# Patient Record
Sex: Male | Born: 1935 | Race: White | Hispanic: No | Marital: Married | Smoking: Former smoker
Health system: Southern US, Community
[De-identification: ages and names within clinical notes are randomized; demographics above are authoritative.]

## PROBLEM LIST (undated history)

## (undated) DIAGNOSIS — Z96619 Presence of unspecified artificial shoulder joint: Secondary | ICD-10-CM

## (undated) DIAGNOSIS — E785 Hyperlipidemia, unspecified: Secondary | ICD-10-CM

## (undated) DIAGNOSIS — N400 Enlarged prostate without lower urinary tract symptoms: Secondary | ICD-10-CM

## (undated) DIAGNOSIS — E039 Hypothyroidism, unspecified: Secondary | ICD-10-CM

## (undated) DIAGNOSIS — I219 Acute myocardial infarction, unspecified: Secondary | ICD-10-CM

## (undated) DIAGNOSIS — M109 Gout, unspecified: Secondary | ICD-10-CM

## (undated) DIAGNOSIS — I350 Nonrheumatic aortic (valve) stenosis: Secondary | ICD-10-CM

## (undated) DIAGNOSIS — I2510 Atherosclerotic heart disease of native coronary artery without angina pectoris: Secondary | ICD-10-CM

## (undated) DIAGNOSIS — R079 Chest pain, unspecified: Secondary | ICD-10-CM

## (undated) DIAGNOSIS — I10 Essential (primary) hypertension: Secondary | ICD-10-CM

## (undated) HISTORY — DX: Gout, unspecified: M10.9

## (undated) HISTORY — DX: Acute myocardial infarction, unspecified: I21.9

## (undated) HISTORY — DX: Hyperlipidemia, unspecified: E78.5

## (undated) HISTORY — PX: CORONARY ARTERY BYPASS GRAFT: SHX141

## (undated) HISTORY — DX: Presence of unspecified artificial shoulder joint: Z96.619

## (undated) HISTORY — DX: Benign prostatic hyperplasia without lower urinary tract symptoms: N40.0

## (undated) HISTORY — DX: Nonrheumatic aortic (valve) stenosis: I35.0

## (undated) HISTORY — PX: PROSTATE ABLATION: SHX6042

## (undated) HISTORY — DX: Chest pain, unspecified: R07.9

## (undated) HISTORY — DX: Atherosclerotic heart disease of native coronary artery without angina pectoris: I25.10

## (undated) HISTORY — DX: Hypothyroidism, unspecified: E03.9

## (undated) HISTORY — PX: TONSILLECTOMY: SUR1361

## (undated) HISTORY — DX: Essential (primary) hypertension: I10

## (undated) HISTORY — PX: AORTIC VALVE REPLACEMENT: SHX41

---

## 2007-03-08 ENCOUNTER — Ambulatory Visit: Payer: Self-pay | Admitting: Cardiothoracic Surgery

## 2007-04-19 ENCOUNTER — Ambulatory Visit: Payer: Self-pay | Admitting: Thoracic Surgery (Cardiothoracic Vascular Surgery)

## 2008-06-29 ENCOUNTER — Ambulatory Visit: Payer: Self-pay | Admitting: Internal Medicine

## 2008-06-29 ENCOUNTER — Inpatient Hospital Stay (HOSPITAL_COMMUNITY): Admission: EM | Admit: 2008-06-29 | Discharge: 2008-07-01 | Payer: Self-pay | Admitting: Emergency Medicine

## 2008-07-01 ENCOUNTER — Ambulatory Visit: Payer: Self-pay | Admitting: Vascular Surgery

## 2008-07-01 ENCOUNTER — Encounter (INDEPENDENT_AMBULATORY_CARE_PROVIDER_SITE_OTHER): Payer: Self-pay | Admitting: Internal Medicine

## 2011-03-30 NOTE — H&P (Signed)
NAME:  Peter Beltran, Peter Beltran NO.:  [id number]   MEDICAL RECORD NO.:  [id number]          PATIENT TYPE:  INP   LOCATION:  2010                         FACILITY:  [facility]   PHYSICIAN:  Joylene John, MD       DATE OF BIRTH:  23-Jan-1925   DATE OF ADMISSION:  06/29/2008  DATE OF DISCHARGE:                              HISTORY & PHYSICAL   The patient belongs to [facility].   CHIEF COMPLAINT:  Diplopia for last 2-3 days, more pronounced since  noontime today.   HISTORY OF PRESENT ILLNESS:  This is an 77 year old white male with a  history of hypertension, hyperlipidemia, CABG, and TIA, coming in with  complaints of diplopia which started about 2-3 days ago.  According to  the patient, at that time his lens of his glasses had been broken, so he  attributed diplopia to that.  However, the glasses were fixed today and  the diplopia still persisted.  The patient denies any similar symptoms  in the past.  The patient denies any nausea, vomiting, shortness of  breath, or chest pain.  He did feel a little dizzy earlier before he  came to the ER today, however, that has since resolved.  The patient  denied any focal neurological weakness of any of his extremities.  No  speech problems or swallowing problems.   PAST MEDICAL HISTORY:  1. Hypertension.  2. Hyperlipidemia.  3. CABG x3 per the patient.  4. Aortic valve replacement, last year.  5. History of TIA in the past.   SOCIAL HISTORY:  The patient used to smoke in the past, now has quit for  several years.  Social drinker.  No IV or recreational drugs.   SURGICAL HISTORY:  CABG and aortic valve replacement.   ALLERGIES:  DOXYCYCLINE, gets a rash.   MEDICATIONS:  The patient cannot recall the complete med list today,  however, he recalls some of the medications, but not sure about the  doses.  The medications include the following:  1. Plavix 75 mg once daily.  2. Lotensin 40 mg x2 tablets once a  day.  3. Lipitor 40 mg daily.  4. Thyroxine 1 tablet daily, not sure about the dose.  5. Requip 1 tablet daily, not sure about the dose.  6. Singulair 1 tablet daily, not sure of the dose.  7. Spironolactone 1 tablet daily, not sure of the dose.  8. Aspirin 81 mg 1 tablet daily.   REVIEW OF SYSTEMS:  Please refer to the HPI for review of system.  All  other 14-point review of system was negative.   PHYSICAL EXAMINATION:  VITAL SIGNS:  Temperature was 97.3, pulse was 65,  respirations 18, blood pressure was 157/79, and O2 sat was 99% on room  air.  GENERAL:  The patient is in no acute distress.  He is alert and oriented  x3.  There is no slurring of speech.  LUNGS:  Clear to auscultation.  CARDIOVASCULAR:  Regular rate and rhythm.  There is a 2/6 murmur in left  sternal border.  No radiation.  EXTREMITIES:  Lower  extremities, no edema appreciated.  NEUROLOGIC:  The cranial nerves are grossly intact.  There is no  nystagmus noted of the eyes when tested.  Upper and lower extremity  bilateral strength 5/5.  Also, the bilateral IOL reflex was intact upon  eye exam.  No focal neurological deficit found on exam.  Romberg sign  was negative.   </PERTINENT LABS and imaging>  White count of 5.5, hemoglobin 12.1, hematocrit 35.6, and platelets 191.  Sodium 135, potassium 4.9, chloride 105, bicarb 26, BUN 33, creatinine  1.2, glucose 95, and INR 1.  CT head showed chronic small vessel  disease.  No acute pathology appreciated.   ASSESSMENT AND PLAN:  This is an 77 year old white male with multiple  risk factors coming in with diplopia and a possible stroke with a  negative head imaging.  Plan is to admit the patient, put him on a tele  bed, and do serial cardiac enzymes to make sure this is not a cardiac  event.  We will continue the aspirin and Plavix.  We will start him on a  low-dose beta-blocker 25 q.12 h. with hold parameters.  We will also  order echo and Dopplers in the morning to  see if there is a source for  the stroke in the heart or the carotids.  The patient has been asked to  bring all his medications in the morning, so that doses can be confirmed  until then we will hold the Lotensin, thyroxine, Requip, Singulair, and  spironolactone.      Joylene John, MD  Electronically Signed     RP/MEDQ  D:  06/29/2008  T:  06/30/2008  Job:  [id number]

## 2011-04-02 NOTE — Discharge Summary (Signed)
NAME:  Peter Beltran, Peter Beltran NO.:  [id number]   MEDICAL RECORD NO.:  [id number]          PATIENT TYPE:  INP   LOCATION:  2010                         FACILITY:  [facility]   PHYSICIAN:  Deirdre Peer. Polite, M.D. DATE OF BIRTH:  11-13-1925   DATE OF ADMISSION:  06/29/2008  DATE OF DISCHARGE:  07/01/2008                               DISCHARGE SUMMARY   DISCHARGE DIAGNOSES:  1. Transient ischemic attack characterized diplopia.  2. Coronary artery disease status post coronary artery bypass graft in      2008 status post pulmonary vascular resistance with a pulmonary      problem.  3. Asthma.  4. Hypertension.  5. History of transient ischemic attack 2 years ago, followed by      Neurology at [facility].  6. High cholesterol.  7. Glaucoma.  8. Gout.  9. History of pelvic fracture in 2006.  10.History of testosterone deficiency.  11.Hearing loss.  12.Gastroesophageal reflux disease.  13.Hypothyroidism.  14.Moderate-to-severe aortic valve stenosis, mean transaortic valve      gradient 34, estimated aortic valve area was 0.98.   DISCHARGE MEDICATIONS:  1. Metoprolol 25 mg daily.  2. Lipitor 40 mg daily.  3. Xalatan solution.  4. Benazepril 40 mg daily.  5. Singulair 10 mg daily.  6. Proventil HFA p.r.n.  7. Spironolactone 25 mg daily.  8. Plavix 75 mg daily.  9. Allopurinol 20 mg daily.  10.Ropinirole 2-3 times a day.  11.AndroGel daily.  12.Synthroid 75 mcg.  13.Aspirin daily.   DISPOSITION:  The patient discharged to home in stable condition.  The  patient asked to follow up with his established cardiologist in [facility], and his established neurologist in [place].  The patient is  asked to follow up with his primary MD as well.   STUDIES:  2-D echo:  Overall LV systolic function was normal.  Findings  were consistent with moderate-to-severe aortic valve stenosis.  Main  transaortic valve gradient was 34 mmHg.  Estimated aortic valve area was  0.98.  MRI of  brain:  No acute intracranial abnormality, moderate  nonspecific cerebral white matter disease most commonly related to small  vessel ischemia.  MRA:  Moderate-to-severe stenosis of the right  posterior cerebral artery, incidental fetal origin with preserved distal  flow, otherwise unremarkable.  Posterior circulation, mild stenosis of  the right MCA origin, otherwise unremarkable.  CT of the head:  No acute  intracranial abnormality, evidence of advanced chronic small-vessel  ischemic disease.  Carotid ultrasound:  Bilateral moderate plaque, no  ICA stenosis.  Total cholesterol 173, HDL 27, CK normal, and troponin I  normal.   HISTORY OF PRESENT ILLNESS:  Elderly male presented to the ED with  complaint of diplopia times 2-3 days, his story seems to fluctuate  during the admission initially because his glasses would broke, however,  after getting his glasses fixed he still had blurred vision.  Because of  history of TIA, the patient presented to the ED for evaluation.  He  denied any chest pain or shortness of breath.  Please see H&P for  further details.   PAST  MEDICAL HISTORY:  As stated above including hypertension,  hyperlipidemia, CABG, aortic valve replacement, and history of TIA.   ADMISSION MEDICATIONS:  As stated above.   SOCIAL HISTORY:  Per admission H&P.   PAST SURGICAL HISTORY:  As stated in H&P.   HOSPITAL COURSE:  The patient was admitted to a telemetry floor bed for  evaluation and treatment for possible TIA.  The patient was without any  neuro deficits when presented to the ED or during the hospital.  His  neuro exam was within normal limits.  The patient has several studies as  stated above, CAT scan of the head, MRI, MRA, carotid Doppler, 2-D echo,  there was no arrhythmia noted on the monitor.  The patient's old records  were reviewed in the office.  There was not much change in this prior  carotid study.  Left carotid showed bifurcation and mild irregular   heterogeneously calcified plaque and his internal carotid artery has  mild irregular heterogeneously calcified plaque.  The right carotid  showed normal velocities, irregular heterogeneously calcified plaque.  The patient's previous echo also was reviewed which showed preserved EF.  At the time of the patient's physical in June 2009, he was asked to  follow up with his cardiologist and his neurologist in [facility], which  he states he has been doing.  As stated, the patient was admitted for  TIA MRI without any acute stroke.  The patient will continue current  meds.  He will continue with followup by his established neurologist and  cardiologist in [facility].      Deirdre Peer. Polite, M.D.  Electronically Signed     RDP/MEDQ  D:  07/02/2008  T:  07/03/2008  Job:  [id number]

## 2011-05-17 ENCOUNTER — Encounter: Payer: Self-pay | Admitting: Cardiology

## 2011-06-14 ENCOUNTER — Encounter (INDEPENDENT_AMBULATORY_CARE_PROVIDER_SITE_OTHER): Payer: Medicare Other

## 2011-06-14 DIAGNOSIS — I359 Nonrheumatic aortic valve disorder, unspecified: Secondary | ICD-10-CM

## 2011-06-15 NOTE — Consult Note (Signed)
[facility] NEW PATIENT CONSULTATION  Peter Beltran, Peter Beltran DOB:  Oct 15, 1925                                        June 14, 2011 CHART #:  [id number]  REQUESTING PHYSICIAN:  Montez Morita MD, [facility].  PRIMARY CARE PHYSICIAN:  Dr. Beckey Downing.  REASON FOR CONSULTATION:  Bioprosthetic aortic stenosis status port AVR in 2008.  CHIEF COMPLAINT:  Shortness of breath and chest pain with exertion.  HISTORY OF PRESENT ILLNESS:  I was asked to evaluate this 77 year old Caucasian male, nonsmoker for possible redo aortic valve replacement after being diagnosed with prosthetic aortic valve stenosis by transthoracic 2-D echo as well as transesophageal 2-D echo.  The patient presented with unstable angina in early 2008 and cardiac cath demonstrated severe three-vessel coronary artery disease with moderate aortic stenosis with aortic valve area of 0.9 and a gradient of 40 mmHg. The patient underwent aortic valve replacement with a 21-mm of Mitroflow bioprosthetic valve, which functioned normally on the intraoperative echo without aortic insufficiency and minimal transvalvular gradient. The LV outflow tract measures approximately 19 mm.  The patient also underwent simultaneous left IMA graft to the LAD and vein grafts to the circumflex marginal and posterior descending.  The patient did well for several years.  Approximately 2-3 months ago, he developed exertional symptoms of shortness breath and chest pain.  He currently uses a scooter to get around.  A 2-D echo performed in 2011 showed a mean gradient of 35 mmHg with aortic valve area of 1.6.  The most recent transthoracic echo now shows a mean gradient of 65 with an aortic valve area of 0.7.  Peak RV systolic pressure is currently 45 mmHg.  The patient denies any orthopnea or resting symptoms.  He denies syncope or palpitation.  He has had some dizziness.  If he watches his activity, he is asymptomatic.  He  can walk up a flight of stairs, but has some difficulty.  The patient underwent evaluation of this symptom complex and a stress test showed no evidence of ischemia using the Lexiscan nuclear medicine myocardial perfusion scan.  He presents now for evaluation of the situation.  Since the surgery, the patient has had no evidence of endocarditis or other significant medical events or admissions.  His LVEF by echo is on normal and there is no other significant valvular abnormalities, no evidence of mitral regurgitation, tricuspid insufficiency, or pericardial effusion.  CURRENT MEDICATIONS: 1. Allopurinol 300 mg a day. 2. Aspirin 81 mg a day. 3. Aleve 200 mg a day. 4. Clonazepam 1 mg nightly. 5. Neurontin 300 mg nightly. 6. Levothyroxine 75 mcg daily. 7. Metoprolol 50 mg daily. 8. Plavix 75 mg a day. 9. Spirolactone 25 mg a day. 10.Xalatan eye drops. 11.Omeprazole 20 mg a day.  ALLERGIES:  He develops a red rash from VANCOMYCIN and has myopathy with LIPITOR.  PAST MEDICAL HISTORY: 1. AVR/CABG in 2008. 2. Right shoulder replacement. 3. Appendectomy. 4. Hypertension. 5. Hypothyroidism. 6. Restless legs syndrome. 7. Hyperlipidemia.  SOCIAL HISTORY:  The patient is retired and lives with his wife.  He is a nonsmoker and does not drink alcohol.  He is sedentary due to his exertional-related symptoms, but uses a scooter.  REVIEW OF SYSTEMS:  Negative for fever or weight loss.  He is edentulous with total upper and lower dental plates.  He denies difficulty swallowing.  He denies any falls or thoracic trauma.  He denies GI bleeding.  He does have some BPH with mild symptoms.  PHYSICAL EXAMINATION:  VITAL SIGNS:  The patient is 5 feet and 6 inches and weighs 222 pounds.  Blood pressure 140/70.  Pulse 60 and regular. GENERAL APPEARANCE:  That of an elderly Caucasian male, appears somewhat younger than his stated age of 40 years. HEENT:  Normocephalic.  Pupils are equal.   Dentition is with plates. NECK:  Without JVD.  Carotid pulses are somewhat diminished. LYMPHATICS:  No palpable adenopathy. CHEST:  Thorax is without deformity with well-healed sternal incision. Breath sounds are clear and equally.  He has a soft systolic flow murmur through the aortic valve.  He is in sinus rhythm. ABDOMEN:  Soft and nontender. EXTREMITIES:  No clubbing, tenderness, or edema.  Peripheral pulses are intact. NEUROLOGIC:  Nonfocal.  LABORATORY DATA:  I reviewed his several echoes which have shown gradual increase in transvalvular aortic gradient and possible prosthetic valve deterioration.  He has mild aortic insufficiency.  PLAN:  I would advise that the patient undergo repeat right and left heart cath with a pullback pressure gradient across the aortic valve. The patient may need aortic valve replacement either with conventional repeat sternotomy or perhaps he would be a candidate for a transarterial aortic valve replacement (TAVR).  I will discuss the situation with Cardiology, Dr. Harriette Bouillon and arrange for his catheterization as the initial step.  Kerin Perna, M.D. Electronically Signed  PV/MEDQ  D:  06/14/2011  T:  06/15/2011  Job:  [id number]

## 2011-07-06 ENCOUNTER — Encounter (INDEPENDENT_AMBULATORY_CARE_PROVIDER_SITE_OTHER): Payer: Medicare Other

## 2011-07-06 DIAGNOSIS — I359 Nonrheumatic aortic valve disorder, unspecified: Secondary | ICD-10-CM

## 2011-07-27 DIAGNOSIS — I359 Nonrheumatic aortic valve disorder, unspecified: Secondary | ICD-10-CM

## 2011-08-24 ENCOUNTER — Encounter: Payer: Self-pay | Admitting: Surgery

## 2011-08-25 ENCOUNTER — Ambulatory Visit (INDEPENDENT_AMBULATORY_CARE_PROVIDER_SITE_OTHER): Payer: Medicare Other | Admitting: Thoracic Surgery (Cardiothoracic Vascular Surgery)

## 2011-08-25 DIAGNOSIS — I359 Nonrheumatic aortic valve disorder, unspecified: Secondary | ICD-10-CM

## 2011-08-25 DIAGNOSIS — Z952 Presence of prosthetic heart valve: Secondary | ICD-10-CM

## 2011-08-25 NOTE — Progress Notes (Signed)
He is doing quite well since discharge. His sternum is stable, wounds are well healed. His shortness of breath is quite improved. He has seen his cardiologist. He is walking for exercise and will begin heartstrides in a couple of weeks. Sternal precautions were reviewed. RTC PRN.

## 2012-08-15 DIAGNOSIS — R7881 Bacteremia: Secondary | ICD-10-CM

## 2012-08-15 HISTORY — DX: Bacteremia: R78.81

## 2013-04-21 DIAGNOSIS — R7301 Impaired fasting glucose: Secondary | ICD-10-CM | POA: Insufficient documentation

## 2013-04-21 DIAGNOSIS — J449 Chronic obstructive pulmonary disease, unspecified: Secondary | ICD-10-CM

## 2013-04-21 DIAGNOSIS — R1310 Dysphagia, unspecified: Secondary | ICD-10-CM

## 2013-04-21 DIAGNOSIS — M1990 Unspecified osteoarthritis, unspecified site: Secondary | ICD-10-CM | POA: Insufficient documentation

## 2013-04-21 DIAGNOSIS — K117 Disturbances of salivary secretion: Secondary | ICD-10-CM | POA: Insufficient documentation

## 2013-04-21 HISTORY — DX: Disturbances of salivary secretion: K11.7

## 2013-04-21 HISTORY — DX: Dysphagia, unspecified: R13.10

## 2013-04-21 HISTORY — DX: Chronic obstructive pulmonary disease, unspecified: J44.9

## 2013-04-21 HISTORY — DX: Impaired fasting glucose: R73.01

## 2013-04-21 HISTORY — DX: Unspecified osteoarthritis, unspecified site: M19.90

## 2013-05-12 ENCOUNTER — Emergency Department (HOSPITAL_BASED_OUTPATIENT_CLINIC_OR_DEPARTMENT_OTHER)
Admission: EM | Admit: 2013-05-12 | Discharge: 2013-05-12 | Disposition: A | Discharge status: Home / Self Care | Payer: Medicare Other | Attending: Emergency Medicine | Admitting: Emergency Medicine

## 2013-05-12 ENCOUNTER — Encounter (HOSPITAL_BASED_OUTPATIENT_CLINIC_OR_DEPARTMENT_OTHER): Payer: Self-pay | Admitting: *Deleted

## 2013-05-12 DIAGNOSIS — E039 Hypothyroidism, unspecified: Secondary | ICD-10-CM | POA: Insufficient documentation

## 2013-05-12 DIAGNOSIS — Z954 Presence of other heart-valve replacement: Secondary | ICD-10-CM | POA: Insufficient documentation

## 2013-05-12 DIAGNOSIS — Z7982 Long term (current) use of aspirin: Secondary | ICD-10-CM | POA: Insufficient documentation

## 2013-05-12 DIAGNOSIS — Z87448 Personal history of other diseases of urinary system: Secondary | ICD-10-CM | POA: Insufficient documentation

## 2013-05-12 DIAGNOSIS — Z96619 Presence of unspecified artificial shoulder joint: Secondary | ICD-10-CM | POA: Insufficient documentation

## 2013-05-12 DIAGNOSIS — G249 Dystonia, unspecified: Secondary | ICD-10-CM

## 2013-05-12 DIAGNOSIS — Z7901 Long term (current) use of anticoagulants: Secondary | ICD-10-CM | POA: Insufficient documentation

## 2013-05-12 DIAGNOSIS — M109 Gout, unspecified: Secondary | ICD-10-CM | POA: Insufficient documentation

## 2013-05-12 DIAGNOSIS — Z79899 Other long term (current) drug therapy: Secondary | ICD-10-CM | POA: Insufficient documentation

## 2013-05-12 DIAGNOSIS — Z8679 Personal history of other diseases of the circulatory system: Secondary | ICD-10-CM | POA: Insufficient documentation

## 2013-05-12 DIAGNOSIS — R259 Unspecified abnormal involuntary movements: Secondary | ICD-10-CM | POA: Insufficient documentation

## 2013-05-12 DIAGNOSIS — Z862 Personal history of diseases of the blood and blood-forming organs and certain disorders involving the immune mechanism: Secondary | ICD-10-CM | POA: Insufficient documentation

## 2013-05-12 DIAGNOSIS — Z8639 Personal history of other endocrine, nutritional and metabolic disease: Secondary | ICD-10-CM | POA: Insufficient documentation

## 2013-05-12 DIAGNOSIS — G2581 Restless legs syndrome: Secondary | ICD-10-CM | POA: Insufficient documentation

## 2013-05-12 DIAGNOSIS — IMO0002 Reserved for concepts with insufficient information to code with codable children: Secondary | ICD-10-CM | POA: Insufficient documentation

## 2013-05-12 DIAGNOSIS — I2510 Atherosclerotic heart disease of native coronary artery without angina pectoris: Secondary | ICD-10-CM | POA: Insufficient documentation

## 2013-05-12 DIAGNOSIS — I10 Essential (primary) hypertension: Secondary | ICD-10-CM | POA: Insufficient documentation

## 2013-05-12 MED ORDER — ZOLPIDEM TARTRATE 5 MG PO TABS: 5.0000 mg | ORAL_TABLET | Freq: Every evening | ORAL | Status: DC | PRN

## 2013-05-12 NOTE — ED Provider Notes (Signed)
History     79 year old male with restlessness. His sensation that he just cannot sit still. He feels like he is to be moving at all times. Symptoms are worse at night. Onto the symphysis about 2 days ago. We'll her symptoms is trying mouth. No recent medication changes. Patient did try stopping all his medications from his Coumadin thinking that his symptoms may be related. His son is in the past day. No appreciable change. No fevers or chills. No headaches. No acute numbness, tingling or loss of strength. No confusion per wife. Patient has a history restless leg syndrome, but states that current symptoms are much more severe and involve more than just legs.   CSN: [id number] Arrival date & time 05/12/13  1113  None    Chief Complaint  Patient presents with  . Spasms   (Consider location/radiation/quality/duration/timing/severity/associated sxs/prior Treatment) HPI Past Medical History  Diagnosis Date  . Aortic stenosis     post aortic valve replacement with St.Jude's valve by Dr.VanTrigt on 07-27-11  . Coronary artery disease     status post CABG x3 in 2008  . Hypertension   . Hypothyroidism   . Dyslipidemia   . Hyperplasia of prostate     benign  . Status post shoulder joint replacement     right shoulder  . Gout    Past Surgical History  Procedure Laterality Date  . Prostate ablation     No family history on file. History  Substance Use Topics  . Smoking status: Not on file  . Smokeless tobacco: Not on file  . Alcohol Use: Not on file    Review of Systems  All systems reviewed and negative, other than as noted in HPI.   Allergies  Lipitor and Vancomycin  Home Medications   Current Outpatient Rx  Name  Route  Sig  Dispense  Refill  . fluticasone-salmeterol (ADVAIR HFA) 115-21 MCG/ACT inhaler   Inhalation   Inhale 2 puffs into the lungs 2 (two) times daily.         Marland Kitchen testosterone (ANDROGEL) 50 MG/5GM GEL   Transdermal   Place 5 g onto the skin  daily.         Marland Kitchen allopurinol (ZYLOPRIM) 300 MG tablet   Oral   Take 300 mg by mouth daily.           Marland Kitchen aspirin 81 MG tablet   Oral   Take 81 mg by mouth daily.           . clonazePAM (KLONOPIN) 1 MG tablet   Oral   Take 1 mg by mouth at bedtime.           . Gabapentin (NEURONTIN PO)   Oral   Take 300 mg by mouth as needed.           Marland Kitchen levothyroxine (SYNTHROID) 75 MCG tablet   Oral   Take 75 mcg by mouth daily.           . metoprolol (LOPRESSOR) 50 MG tablet   Oral   Take 50 mg by mouth 2 (two) times daily.           Marland Kitchen omeprazole (PRILOSEC) 20 MG capsule   Oral   Take 20 mg by mouth daily.           Marland Kitchen rOPINIRole (REQUIP) 2 MG tablet      2 mg p.o. At dinner time and 4 mg p.o. At bedtime          .  spironolactone (ALDACTONE) 25 MG tablet   Oral   Take 25 mg by mouth daily.           . traMADol (ULTRAM) 50 MG tablet      1 or 2 every 4 hours as needed for pain          . warfarin (COUMADIN) 2.5 MG tablet      2 tablets p.o. daily or as directed           BP 171/87  Pulse 73  Temp(Src) 97.6 F (36.4 C) (Oral)  Resp 18  SpO2 98% Physical Exam  Nursing note and vitals reviewed. Constitutional: He is oriented to person, place, and time. He appears well-developed and well-nourished. No distress.  Standing beside bed pacing.   HENT:  Head: Normocephalic and atraumatic.  Eyes: Conjunctivae are normal. Right eye exhibits no discharge. Left eye exhibits no discharge.  Neck: Neck supple.  Cardiovascular: Normal rate, regular rhythm and normal heart sounds.  Exam reveals no gallop and no friction rub.   No murmur heard. Pulmonary/Chest: Effort normal and breath sounds normal. No respiratory distress.  Abdominal: Soft. He exhibits no distension. There is no tenderness.  Musculoskeletal: He exhibits no edema and no tenderness.  Neurological: He is alert and oriented to person, place, and time. No cranial nerve deficit. He exhibits normal  muscle tone. Coordination normal.  Mild resting tremor. No increased muscle tone. No cog wheeling. Biceps and patellar reflexes normal.   Skin: Skin is warm and dry. He is not diaphoretic.  Psychiatric: He has a normal mood and affect. His behavior is normal. Thought content normal.    ED Course  Procedures (including critical care time) Labs Reviewed - No data to display No results found. 1. Dystonic movements     MDM  79 year old male with dystonic muscle movements. Suspect medication related. Patient is taking her oral for restless leg syndrome. This can actually cause dystonic movements though as well as dry mouth. Recommended stopping for a few days. Double evening klonopin dose temporarily as well. Pt cautioned about sedating effects. Outpt FU.   Raeford Razor, MD 05/15/13 [id number]

## 2013-05-12 NOTE — ED Notes (Addendum)
Patient states that he cannot sit still. He cant sleep and he feels like he must be moving at all times. Takes medications to help him sleep but nothing is working. States that his mouth is drier than usual and that has more gas than usual.

## 2013-05-29 DIAGNOSIS — M109 Gout, unspecified: Secondary | ICD-10-CM | POA: Insufficient documentation

## 2013-05-29 DIAGNOSIS — I359 Nonrheumatic aortic valve disorder, unspecified: Secondary | ICD-10-CM | POA: Insufficient documentation

## 2013-05-29 DIAGNOSIS — N401 Enlarged prostate with lower urinary tract symptoms: Secondary | ICD-10-CM | POA: Insufficient documentation

## 2013-05-29 DIAGNOSIS — Z7901 Long term (current) use of anticoagulants: Secondary | ICD-10-CM | POA: Insufficient documentation

## 2013-05-29 HISTORY — DX: Long term (current) use of anticoagulants: Z79.01

## 2013-05-29 HISTORY — DX: Nonrheumatic aortic valve disorder, unspecified: I35.9

## 2013-05-29 HISTORY — DX: Benign prostatic hyperplasia with lower urinary tract symptoms: N40.1

## 2013-05-29 HISTORY — DX: Gout, unspecified: M10.9

## 2013-06-12 ENCOUNTER — Emergency Department (HOSPITAL_COMMUNITY)
Admission: EM | Admit: 2013-06-12 | Discharge: 2013-06-12 | Disposition: A | Discharge status: Home / Self Care | Payer: Medicare Other | Attending: Emergency Medicine | Admitting: Emergency Medicine

## 2013-06-12 ENCOUNTER — Encounter (HOSPITAL_COMMUNITY): Payer: Self-pay | Admitting: Emergency Medicine

## 2013-06-12 DIAGNOSIS — Z862 Personal history of diseases of the blood and blood-forming organs and certain disorders involving the immune mechanism: Secondary | ICD-10-CM | POA: Insufficient documentation

## 2013-06-12 DIAGNOSIS — Z8679 Personal history of other diseases of the circulatory system: Secondary | ICD-10-CM | POA: Insufficient documentation

## 2013-06-12 DIAGNOSIS — Z7982 Long term (current) use of aspirin: Secondary | ICD-10-CM | POA: Insufficient documentation

## 2013-06-12 DIAGNOSIS — Z951 Presence of aortocoronary bypass graft: Secondary | ICD-10-CM | POA: Insufficient documentation

## 2013-06-12 DIAGNOSIS — Z79899 Other long term (current) drug therapy: Secondary | ICD-10-CM | POA: Insufficient documentation

## 2013-06-12 DIAGNOSIS — G249 Dystonia, unspecified: Secondary | ICD-10-CM

## 2013-06-12 DIAGNOSIS — I10 Essential (primary) hypertension: Secondary | ICD-10-CM | POA: Insufficient documentation

## 2013-06-12 DIAGNOSIS — R259 Unspecified abnormal involuntary movements: Secondary | ICD-10-CM | POA: Insufficient documentation

## 2013-06-12 DIAGNOSIS — Z87448 Personal history of other diseases of urinary system: Secondary | ICD-10-CM | POA: Insufficient documentation

## 2013-06-12 DIAGNOSIS — I2510 Atherosclerotic heart disease of native coronary artery without angina pectoris: Secondary | ICD-10-CM | POA: Insufficient documentation

## 2013-06-12 DIAGNOSIS — E039 Hypothyroidism, unspecified: Secondary | ICD-10-CM | POA: Insufficient documentation

## 2013-06-12 DIAGNOSIS — Z7901 Long term (current) use of anticoagulants: Secondary | ICD-10-CM | POA: Insufficient documentation

## 2013-06-12 DIAGNOSIS — Z96619 Presence of unspecified artificial shoulder joint: Secondary | ICD-10-CM | POA: Insufficient documentation

## 2013-06-12 DIAGNOSIS — Z8639 Personal history of other endocrine, nutritional and metabolic disease: Secondary | ICD-10-CM | POA: Insufficient documentation

## 2013-06-12 DIAGNOSIS — M109 Gout, unspecified: Secondary | ICD-10-CM | POA: Insufficient documentation

## 2013-06-12 LAB — CBC WITH DIFFERENTIAL/PLATELET
Eosinophils Absolute: 0.5 10*3/uL (ref 0.0–0.7)
Eosinophils Relative: 7 % — ABNORMAL HIGH (ref 0–5)
HCT: 39 % (ref 39.0–52.0)
Lymphs Abs: 2 10*3/uL (ref 0.7–4.0)
MCH: 28.1 pg (ref 26.0–34.0)
MCV: 87.1 fL (ref 78.0–100.0)
Monocytes Absolute: 0.7 10*3/uL (ref 0.1–1.0)
Monocytes Relative: 10 % (ref 3–12)
Platelets: 154 10*3/uL (ref 150–400)
RBC: 4.48 MIL/uL (ref 4.22–5.81)

## 2013-06-12 LAB — COMPREHENSIVE METABOLIC PANEL
BUN: 19 mg/dL (ref 6–23)
CO2: 24 mEq/L (ref 19–32)
Calcium: 9.2 mg/dL (ref 8.4–10.5)
Creatinine, Ser: 0.89 mg/dL (ref 0.50–1.35)
GFR calc Af Amer: 87 mL/min — ABNORMAL LOW (ref >90)
GFR calc non Af Amer: 75 mL/min — ABNORMAL LOW (ref >90)
Glucose, Bld: 103 mg/dL — ABNORMAL HIGH (ref 70–99)
Sodium: 139 mEq/L (ref 135–145)
Total Protein: 6.7 g/dL (ref 6.0–8.3)

## 2013-06-12 NOTE — ED Provider Notes (Signed)
CSN: [id number]     Arrival date & time 06/12/13  0428 History     First MD Initiated Contact with Patient 06/12/13 [phone]     Chief Complaint  Patient presents with  . Spasms   (Consider location/radiation/quality/duration/timing/severity/associated sxs/prior Treatment) HPI Comments: Peter Beltran is a 79 yo male with a PMH of HTN, CAD, RLS/Periodic Limb Movement Disorder who presents for a 5 hour history of increased restlessness/limb movement.  His wife is present for the entire H&P and provided some of the history.  He was recently seen at [facility] for the same complaint.  He otherwise has no complaints and denies headache, fever, CP/SOB or dysuria.     Past Medical History  Diagnosis Date  . Aortic stenosis     post aortic valve replacement with St.Jude's valve by Dr.VanTrigt on 07-27-11  . Coronary artery disease     status post CABG x3 in 2008  . Hypertension   . Hypothyroidism   . Dyslipidemia   . Hyperplasia of prostate     benign  . Status post shoulder joint replacement     right shoulder  . Gout    Past Surgical History  Procedure Laterality Date  . Prostate ablation     History reviewed. No pertinent family history. History  Substance Use Topics  . Smoking status: Not on file  . Smokeless tobacco: Not on file  . Alcohol Use: Not on file    Review of Systems  Constitutional: Negative for fever and appetite change.  Respiratory: Negative for shortness of breath.   Cardiovascular: Negative for chest pain.  Gastrointestinal: Negative for diarrhea, constipation and blood in stool.  Genitourinary: Negative for dysuria and hematuria.  Psychiatric/Behavioral: Negative for agitation. The patient is hyperactive. The patient is not nervous/anxious.     Allergies  Lipitor and Vancomycin  Home Medications   Current Outpatient Rx  Name  Route  Sig  Dispense  Refill  . allopurinol (ZYLOPRIM) 300 MG tablet   Oral   Take 300 mg by mouth daily.           Marland Kitchen aspirin 81 MG tablet   Oral   Take 81 mg by mouth daily.           . clonazePAM (KLONOPIN) 1 MG tablet   Oral   Take 1 mg by mouth at bedtime.           . fluticasone-salmeterol (ADVAIR HFA) 115-21 MCG/ACT inhaler   Inhalation   Inhale 2 puffs into the lungs 2 (two) times daily.         Marland Kitchen levothyroxine (SYNTHROID) 75 MCG tablet   Oral   Take 75 mcg by mouth daily.           . Melatonin 10 MG CAPS   Oral   Take 10 mg by mouth at bedtime.         . metoprolol (LOPRESSOR) 50 MG tablet   Oral   Take 50 mg by mouth daily.          Marland Kitchen omeprazole (PRILOSEC) 20 MG capsule   Oral   Take 20 mg by mouth daily.           Marland Kitchen rOPINIRole (REQUIP) 2 MG tablet      2 mg p.o. At dinner time and 4 mg p.o. At bedtime          . spironolactone (ALDACTONE) 25 MG tablet   Oral   Take 25 mg by mouth  daily.           . testosterone (ANDROGEL) 50 MG/5GM GEL   Transdermal   Place 5 g onto the skin daily.         . traMADol (ULTRAM) 50 MG tablet      1 or 2 every 4 hours as needed for pain          . warfarin (COUMADIN) 5 MG tablet   Oral   Take 5 mg by mouth daily.          BP 159/97  Pulse 69  Temp(Src) 97.8 F (36.6 C) (Oral)  Resp 20  SpO2 97% Physical Exam  Constitutional: He is oriented to person, place, and time. He appears well-developed and well-nourished. No distress.  HENT:  Mouth/Throat: Oropharynx is clear and moist.  Eyes: Pupils are equal, round, and reactive to light.  Pinpoint pupils  Neck: Neck supple.  Cardiovascular: Normal rate, regular rhythm and normal heart sounds.   Pulmonary/Chest: Effort normal and breath sounds normal.  Abdominal: Soft. Bowel sounds are normal. He exhibits no distension. There is no tenderness. There is no guarding.  Musculoskeletal: He exhibits no edema and no tenderness.  Restless activity. Moving extremities around in the bed.  Neurological: He is alert and oriented to person, place, and time. No  cranial nerve deficit.  Normal gait  Psychiatric: He has a normal mood and affect. His behavior is normal.    ED Course   Procedures (including critical care time)  Labs Reviewed - No data to display No results found. No diagnosis found.  MDM  79 yo with RLS/periodic limb movement here with increasing upper and lower extremity movement since early this AM.    Will obtain CBC, CMP and consult neurology.   Pt symptoms have resolved in the ED.  Dr. Roseanne Reno (neurology) suggests increasing Klonopin dose for 1mg  to 1.5mg .  Awaiting labs and likely discharge if normal.  Pt says he usually only take 0.5mg  Klonopin so will increase to 1mg .   He was instructed to follow up with Dr. Alton Revere (his neurologist) at his scheduled appointment next week (Aug 5).  He was instructed to return to the ED with increased symptoms.  Evelena Peat, DO 06/12/13 1107

## 2013-06-12 NOTE — ED Provider Notes (Signed)
I saw and evaluated the patient, reviewed the resident's note and I agree with the findings and plan.   .Face to face Exam:  General:  Awake HEENT:  Atraumatic Resp:  Normal effort Abd:  Nondistended Neuro:No focal weakness  Nelia Shi, MD 06/12/13 1112

## 2013-06-12 NOTE — ED Notes (Signed)
Pt resting quietly at the time. Vital signs stable. He is alert and answering questions appropriately at the time. Pt updated on plan of care. Wife at bedside.

## 2013-06-12 NOTE — ED Notes (Signed)
Pt requesting some food. Breakfast ordered.

## 2013-06-12 NOTE — ED Notes (Signed)
Pt resting quietly at the time. Wife states recent leg spasms and difficulty sleeping at night. No other complaints at the time. Pt is alert, denies pain at the time.

## 2013-06-12 NOTE — ED Notes (Signed)
Brought in by EMS from an independent condo living facility with "jerking" he was seen at [facility] last night and was upset because he said they didn't do anything for him

## 2013-06-14 DIAGNOSIS — R55 Syncope and collapse: Secondary | ICD-10-CM | POA: Insufficient documentation

## 2013-06-14 DIAGNOSIS — G4710 Hypersomnia, unspecified: Secondary | ICD-10-CM | POA: Insufficient documentation

## 2013-06-14 HISTORY — DX: Hypersomnia, unspecified: G47.10

## 2013-06-14 HISTORY — DX: Syncope and collapse: R55

## 2013-08-26 DIAGNOSIS — G2581 Restless legs syndrome: Secondary | ICD-10-CM

## 2013-08-26 DIAGNOSIS — G4761 Periodic limb movement disorder: Secondary | ICD-10-CM | POA: Insufficient documentation

## 2013-08-26 DIAGNOSIS — IMO0001 Reserved for inherently not codable concepts without codable children: Secondary | ICD-10-CM

## 2013-08-26 HISTORY — DX: Periodic limb movement disorder: G47.61

## 2013-08-26 HISTORY — DX: Restless legs syndrome: G25.81

## 2013-08-26 HISTORY — DX: Reserved for inherently not codable concepts without codable children: IMO0001

## 2013-09-17 DIAGNOSIS — F419 Anxiety disorder, unspecified: Secondary | ICD-10-CM | POA: Insufficient documentation

## 2013-09-17 DIAGNOSIS — F411 Generalized anxiety disorder: Secondary | ICD-10-CM | POA: Insufficient documentation

## 2013-09-17 HISTORY — DX: Generalized anxiety disorder: F41.1

## 2013-09-17 HISTORY — DX: Anxiety disorder, unspecified: F41.9

## 2013-11-28 DIAGNOSIS — G4752 REM sleep behavior disorder: Secondary | ICD-10-CM | POA: Insufficient documentation

## 2013-11-28 HISTORY — DX: REM sleep behavior disorder: G47.52

## 2013-12-17 DIAGNOSIS — G4733 Obstructive sleep apnea (adult) (pediatric): Secondary | ICD-10-CM | POA: Insufficient documentation

## 2013-12-17 HISTORY — DX: Obstructive sleep apnea (adult) (pediatric): G47.33

## 2014-01-28 DIAGNOSIS — L409 Psoriasis, unspecified: Secondary | ICD-10-CM | POA: Insufficient documentation

## 2014-01-28 HISTORY — DX: Psoriasis, unspecified: L40.9

## 2014-02-26 DIAGNOSIS — K219 Gastro-esophageal reflux disease without esophagitis: Secondary | ICD-10-CM

## 2014-02-26 HISTORY — DX: Gastro-esophageal reflux disease without esophagitis: K21.9

## 2014-04-04 DIAGNOSIS — G629 Polyneuropathy, unspecified: Secondary | ICD-10-CM

## 2014-04-04 HISTORY — DX: Polyneuropathy, unspecified: G62.9

## 2014-12-10 DIAGNOSIS — D2310 Other benign neoplasm of skin of unspecified eyelid, including canthus: Secondary | ICD-10-CM | POA: Insufficient documentation

## 2014-12-10 DIAGNOSIS — Z961 Presence of intraocular lens: Secondary | ICD-10-CM | POA: Insufficient documentation

## 2014-12-10 HISTORY — DX: Presence of intraocular lens: Z96.1

## 2014-12-10 HISTORY — DX: Other benign neoplasm of skin of unspecified eyelid, including canthus: D23.10

## 2015-04-30 DIAGNOSIS — M48061 Spinal stenosis, lumbar region without neurogenic claudication: Secondary | ICD-10-CM | POA: Insufficient documentation

## 2015-04-30 HISTORY — DX: Spinal stenosis, lumbar region without neurogenic claudication: M48.061

## 2015-05-27 ENCOUNTER — Emergency Department (HOSPITAL_BASED_OUTPATIENT_CLINIC_OR_DEPARTMENT_OTHER)
Admission: EM | Admit: 2015-05-27 | Discharge: 2015-05-27 | Disposition: A | Discharge status: Home / Self Care | Payer: Medicare Other | Attending: Emergency Medicine | Admitting: Emergency Medicine

## 2015-05-27 ENCOUNTER — Other Ambulatory Visit: Payer: Self-pay

## 2015-05-27 ENCOUNTER — Emergency Department (HOSPITAL_BASED_OUTPATIENT_CLINIC_OR_DEPARTMENT_OTHER): Payer: Medicare Other

## 2015-05-27 ENCOUNTER — Encounter (HOSPITAL_BASED_OUTPATIENT_CLINIC_OR_DEPARTMENT_OTHER): Payer: Self-pay

## 2015-05-27 DIAGNOSIS — R55 Syncope and collapse: Secondary | ICD-10-CM | POA: Insufficient documentation

## 2015-05-27 DIAGNOSIS — Z8639 Personal history of other endocrine, nutritional and metabolic disease: Secondary | ICD-10-CM | POA: Insufficient documentation

## 2015-05-27 DIAGNOSIS — Z87891 Personal history of nicotine dependence: Secondary | ICD-10-CM | POA: Insufficient documentation

## 2015-05-27 DIAGNOSIS — I10 Essential (primary) hypertension: Secondary | ICD-10-CM | POA: Diagnosis not present

## 2015-05-27 DIAGNOSIS — Z7901 Long term (current) use of anticoagulants: Secondary | ICD-10-CM | POA: Insufficient documentation

## 2015-05-27 DIAGNOSIS — Z954 Presence of other heart-valve replacement: Secondary | ICD-10-CM | POA: Insufficient documentation

## 2015-05-27 DIAGNOSIS — Z96611 Presence of right artificial shoulder joint: Secondary | ICD-10-CM | POA: Insufficient documentation

## 2015-05-27 DIAGNOSIS — Z951 Presence of aortocoronary bypass graft: Secondary | ICD-10-CM | POA: Diagnosis not present

## 2015-05-27 DIAGNOSIS — M109 Gout, unspecified: Secondary | ICD-10-CM | POA: Diagnosis not present

## 2015-05-27 DIAGNOSIS — Z87438 Personal history of other diseases of male genital organs: Secondary | ICD-10-CM | POA: Insufficient documentation

## 2015-05-27 DIAGNOSIS — I2510 Atherosclerotic heart disease of native coronary artery without angina pectoris: Secondary | ICD-10-CM | POA: Insufficient documentation

## 2015-05-27 DIAGNOSIS — Z7982 Long term (current) use of aspirin: Secondary | ICD-10-CM | POA: Insufficient documentation

## 2015-05-27 DIAGNOSIS — Z79899 Other long term (current) drug therapy: Secondary | ICD-10-CM | POA: Diagnosis not present

## 2015-05-27 DIAGNOSIS — R42 Dizziness and giddiness: Secondary | ICD-10-CM | POA: Diagnosis not present

## 2015-05-27 LAB — PROTIME-INR
INR: 2.44 — AB (ref 0.00–1.49)
PROTHROMBIN TIME: 26.2 s — AB (ref 11.6–15.2)

## 2015-05-27 LAB — URINALYSIS, ROUTINE W REFLEX MICROSCOPIC
Bilirubin Urine: NEGATIVE
Glucose, UA: NEGATIVE mg/dL
Hgb urine dipstick: NEGATIVE
KETONES UR: NEGATIVE mg/dL
Leukocytes, UA: NEGATIVE
NITRITE: NEGATIVE
PROTEIN: NEGATIVE mg/dL
Specific Gravity, Urine: 1.018 (ref 1.005–1.030)
Urobilinogen, UA: 1 mg/dL (ref 0.0–1.0)
pH: 6 (ref 5.0–8.0)

## 2015-05-27 LAB — BASIC METABOLIC PANEL
Anion gap: 7 (ref 5–15)
BUN: 23 mg/dL — ABNORMAL HIGH (ref 6–20)
CALCIUM: 8.9 mg/dL (ref 8.9–10.3)
CO2: 23 mmol/L (ref 22–32)
Chloride: 104 mmol/L (ref 101–111)
Creatinine, Ser: 0.92 mg/dL (ref 0.61–1.24)
GFR calc Af Amer: >60 mL/min (ref >60)
GFR calc non Af Amer: >60 mL/min (ref >60)
GLUCOSE: 110 mg/dL — AB (ref 65–99)
POTASSIUM: 4.4 mmol/L (ref 3.5–5.1)
SODIUM: 134 mmol/L — AB (ref 135–145)

## 2015-05-27 LAB — TROPONIN I: Troponin I: <0.03 ng/mL (ref <0.031)

## 2015-05-27 LAB — CBC
HEMATOCRIT: 38.1 % — AB (ref 39.0–52.0)
HEMOGLOBIN: 13 g/dL (ref 13.0–17.0)
MCH: 30.9 pg (ref 26.0–34.0)
MCHC: 34.1 g/dL (ref 30.0–36.0)
MCV: 90.5 fL (ref 78.0–100.0)
PLATELETS: 197 10*3/uL (ref 150–400)
RBC: 4.21 MIL/uL — AB (ref 4.22–5.81)
RDW: 15.2 % (ref 11.5–15.5)
WBC: 6.2 10*3/uL (ref 4.0–10.5)

## 2015-05-27 NOTE — ED Notes (Signed)
Urinal provided.

## 2015-05-27 NOTE — Discharge Instructions (Signed)
Follow-up with your doctor as scheduled in 2 days to discuss further testing.   Dizziness Dizziness is a common problem. It is a feeling of unsteadiness or light-headedness. You may feel like you are about to faint. Dizziness can lead to injury if you stumble or fall. A person of any age group can suffer from dizziness, but dizziness is more common in older adults. CAUSES  Dizziness can be caused by many different things, including:  Middle ear problems.  Standing for too long.  Infections.  An allergic reaction.  Aging.  An emotional response to something, such as the sight of blood.  Side effects of medicines.  Tiredness.  Problems with circulation or blood pressure.  Excessive use of alcohol or medicines, or illegal drug use.  Breathing too fast (hyperventilation).  An irregular heart rhythm (arrhythmia).  A low red blood cell count (anemia).  Pregnancy.  Vomiting, diarrhea, fever, or other illnesses that cause body fluid loss (dehydration).  Diseases or conditions such as Parkinson's disease, high blood pressure (hypertension), diabetes, and thyroid problems.  Exposure to extreme heat. DIAGNOSIS  Your health care provider will ask about your symptoms, perform a physical exam, and perform an electrocardiogram (ECG) to record the electrical activity of your heart. Your health care provider may also perform other heart or blood tests to determine the cause of your dizziness. These may include:  Transthoracic echocardiogram (TTE). During echocardiography, sound waves are used to evaluate how blood flows through your heart.  Transesophageal echocardiogram (TEE).  Cardiac monitoring. This allows your health care provider to monitor your heart rate and rhythm in real time.  Holter monitor. This is a portable device that records your heartbeat and can help diagnose heart arrhythmias. It allows your health care provider to track your heart activity for several days if  needed.  Stress tests by exercise or by giving medicine that makes the heart beat faster. TREATMENT  Treatment of dizziness depends on the cause of your symptoms and can vary greatly. HOME CARE INSTRUCTIONS   Drink enough fluids to keep your urine clear or pale yellow. This is especially important in very hot weather. In older adults, it is also important in cold weather.  Take your medicine exactly as directed if your dizziness is caused by medicines. When taking blood pressure medicines, it is especially important to get up slowly.  Rise slowly from chairs and steady yourself until you feel okay.  In the morning, first sit up on the side of the bed. When you feel okay, stand slowly while holding onto something until you know your balance is fine.  Move your legs often if you need to stand in one place for a long time. Tighten and relax your muscles in your legs while standing.  Have someone stay with you for 1-2 days if dizziness continues to be a problem. Do this until you feel you are well enough to stay alone. Have the person call your health care provider if he or she notices changes in you that are concerning.  Do not drive or use heavy machinery if you feel dizzy.  Do not drink alcohol. SEEK IMMEDIATE MEDICAL CARE IF:   Your dizziness or light-headedness gets worse.  You feel nauseous or vomit.  You have problems talking, walking, or using your arms, hands, or legs.  You feel weak.  You are not thinking clearly or you have trouble forming sentences. It may take a friend or family member to notice this.  You have chest  pain, abdominal pain, shortness of breath, or sweating.  Your vision changes.  You notice any bleeding.  You have side effects from medicine that seems to be getting worse rather than better. MAKE SURE YOU:   Understand these instructions.  Will watch your condition.  Will get help right away if you are not doing well or get worse. Document  Released: 04/27/2001 Document Revised: 11/06/2013 Document Reviewed: 05/21/2011 [facility] Patient Information 2015 [place], [place]. This information is not intended to replace advice given to you by your health care provider. Make sure you discuss any questions you have with your health care provider.

## 2015-05-27 NOTE — ED Notes (Signed)
C/o dizziness x 6-8 months-pt states he was having a "walking breathing test" today-increase in dizziness after walking today-has also been seen by cards for c/o

## 2015-05-27 NOTE — ED Provider Notes (Signed)
CSN: [id number]     Arrival date & time 05/27/15  1430 History   First MD Initiated Contact with Patient 05/27/15 1523     Chief Complaint  Patient presents with  . Dizziness     (Consider location/radiation/quality/duration/timing/severity/associated sxs/prior Treatment) HPI Comments: Patient is an 81 year old male with history of CABG, aortic valve replacement on Coumadin. He presents for evaluation of worsening dizziness over the past 6 months. He states this occurs with exertion. Whenever he stands to walk he develops lightheadedness, "tunnel vision", and feels as if he is going to pass out. This is become more frequent and occurs with lesser exertion. Today he had what was described as a walking test by his pulmonologist. After walking for 6 minutes, he became lightheaded and again felt as though he was going to pass out. He denies chest pain. He denies headache.  Patient is a 81 y.o. male presenting with dizziness. The history is provided by the patient.  Dizziness Quality:  Lightheadedness Severity:  Moderate Onset quality:  Sudden Timing:  Intermittent Chronicity:  New Context comment:  Exertion Relieved by: Rest. Exacerbated by: Exertion. Ineffective treatments:  None tried   Past Medical History  Diagnosis Date  . Aortic stenosis     post aortic valve replacement with St.Jude's valve by Dr.VanTrigt on 07-27-11  . Coronary artery disease     status post CABG x3 in 2008  . Hypertension   . Hypothyroidism   . Dyslipidemia   . Hyperplasia of prostate     benign  . Status post shoulder joint replacement     right shoulder  . Gout    Past Surgical History  Procedure Laterality Date  . Prostate ablation    . Coronary artery bypass graft    . Aortic valve replacement    . Tonsillectomy     No family history on file. History  Substance Use Topics  . Smoking status: Former Research scientist (life sciences)  . Smokeless tobacco: Not on file  . Alcohol Use: No    Review of Systems   Neurological: Positive for dizziness.  All other systems reviewed and are negative.     Allergies  Lipitor and Vancomycin  Home Medications   Prior to Admission medications   Medication Sig Start Date End Date Taking? Authorizing Provider  IRON PO Take by mouth.   Yes Historical Provider, MD  allopurinol (ZYLOPRIM) 300 MG tablet Take 300 mg by mouth daily.      Historical Provider, MD  aspirin 81 MG tablet Take 81 mg by mouth daily.      Historical Provider, MD  clonazePAM (KLONOPIN) 1 MG tablet Take 1 mg by mouth at bedtime.      Historical Provider, MD  fluticasone-salmeterol (ADVAIR HFA) 115-21 MCG/ACT inhaler Inhale 2 puffs into the lungs 2 (two) times daily.    Historical Provider, MD  Melatonin 10 MG CAPS Take 10 mg by mouth at bedtime.    Historical Provider, MD  metoprolol (LOPRESSOR) 50 MG tablet Take 50 mg by mouth daily.     Historical Provider, MD  omeprazole (PRILOSEC) 20 MG capsule Take 20 mg by mouth daily.      Historical Provider, MD  rOPINIRole (REQUIP) 2 MG tablet 2 mg p.o. At dinner time and 4 mg p.o. At bedtime     Historical Provider, MD  spironolactone (ALDACTONE) 25 MG tablet Take 25 mg by mouth daily.      Historical Provider, MD  traMADol (ULTRAM) 50 MG tablet 1 or 2 every  4 hours as needed for pain     Historical Provider, MD  warfarin (COUMADIN) 5 MG tablet Take 5 mg by mouth daily.    Historical Provider, MD   BP 178/72 mmHg  Pulse 70  Temp(Src) 97.6 F (36.4 C) (Oral)  Resp 14  Ht 5' 7.75" (1.721 m)  Wt 220 lb 12.8 oz (100.154 kg)  BMI 33.81 kg/m2  SpO2 98% Physical Exam  Constitutional: He is oriented to person, place, and time. He appears well-developed and well-nourished. No distress.  HENT:  Head: Normocephalic and atraumatic.  Mouth/Throat: Oropharynx is clear and moist.  Eyes: EOM are normal. Pupils are equal, round, and reactive to light.  Neck: Normal range of motion. Neck supple.  Cardiovascular: Normal rate, regular rhythm and normal  heart sounds.   No murmur heard. Pulmonary/Chest: Effort normal and breath sounds normal. No respiratory distress. He has no wheezes.  Abdominal: Soft. Bowel sounds are normal. He exhibits no distension. There is no tenderness.  Musculoskeletal: Normal range of motion. He exhibits no edema.  Lymphadenopathy:    He has no cervical adenopathy.  Neurological: He is alert and oriented to person, place, and time. No cranial nerve deficit. He exhibits normal muscle tone. Coordination normal.  Skin: Skin is warm and dry. He is not diaphoretic.  Nursing note and vitals reviewed.   ED Course  Procedures (including critical care time) Labs Review Labs Reviewed  BASIC METABOLIC PANEL - Abnormal; Notable for the following:    Sodium 134 (*)    Glucose, Bld 110 (*)    BUN 23 (*)    All other components within normal limits  CBC - Abnormal; Notable for the following:    RBC 4.21 (*)    HCT 38.1 (*)    All other components within normal limits  URINALYSIS, ROUTINE W REFLEX MICROSCOPIC (NOT AT [facility])  TROPONIN I  PROTIME-INR  CBG MONITORING, ED    Imaging Review No results found.   EKG Interpretation   Date/Time:  Tuesday May 27 2015 14:49:50 EDT Ventricular Rate:  70 PR Interval:  168 QRS Duration: 148 QT Interval:  440 QTC Calculation: 475 R Axis:   -60 Text Interpretation:  Normal sinus rhythm Left axis deviation Right bundle  branch block Abnormal ECG Confirmed by Ryin Schillo  MD, [place] ([id number]) on  05/27/2015 3:40:08 PM      MDM   Final diagnoses:  None    Workup shows no significant abnormality in the EKG, laboratory studies, troponin, or CT of the head. Patient appears comfortable and is neurologically intact. He is having an accelerating pattern of dizziness that occurs with exertion, the etiology of which I am uncertain. The patient is adamant that this has something to do with blood flow to his brain. I reviewed the carotid studies he had done last week which revealed  stenoses at the bifurcation bilaterally of 50% with no significant blockage of blood flow. He also has patent vertebral arteries. I see nothing at this point that requires admission. My advice to the patient will be to follow-up in 2 days as scheduled with his primary Dr. to discuss further studies.    Veryl Speak, MD 05/27/15 [phone]

## 2015-05-27 NOTE — ED Notes (Signed)
Report received from [place], [place] care assumed

## 2015-06-27 ENCOUNTER — Encounter: Payer: Self-pay | Admitting: Cardiology

## 2015-10-11 ENCOUNTER — Emergency Department (HOSPITAL_BASED_OUTPATIENT_CLINIC_OR_DEPARTMENT_OTHER): Payer: Medicare Other

## 2015-10-11 ENCOUNTER — Encounter (HOSPITAL_BASED_OUTPATIENT_CLINIC_OR_DEPARTMENT_OTHER): Payer: Self-pay | Admitting: Emergency Medicine

## 2015-10-11 ENCOUNTER — Emergency Department (HOSPITAL_BASED_OUTPATIENT_CLINIC_OR_DEPARTMENT_OTHER)
Admission: EM | Admit: 2015-10-11 | Discharge: 2015-10-11 | Disposition: A | Discharge status: Home / Self Care | Payer: Medicare Other | Attending: Emergency Medicine | Admitting: Emergency Medicine

## 2015-10-11 DIAGNOSIS — J690 Pneumonitis due to inhalation of food and vomit: Secondary | ICD-10-CM | POA: Diagnosis not present

## 2015-10-11 DIAGNOSIS — D72829 Elevated white blood cell count, unspecified: Secondary | ICD-10-CM | POA: Insufficient documentation

## 2015-10-11 DIAGNOSIS — Z79899 Other long term (current) drug therapy: Secondary | ICD-10-CM | POA: Insufficient documentation

## 2015-10-11 DIAGNOSIS — Z7901 Long term (current) use of anticoagulants: Secondary | ICD-10-CM | POA: Diagnosis not present

## 2015-10-11 DIAGNOSIS — Z7951 Long term (current) use of inhaled steroids: Secondary | ICD-10-CM | POA: Insufficient documentation

## 2015-10-11 DIAGNOSIS — Z87891 Personal history of nicotine dependence: Secondary | ICD-10-CM | POA: Diagnosis not present

## 2015-10-11 DIAGNOSIS — Z96619 Presence of unspecified artificial shoulder joint: Secondary | ICD-10-CM | POA: Insufficient documentation

## 2015-10-11 DIAGNOSIS — Z8639 Personal history of other endocrine, nutritional and metabolic disease: Secondary | ICD-10-CM | POA: Insufficient documentation

## 2015-10-11 DIAGNOSIS — R140 Abdominal distension (gaseous): Secondary | ICD-10-CM | POA: Insufficient documentation

## 2015-10-11 DIAGNOSIS — M109 Gout, unspecified: Secondary | ICD-10-CM | POA: Diagnosis not present

## 2015-10-11 DIAGNOSIS — I10 Essential (primary) hypertension: Secondary | ICD-10-CM | POA: Insufficient documentation

## 2015-10-11 DIAGNOSIS — I2510 Atherosclerotic heart disease of native coronary artery without angina pectoris: Secondary | ICD-10-CM | POA: Diagnosis not present

## 2015-10-11 DIAGNOSIS — Z87438 Personal history of other diseases of male genital organs: Secondary | ICD-10-CM | POA: Insufficient documentation

## 2015-10-11 DIAGNOSIS — Z7982 Long term (current) use of aspirin: Secondary | ICD-10-CM | POA: Insufficient documentation

## 2015-10-11 DIAGNOSIS — R0602 Shortness of breath: Secondary | ICD-10-CM | POA: Diagnosis present

## 2015-10-11 LAB — COMPREHENSIVE METABOLIC PANEL
ALT: 46 U/L (ref 17–63)
AST: 43 U/L — ABNORMAL HIGH (ref 15–41)
Albumin: 4 g/dL (ref 3.5–5.0)
Alkaline Phosphatase: 58 U/L (ref 38–126)
Anion gap: 7 (ref 5–15)
BUN: 20 mg/dL (ref 6–20)
CHLORIDE: 105 mmol/L (ref 101–111)
CO2: 23 mmol/L (ref 22–32)
CREATININE: 0.9 mg/dL (ref 0.61–1.24)
Calcium: 9.2 mg/dL (ref 8.9–10.3)
Glucose, Bld: 141 mg/dL — ABNORMAL HIGH (ref 65–99)
Potassium: 4.6 mmol/L (ref 3.5–5.1)
Sodium: 135 mmol/L (ref 135–145)
Total Bilirubin: 1.2 mg/dL (ref 0.3–1.2)
Total Protein: 7.3 g/dL (ref 6.5–8.1)

## 2015-10-11 LAB — CBC WITH DIFFERENTIAL/PLATELET
Basophils Absolute: 0 10*3/uL (ref 0.0–0.1)
Basophils Relative: 0 %
EOS PCT: 1 %
Eosinophils Absolute: 0.1 10*3/uL (ref 0.0–0.7)
HCT: 38.1 % — ABNORMAL LOW (ref 39.0–52.0)
Hemoglobin: 12.8 g/dL — ABNORMAL LOW (ref 13.0–17.0)
LYMPHS ABS: 1.1 10*3/uL (ref 0.7–4.0)
Lymphocytes Relative: 8 %
MCH: 30.2 pg (ref 26.0–34.0)
MCHC: 33.6 g/dL (ref 30.0–36.0)
MCV: 89.9 fL (ref 78.0–100.0)
MONO ABS: 1.6 10*3/uL — AB (ref 0.1–1.0)
Monocytes Relative: 10 %
Neutro Abs: 12.3 10*3/uL — ABNORMAL HIGH (ref 1.7–7.7)
Neutrophils Relative %: 81 %
PLATELETS: 189 10*3/uL (ref 150–400)
RBC: 4.24 MIL/uL (ref 4.22–5.81)
RDW: 14.3 % (ref 11.5–15.5)
WBC: 15.1 10*3/uL — ABNORMAL HIGH (ref 4.0–10.5)

## 2015-10-11 LAB — URINALYSIS, ROUTINE W REFLEX MICROSCOPIC
BILIRUBIN URINE: NEGATIVE
Glucose, UA: NEGATIVE mg/dL
HGB URINE DIPSTICK: NEGATIVE
Ketones, ur: NEGATIVE mg/dL
Leukocytes, UA: NEGATIVE
Nitrite: NEGATIVE
PH: 7.5 (ref 5.0–8.0)
Protein, ur: NEGATIVE mg/dL
SPECIFIC GRAVITY, URINE: 1.02 (ref 1.005–1.030)

## 2015-10-11 LAB — PROTIME-INR
INR: 2.17 — ABNORMAL HIGH (ref 0.00–1.49)
PROTHROMBIN TIME: 24 s — AB (ref 11.6–15.2)

## 2015-10-11 LAB — I-STAT CG4 LACTIC ACID, ED: Lactic Acid, Venous: 1.85 mmol/L (ref 0.5–2.0)

## 2015-10-11 MED ORDER — MOXIFLOXACIN HCL 400 MG PO TABS: 400.0000 mg | ORAL_TABLET | Freq: Every day | ORAL | Status: AC

## 2015-10-11 NOTE — ED Notes (Signed)
Pt states he aspirated gastric contents caused by his reflux x 2, once last night and once the day before. States he is feeling SOB and coughing frequently since then. NAD at this time. Ambulatory to triage with steady gait, breathing easy and unlabored. Cough appreciated.

## 2015-10-11 NOTE — ED Provider Notes (Signed)
CSN: [id number]     Arrival date & time 10/11/15  1408 History   First MD Initiated Contact with Patient 10/11/15 1426     Chief Complaint  Patient presents with  . Aspiration     (Consider location/radiation/quality/duration/timing/severity/associated sxs/prior Treatment) HPI Comments: Reflux, hx of but not as severe 2 days ago had episode when it came out nose, last night to throat Aspirated both times, and coughed it out of lungs Short of breath Feel like coming down with something Coughing thick mucus, dark, brown in color, was more severe this AM Sore throat No CP Runny nose, nasal congestion chronic      Past Medical History  Diagnosis Date  . Aortic stenosis     post aortic valve replacement with St.Jude's valve by Dr.VanTrigt on 07-27-11  . Coronary artery disease     status post CABG x3 in 2008  . Hypertension   . Hypothyroidism   . Dyslipidemia   . Hyperplasia of prostate     benign  . Status post shoulder joint replacement     right shoulder  . Gout    Past Surgical History  Procedure Laterality Date  . Prostate ablation    . Coronary artery bypass graft    . Aortic valve replacement    . Tonsillectomy     History reviewed. No pertinent family history. Social History  Substance Use Topics  . Smoking status: Former Research scientist (life sciences)  . Smokeless tobacco: None  . Alcohol Use: No    Review of Systems  Constitutional: Positive for fever (101, and 100.8), chills (maybe) and fatigue.  HENT: Negative for sore throat.   Eyes: Negative for visual disturbance.  Respiratory: Positive for cough and shortness of breath.   Cardiovascular: Negative for chest pain.  Gastrointestinal: Positive for abdominal distention. Negative for nausea, vomiting, abdominal pain, diarrhea and constipation.  Genitourinary: Negative for dysuria and difficulty urinating.  Musculoskeletal: Negative for back pain and neck stiffness.  Skin: Negative for rash.  Neurological: Negative for  syncope and headaches.      Allergies  Lipitor and Vancomycin  Home Medications   Prior to Admission medications   Medication Sig Start Date End Date Taking? Authorizing Provider  allopurinol (ZYLOPRIM) 300 MG tablet Take 300 mg by mouth daily.      Historical Provider, MD  aspirin 81 MG tablet Take 81 mg by mouth daily.      Historical Provider, MD  clonazePAM (KLONOPIN) 1 MG tablet Take 1 mg by mouth at bedtime.      Historical Provider, MD  fluticasone-salmeterol (ADVAIR HFA) 115-21 MCG/ACT inhaler Inhale 2 puffs into the lungs 2 (two) times daily.    Historical Provider, MD  IRON PO Take by mouth.    Historical Provider, MD  Melatonin 10 MG CAPS Take 10 mg by mouth at bedtime.    Historical Provider, MD  metoprolol (LOPRESSOR) 50 MG tablet Take 50 mg by mouth daily.     Historical Provider, MD  moxifloxacin (AVELOX) 400 MG tablet Take 1 tablet (400 mg total) by mouth daily at 8 pm. 10/11/15 10/18/15  Gareth Morgan, MD  omeprazole (PRILOSEC) 20 MG capsule Take 20 mg by mouth daily.      Historical Provider, MD  rOPINIRole (REQUIP) 2 MG tablet 2 mg p.o. At dinner time and 4 mg p.o. At bedtime     Historical Provider, MD  spironolactone (ALDACTONE) 25 MG tablet Take 25 mg by mouth daily.      Historical Provider, MD  traMADol (ULTRAM) 50 MG tablet 1 or 2 every 4 hours as needed for pain     Historical Provider, MD  warfarin (COUMADIN) 5 MG tablet Take 5 mg by mouth daily.    Historical Provider, MD   BP 116/59 mmHg  Pulse 85  Temp(Src) 98.8 F (37.1 C) (Oral)  Resp 24  Ht 5\' 7"  (1.702 m)  Wt 213 lb (96.616 kg)  BMI 33.35 kg/m2  SpO2 93% Physical Exam  Constitutional: He is oriented to person, place, and time. He appears well-developed and well-nourished. No distress.  HENT:  Head: Normocephalic and atraumatic.  Eyes: Conjunctivae and EOM are normal.  Neck: Normal range of motion.  Cardiovascular: Normal rate, regular rhythm, normal heart sounds and intact distal pulses.   Exam reveals no gallop and no friction rub.   No murmur heard. Pulmonary/Chest: Effort normal and breath sounds normal. No respiratory distress. He has no wheezes. He has no rales.  Abdominal: Soft. He exhibits no distension. There is no tenderness. There is no guarding.  Musculoskeletal: He exhibits no edema.  Neurological: He is alert and oriented to person, place, and time.  Skin: Skin is warm and dry. He is not diaphoretic.  Nursing note and vitals reviewed.   ED Course  Procedures (including critical care time) Labs Review Labs Reviewed  CBC WITH DIFFERENTIAL/PLATELET - Abnormal; Notable for the following:    WBC 15.1 (*)    Hemoglobin 12.8 (*)    HCT 38.1 (*)    Neutro Abs 12.3 (*)    Monocytes Absolute 1.6 (*)    All other components within normal limits  COMPREHENSIVE METABOLIC PANEL - Abnormal; Notable for the following:    Glucose, Bld 141 (*)    AST 43 (*)    All other components within normal limits  PROTIME-INR - Abnormal; Notable for the following:    Prothrombin Time 24.0 (*)    INR 2.17 (*)    All other components within normal limits  URINE CULTURE  CULTURE, BLOOD (ROUTINE X 2)  CULTURE, BLOOD (ROUTINE X 2)  URINALYSIS, ROUTINE W REFLEX MICROSCOPIC (NOT AT [facility])  I-STAT CG4 LACTIC ACID, ED    Imaging Review Dg Chest 2 View  10/11/2015  CLINICAL DATA:  Patient aspirated gastric contents due to reflux twice in the past 2 days, short of breath since then EXAM: CHEST  2 VIEW COMPARISON:  05/23/2015 FINDINGS: Stable mild cardiac enlargement. Patient is status post prior CABG. Vascular pattern normal. No consolidation or effusion. Mild left upper lobe scarring stable. Mild blunting left costophrenic angle suggesting chronic pleural thickening as it is stable from the prior examination as well. Replacement cardiac valve stable. IMPRESSION: No acute abnormality.  Stable mild left costophrenic angle blunting. Electronically Signed   By: Skipper Cliche M.D.   On:  10/11/2015 14:56   I have personally reviewed and evaluated these images and lab results as part of my medical decision-making.   EKG Interpretation   Date/Time:  Saturday October 11 2015 14:49:02 EST Ventricular Rate:  94 PR Interval:  120 QRS Duration: 166 QT Interval:  390 QTC Calculation: 487 R Axis:   -67 Text Interpretation:  Normal sinus rhythm Left axis deviation Right bundle  branch block Abnormal ECG No significant change since last tracing  Confirmed by [place] ([id number]) on 10/11/2015 4:49:23 PM      MDM   Final diagnoses:  Aspiration pneumonia due to gastric secretions, unspecified laterality, unspecified part of lung [facility])   81yo male  with history of CABG, AVR on coumadin, htn, hlpd, hypothyroidisim who presents with concern for 2 episodes of aspiration from reflux, cough, fatigue and fever to 101 at home. Pt with leukocytosis to 15.1. CMP overall WNL, lactic acid WNL  Urinalysis without signs of UTI.  CXR does not show signs of pneumonia, however given leukocytosis, fever at home, productive cough with recent hx of aspiration, will treat for possible early aspiration pneumonia not yet present on CXR with moxifloxacin.   Discussed need for close follow up of INR while on abx and need to return to ED if symptoms worsen. Patient discharged in stable condition with understanding of reasons to return.    Gareth Morgan, MD 10/11/15 [phone]

## 2015-10-11 NOTE — Discharge Instructions (Signed)
Aspiration Pneumonia  Aspiration pneumonia is an infection in your lungs. It occurs when food, liquid, or stomach contents (vomit) are inhaled (aspirated) into your lungs. When these things get into your lungs, swelling (inflammation) and infection can occur. This can make it difficult for you to breathe. Aspiration pneumonia is a serious condition and can be life threatening. RISK FACTORS Aspiration pneumonia is more likely to occur when a person's cough (gag) reflex or ability to swallow has been decreased. Some things that can do this include:   Having a brain injury or disease, such as stroke, seizures, Parkinson's disease, dementia, or amyotrophic lateral sclerosis (ALS).   Being given general anesthetic for procedures.   Being in a coma (unconscious).   Having a narrowing of the tube that carries food to the stomach (esophagus).   Drinking too much alcohol. If a person passes out and vomits, vomit can be swallowed into the lungs.   Taking certain medicines, such as tranquilizers or sedatives.  SIGNS AND SYMPTOMS   Coughing after swallowing food or liquids.   Breathing problems, such as wheezing or shortness of breath.   Bluish skin. This can be caused by lack of oxygen.   Coughing up food or mucus. The mucus might contain blood, greenish material, or yellowish-white fluid (pus).   Fever.   Chest pain.   Being more tired than usual (fatigue).   Sweating more than usual.   Bad breath.  DIAGNOSIS  A physical exam will be done. During the exam, the health care provider will listen to your lungs with a stethoscope to check for:   Crackling sounds in the lungs.  Decreased breath sounds.  A rapid heartbeat. Various tests may be ordered. These may include:   Chest X-ray.   CT scan.   Swallowing study. This test looks at how food is swallowed and whether it goes into your breathing tube (trachea) or food pipe (esophagus).   Sputum culture. Saliva and  mucus (sputum) are collected from the lungs or the tubes that carry air to the lungs (bronchi). The sputum is then tested for bacteria.   Bronchoscopy. This test uses a flexible tube (bronchoscope) to see inside the lungs. TREATMENT  Treatment will usually include antibiotic medicines. Other medicines may also be used to reduce fever or pain. You may need to be treated in the hospital. In the hospital, your breathing will be carefully monitored. Depending on how well you are breathing, you may need to be given oxygen, or you may need breathing support from a breathing machine (ventilator). For people who fail a swallowing study, a feeding tube might be placed in the stomach, or they may be asked to avoid certain food textures or liquids when they eat. HOME CARE INSTRUCTIONS   Carefully follow any special eating instructions you were given, such as avoiding certain food textures or thickening liquids. This reduces the risk of developing aspiration pneumonia again.  Only take over-the-counter or prescription medicines as directed by your health care provider. Follow the directions carefully.   If you were prescribed antibiotics, take them as directed. Finish them even if you start to feel better.   Rest as instructed by your health care provider.   Keep all follow-up appointments with your health care provider.  SEEK MEDICAL CARE IF:   You develop worsening shortness of breath, wheezing, or difficulty breathing.   You develop a fever.   You have chest pain.  MAKE SURE YOU:   Understand these instructions.  Will watch   your condition.  Will get help right away if you are not doing well or get worse.   This information is not intended to replace advice given to you by your health care provider. Make sure you discuss any questions you have with your health care provider.   Document Released: 08/29/2009 Document Revised: 11/06/2013 Document Reviewed: 04/19/2013 Elsevier  Interactive Patient Education 2016 [employer]  

## 2015-10-13 LAB — URINE CULTURE: Culture: NO GROWTH

## 2015-10-16 LAB — CULTURE, BLOOD (ROUTINE X 2)
CULTURE: NO GROWTH
Culture: NO GROWTH

## 2016-01-28 DIAGNOSIS — H401232 Low-tension glaucoma, bilateral, moderate stage: Secondary | ICD-10-CM

## 2016-01-28 DIAGNOSIS — H47013 Ischemic optic neuropathy, bilateral: Secondary | ICD-10-CM | POA: Insufficient documentation

## 2016-01-28 HISTORY — DX: Low-tension glaucoma, bilateral, moderate stage: H40.1232

## 2016-01-28 HISTORY — DX: Ischemic optic neuropathy, bilateral: H47.013

## 2016-02-11 ENCOUNTER — Ambulatory Visit (HOSPITAL_BASED_OUTPATIENT_CLINIC_OR_DEPARTMENT_OTHER)
Admission: RE | Admit: 2016-02-11 | Discharge: 2016-02-11 | Disposition: A | Discharge status: Home / Self Care | Payer: Medicare Other | Source: Ambulatory Visit | Attending: Nurse Practitioner | Admitting: Nurse Practitioner

## 2016-02-11 ENCOUNTER — Other Ambulatory Visit (HOSPITAL_BASED_OUTPATIENT_CLINIC_OR_DEPARTMENT_OTHER): Payer: Self-pay | Admitting: Nurse Practitioner

## 2016-02-11 DIAGNOSIS — R05 Cough: Secondary | ICD-10-CM | POA: Diagnosis not present

## 2016-02-11 DIAGNOSIS — Z951 Presence of aortocoronary bypass graft: Secondary | ICD-10-CM | POA: Insufficient documentation

## 2016-02-11 DIAGNOSIS — R059 Cough, unspecified: Secondary | ICD-10-CM

## 2016-02-11 DIAGNOSIS — J984 Other disorders of lung: Secondary | ICD-10-CM | POA: Insufficient documentation

## 2016-03-01 ENCOUNTER — Encounter: Payer: Self-pay | Admitting: Cardiology

## 2016-03-05 DIAGNOSIS — T17908A Unspecified foreign body in respiratory tract, part unspecified causing other injury, initial encounter: Secondary | ICD-10-CM | POA: Insufficient documentation

## 2016-03-05 DIAGNOSIS — J4540 Moderate persistent asthma, uncomplicated: Secondary | ICD-10-CM | POA: Insufficient documentation

## 2016-03-05 DIAGNOSIS — T17900A Unspecified foreign body in respiratory tract, part unspecified causing asphyxiation, initial encounter: Secondary | ICD-10-CM | POA: Insufficient documentation

## 2016-03-05 HISTORY — DX: Moderate persistent asthma, uncomplicated: J45.40

## 2016-03-05 HISTORY — DX: Unspecified foreign body in respiratory tract, part unspecified causing other injury, initial encounter: T17.908A

## 2016-03-30 DIAGNOSIS — R413 Other amnesia: Secondary | ICD-10-CM

## 2016-03-30 HISTORY — DX: Other amnesia: R41.3

## 2016-04-10 ENCOUNTER — Emergency Department (HOSPITAL_COMMUNITY): Payer: Medicare Other

## 2016-04-10 ENCOUNTER — Observation Stay (HOSPITAL_COMMUNITY)
Admission: EM | Admit: 2016-04-10 | Discharge: 2016-04-11 | Disposition: A | Discharge status: Home / Self Care | Payer: Medicare Other | Attending: Internal Medicine | Admitting: Internal Medicine

## 2016-04-10 ENCOUNTER — Encounter (HOSPITAL_COMMUNITY): Payer: Self-pay

## 2016-04-10 DIAGNOSIS — Z5181 Encounter for therapeutic drug level monitoring: Secondary | ICD-10-CM

## 2016-04-10 DIAGNOSIS — I2510 Atherosclerotic heart disease of native coronary artery without angina pectoris: Secondary | ICD-10-CM

## 2016-04-10 DIAGNOSIS — R55 Syncope and collapse: Secondary | ICD-10-CM | POA: Insufficient documentation

## 2016-04-10 DIAGNOSIS — Z96611 Presence of right artificial shoulder joint: Secondary | ICD-10-CM | POA: Diagnosis not present

## 2016-04-10 DIAGNOSIS — I5032 Chronic diastolic (congestive) heart failure: Secondary | ICD-10-CM | POA: Insufficient documentation

## 2016-04-10 DIAGNOSIS — Z6834 Body mass index (BMI) 34.0-34.9, adult: Secondary | ICD-10-CM | POA: Diagnosis not present

## 2016-04-10 DIAGNOSIS — I10 Essential (primary) hypertension: Secondary | ICD-10-CM | POA: Insufficient documentation

## 2016-04-10 DIAGNOSIS — E785 Hyperlipidemia, unspecified: Secondary | ICD-10-CM | POA: Diagnosis not present

## 2016-04-10 DIAGNOSIS — R079 Chest pain, unspecified: Secondary | ICD-10-CM | POA: Diagnosis present

## 2016-04-10 DIAGNOSIS — Z7901 Long term (current) use of anticoagulants: Secondary | ICD-10-CM

## 2016-04-10 DIAGNOSIS — R0789 Other chest pain: Principal | ICD-10-CM | POA: Insufficient documentation

## 2016-04-10 DIAGNOSIS — E039 Hypothyroidism, unspecified: Secondary | ICD-10-CM | POA: Insufficient documentation

## 2016-04-10 DIAGNOSIS — Z7982 Long term (current) use of aspirin: Secondary | ICD-10-CM | POA: Diagnosis not present

## 2016-04-10 DIAGNOSIS — Z952 Presence of prosthetic heart valve: Secondary | ICD-10-CM | POA: Insufficient documentation

## 2016-04-10 DIAGNOSIS — R42 Dizziness and giddiness: Secondary | ICD-10-CM | POA: Insufficient documentation

## 2016-04-10 DIAGNOSIS — Z951 Presence of aortocoronary bypass graft: Secondary | ICD-10-CM | POA: Diagnosis not present

## 2016-04-10 DIAGNOSIS — Z87891 Personal history of nicotine dependence: Secondary | ICD-10-CM | POA: Diagnosis not present

## 2016-04-10 DIAGNOSIS — N400 Enlarged prostate without lower urinary tract symptoms: Secondary | ICD-10-CM | POA: Diagnosis not present

## 2016-04-10 DIAGNOSIS — E669 Obesity, unspecified: Secondary | ICD-10-CM | POA: Insufficient documentation

## 2016-04-10 DIAGNOSIS — E038 Other specified hypothyroidism: Secondary | ICD-10-CM

## 2016-04-10 DIAGNOSIS — I2583 Coronary atherosclerosis due to lipid rich plaque: Secondary | ICD-10-CM

## 2016-04-10 HISTORY — DX: Encounter for therapeutic drug level monitoring: Z79.01

## 2016-04-10 HISTORY — DX: Chest pain, unspecified: R07.9

## 2016-04-10 HISTORY — DX: Atherosclerotic heart disease of native coronary artery without angina pectoris: I25.10

## 2016-04-10 HISTORY — DX: Hypothyroidism, unspecified: E03.9

## 2016-04-10 HISTORY — DX: Encounter for therapeutic drug level monitoring: Z51.81

## 2016-04-10 LAB — URINALYSIS, ROUTINE W REFLEX MICROSCOPIC
Bilirubin Urine: NEGATIVE
GLUCOSE, UA: NEGATIVE mg/dL
Ketones, ur: NEGATIVE mg/dL
NITRITE: NEGATIVE
PROTEIN: NEGATIVE mg/dL
Specific Gravity, Urine: 1.017 (ref 1.005–1.030)
pH: 6.5 (ref 5.0–8.0)

## 2016-04-10 LAB — PROTIME-INR
INR: 1.8 — ABNORMAL HIGH (ref 0.00–1.49)
Prothrombin Time: 20.8 seconds — ABNORMAL HIGH (ref 11.6–15.2)

## 2016-04-10 LAB — BASIC METABOLIC PANEL
ANION GAP: 6 (ref 5–15)
BUN: 16 mg/dL (ref 6–20)
CO2: 23 mmol/L (ref 22–32)
Calcium: 8.9 mg/dL (ref 8.9–10.3)
Chloride: 104 mmol/L (ref 101–111)
Creatinine, Ser: 0.86 mg/dL (ref 0.61–1.24)
GFR calc Af Amer: >60 mL/min (ref >60)
Glucose, Bld: 122 mg/dL — ABNORMAL HIGH (ref 65–99)
POTASSIUM: 4.2 mmol/L (ref 3.5–5.1)
SODIUM: 133 mmol/L — AB (ref 135–145)

## 2016-04-10 LAB — CBC
HEMATOCRIT: 35.5 % — AB (ref 39.0–52.0)
HEMOGLOBIN: 11.6 g/dL — AB (ref 13.0–17.0)
MCH: 29.4 pg (ref 26.0–34.0)
MCHC: 32.7 g/dL (ref 30.0–36.0)
MCV: 89.9 fL (ref 78.0–100.0)
Platelets: 192 10*3/uL (ref 150–400)
RBC: 3.95 MIL/uL — ABNORMAL LOW (ref 4.22–5.81)
RDW: 16 % — ABNORMAL HIGH (ref 11.5–15.5)
WBC: 6.6 10*3/uL (ref 4.0–10.5)

## 2016-04-10 LAB — URINE MICROSCOPIC-ADD ON: Bacteria, UA: NONE SEEN

## 2016-04-10 LAB — TROPONIN I: Troponin I: <0.03 ng/mL (ref <0.031)

## 2016-04-10 MED ORDER — ASPIRIN EC 325 MG PO TBEC
325.0000 mg | DELAYED_RELEASE_TABLET | Freq: Every day | ORAL | Status: DC
  Administered 2016-04-11: 325 mg via ORAL
  Filled 2016-04-10: qty 1

## 2016-04-10 MED ORDER — ADULT MULTIVITAMIN W/MINERALS CH
1.0000 | ORAL_TABLET | Freq: Every day | ORAL | Status: DC
  Administered 2016-04-11: 1 via ORAL
  Filled 2016-04-10: qty 1

## 2016-04-10 MED ORDER — NITROGLYCERIN 2 % TD OINT: 1.0000 [in_us] | TOPICAL_OINTMENT | Freq: Four times a day (QID) | TRANSDERMAL | Status: DC

## 2016-04-10 MED ORDER — SODIUM CHLORIDE 0.9 % IV SOLN
INTRAVENOUS | Status: DC
Start: 2016-04-10 — End: 2016-04-11
  Administered 2016-04-10: 10 mL via INTRAVENOUS

## 2016-04-10 MED ORDER — SODIUM CHLORIDE 0.9 % IV SOLN: 250.0000 mL | INTRAVENOUS | Status: DC | PRN

## 2016-04-10 MED ORDER — WARFARIN - PHARMACIST DOSING INPATIENT: Freq: Every day | Status: DC

## 2016-04-10 MED ORDER — ONDANSETRON HCL 4 MG/2ML IJ SOLN: 4.0000 mg | Freq: Four times a day (QID) | INTRAMUSCULAR | Status: DC | PRN

## 2016-04-10 MED ORDER — SPIRONOLACTONE 25 MG PO TABS
25.0000 mg | ORAL_TABLET | Freq: Every day | ORAL | Status: DC
  Administered 2016-04-11: 25 mg via ORAL

## 2016-04-10 MED ORDER — METOPROLOL TARTRATE 50 MG PO TABS
50.0000 mg | ORAL_TABLET | Freq: Every day | ORAL | Status: DC
Start: 2016-04-11 — End: 2016-04-11
  Administered 2016-04-11: 50 mg via ORAL
  Filled 2016-04-10: qty 1

## 2016-04-10 MED ORDER — LEVOTHYROXINE SODIUM 25 MCG PO TABS
125.0000 ug | ORAL_TABLET | Freq: Every day | ORAL | Status: DC
  Administered 2016-04-11: 125 ug via ORAL
  Filled 2016-04-10: qty 1

## 2016-04-10 MED ORDER — CLONAZEPAM 1 MG PO TABS
1.0000 mg | ORAL_TABLET | Freq: Every day | ORAL | Status: DC
  Administered 2016-04-10: 1 mg via ORAL
  Filled 2016-04-10: qty 1

## 2016-04-10 MED ORDER — ALBUTEROL SULFATE (2.5 MG/3ML) 0.083% IN NEBU: 2.5000 mg | INHALATION_SOLUTION | Freq: Four times a day (QID) | RESPIRATORY_TRACT | Status: DC | PRN

## 2016-04-10 MED ORDER — OXYCODONE HCL 5 MG PO TABS: 5.0000 mg | ORAL_TABLET | ORAL | Status: DC | PRN

## 2016-04-10 MED ORDER — ALLOPURINOL 300 MG PO TABS
300.0000 mg | ORAL_TABLET | Freq: Every day | ORAL | Status: DC
  Administered 2016-04-10: 300 mg via ORAL
  Filled 2016-04-10: qty 1

## 2016-04-10 MED ORDER — SODIUM CHLORIDE 0.9% FLUSH
3.0000 mL | Freq: Two times a day (BID) | INTRAVENOUS | Status: DC
  Administered 2016-04-10: 3 mL via INTRAVENOUS

## 2016-04-10 MED ORDER — MIRABEGRON ER 25 MG PO TB24
50.0000 mg | ORAL_TABLET | Freq: Every day | ORAL | Status: DC
  Administered 2016-04-11: 50 mg via ORAL
  Filled 2016-04-10: qty 2

## 2016-04-10 MED ORDER — LATANOPROST 0.005 % OP SOLN
1.0000 [drp] | Freq: Every day | OPHTHALMIC | Status: DC
  Administered 2016-04-11: 1 [drp] via OPHTHALMIC
  Filled 2016-04-10: qty 2.5

## 2016-04-10 MED ORDER — SODIUM CHLORIDE 0.9% FLUSH: 3.0000 mL | INTRAVENOUS | Status: DC | PRN

## 2016-04-10 MED ORDER — ROPINIROLE HCL 1 MG PO TABS
2.0000 mg | ORAL_TABLET | Freq: Every day | ORAL | Status: DC
  Administered 2016-04-10: 2 mg via ORAL
  Filled 2016-04-10: qty 2

## 2016-04-10 MED ORDER — ENOXAPARIN SODIUM 30 MG/0.3ML ~~LOC~~ SOLN
30.0000 mg | SUBCUTANEOUS | Status: DC
  Administered 2016-04-10: 30 mg via SUBCUTANEOUS
  Filled 2016-04-10: qty 0.3

## 2016-04-10 MED ORDER — CALCIUM-MAGNESIUM-ZINC PO TABS: 1.0000 | ORAL_TABLET | ORAL | Status: DC

## 2016-04-10 MED ORDER — PANTOPRAZOLE SODIUM 40 MG PO TBEC
40.0000 mg | DELAYED_RELEASE_TABLET | Freq: Every day | ORAL | Status: DC
Start: 2016-04-11 — End: 2016-04-11
  Administered 2016-04-11: 40 mg via ORAL
  Filled 2016-04-10: qty 1

## 2016-04-10 MED ORDER — OMEGA-3-ACID ETHYL ESTERS 1 G PO CAPS
1.0000 g | ORAL_CAPSULE | Freq: Every day | ORAL | Status: DC
  Administered 2016-04-11: 1 g via ORAL
  Filled 2016-04-10: qty 1

## 2016-04-10 MED ORDER — ROPINIROLE HCL 1 MG PO TABS: 1.0000 mg | ORAL_TABLET | Freq: Every day | ORAL | Status: DC | PRN

## 2016-04-10 MED ORDER — HYDROMORPHONE HCL 1 MG/ML IJ SOLN: 0.5000 mg | INTRAMUSCULAR | Status: DC | PRN

## 2016-04-10 MED ORDER — ACETAMINOPHEN 650 MG RE SUPP: 650.0000 mg | Freq: Four times a day (QID) | RECTAL | Status: DC | PRN

## 2016-04-10 MED ORDER — ONDANSETRON HCL 4 MG PO TABS: 4.0000 mg | ORAL_TABLET | Freq: Four times a day (QID) | ORAL | Status: DC | PRN

## 2016-04-10 MED ORDER — ACETAMINOPHEN 325 MG PO TABS: 650.0000 mg | ORAL_TABLET | Freq: Four times a day (QID) | ORAL | Status: DC | PRN

## 2016-04-10 MED ORDER — SODIUM CHLORIDE 0.9% FLUSH
3.0000 mL | Freq: Two times a day (BID) | INTRAVENOUS | Status: DC
  Administered 2016-04-10 – 2016-04-11 (×2): 3 mL via INTRAVENOUS

## 2016-04-10 MED ORDER — WARFARIN SODIUM 3 MG PO TABS
6.0000 mg | ORAL_TABLET | Freq: Once | ORAL | Status: AC
  Administered 2016-04-10: 6 mg via ORAL
  Filled 2016-04-10: qty 1

## 2016-04-10 MED ORDER — WARFARIN SODIUM 5 MG PO TABS: 5.0000 mg | ORAL_TABLET | ORAL | Status: DC

## 2016-04-10 MED ORDER — WARFARIN SODIUM 5 MG PO TABS: 7.5000 mg | ORAL_TABLET | ORAL | Status: DC

## 2016-04-10 NOTE — ED Notes (Signed)
Pt. Got oob to the bathroom, gait steady, pt. Denies any dizziness or pain or discomfort
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
## 2016-04-11 ENCOUNTER — Observation Stay (HOSPITAL_BASED_OUTPATIENT_CLINIC_OR_DEPARTMENT_OTHER): Payer: Medicare Other

## 2016-04-11 DIAGNOSIS — R0789 Other chest pain: Secondary | ICD-10-CM | POA: Diagnosis not present

## 2016-04-11 DIAGNOSIS — R079 Chest pain, unspecified: Secondary | ICD-10-CM | POA: Diagnosis not present

## 2016-04-11 LAB — CBC
HCT: 33.4 % — ABNORMAL LOW (ref 39.0–52.0)
HEMOGLOBIN: 10.9 g/dL — AB (ref 13.0–17.0)
MCH: 29 pg (ref 26.0–34.0)
MCHC: 32.6 g/dL (ref 30.0–36.0)
MCV: 88.8 fL (ref 78.0–100.0)
Platelets: 170 10*3/uL (ref 150–400)
RBC: 3.76 MIL/uL — AB (ref 4.22–5.81)
RDW: 16.1 % — ABNORMAL HIGH (ref 11.5–15.5)
WBC: 5.1 10*3/uL (ref 4.0–10.5)

## 2016-04-11 LAB — LIPID PANEL
CHOL/HDL RATIO: 8.5 ratio
CHOLESTEROL: 187 mg/dL (ref 0–200)
HDL: 22 mg/dL — ABNORMAL LOW (ref >40)
LDL Cholesterol: UNDETERMINED mg/dL (ref 0–99)
TRIGLYCERIDES: 424 mg/dL — AB (ref <150)
VLDL: UNDETERMINED mg/dL (ref 0–40)

## 2016-04-11 LAB — ECHOCARDIOGRAM COMPLETE
Height: 67 in
WEIGHTICAEL: 3556.8 [oz_av]

## 2016-04-11 LAB — BASIC METABOLIC PANEL
ANION GAP: 7 (ref 5–15)
BUN: 12 mg/dL (ref 6–20)
CALCIUM: 8.8 mg/dL — AB (ref 8.9–10.3)
CO2: 23 mmol/L (ref 22–32)
Chloride: 107 mmol/L (ref 101–111)
Creatinine, Ser: 0.82 mg/dL (ref 0.61–1.24)
Glucose, Bld: 113 mg/dL — ABNORMAL HIGH (ref 65–99)
Potassium: 3.8 mmol/L (ref 3.5–5.1)
SODIUM: 137 mmol/L (ref 135–145)

## 2016-04-11 LAB — TROPONIN I

## 2016-04-11 LAB — PROTIME-INR
INR: 2.14 — ABNORMAL HIGH (ref 0.00–1.49)
Prothrombin Time: 23.8 seconds — ABNORMAL HIGH (ref 11.6–15.2)

## 2016-04-11 MED ORDER — WARFARIN SODIUM 3 MG PO TABS: 6.0000 mg | ORAL_TABLET | Freq: Once | ORAL | Status: DC

## 2016-04-11 NOTE — Discharge Instructions (Signed)
Aspirin and Your Heart  Aspirin is a medicine that affects the way blood clots. Aspirin can be used to help reduce the risk of blood clots, heart attacks, and other heart-related problems.  SHOULD I TAKE ASPIRIN? Your health care provider will help you determine whether it is safe and beneficial for you to take aspirin daily. Taking aspirin daily may be beneficial if you:  Have had a heart attack or chest pain.  Have undergone open heart surgery such as coronary artery bypass surgery (CABG).  Have had coronary angioplasty.  Have experienced a stroke or transient ischemic attack (TIA).  Have peripheral vascular disease (PVD).  Have chronic heart rhythm problems such as atrial fibrillation. ARE THERE ANY RISKS OF TAKING ASPIRIN DAILY? Daily use of aspirin can increase your risk of side effects. Some of these include:  Bleeding. Bleeding problems can be minor or serious. An example of a minor problem is a cut that does not stop bleeding. An example of a more serious problem is stomach bleeding or bleeding into the brain. Your risk of bleeding is increased if you are also taking non-steroidal anti-inflammatory medicine (NSAIDs).  Increased bruising.  Upset stomach.  An allergic reaction. People who have nasal polyps have an increased risk of developing an aspirin allergy. WHAT ARE SOME GUIDELINES I SHOULD FOLLOW WHEN TAKING ASPIRIN?   Take aspirin only as directed by your health care provider. Make sure you understand how much you should take and what form you should take. The two forms of aspirin are:  Non-enteric-coated. This type of aspirin does not have a coating and is absorbed quickly. Non-enteric-coated aspirin is usually recommended for people with chest pain. This type of aspirin also comes in a chewable form.  Enteric-coated. This type of aspirin has a special coating that releases the medicine very slowly. Enteric-coated aspirin causes less stomach upset than non-enteric-coated  aspirin. This type of aspirin should not be chewed or crushed.  Drink alcohol in moderation. Drinking alcohol increases your risk of bleeding. WHEN SHOULD I SEEK MEDICAL CARE?   You have unusual bleeding or bruising.  You have stomach pain.  You have an allergic reaction. Symptoms of an allergic reaction include:  Hives.  Itchy skin.  Swelling of the lips, tongue, or face.  You have ringing in your ears. WHEN SHOULD I SEEK IMMEDIATE MEDICAL CARE?   Your bowel movements are bloody, dark red, or black in color.  You vomit or cough up blood.  You have blood in your urine.  You cough, wheeze, or feel short of breath. If you have any of the following symptoms, this is an emergency. Do not wait to see if the pain will go away. Get medical help at once. Call your local emergency services (911 in the [place] Do not drive yourself to the hospital.  You have severe chest pain, especially if the pain is crushing or pressure-like and spreads to the arms, back, neck, or jaw.  You have stroke-like symptoms, such as:   Loss of vision.   Difficulty talking.   Numbness or weakness on one side of your body.   Numbness or weakness in your arm or leg.   Not thinking clearly or feeling confused.    This information is not intended to replace advice given to you by your health care provider. Make sure you discuss any questions you have with your health care provider.   Document Released: 10/14/2008 Document Revised: 11/22/2014 Document Reviewed: 02/06/2014 Elsevier Interactive Patient Education 2016 Elsevier   Inc.  

## 2016-04-11 NOTE — Progress Notes (Signed)
*  PRELIMINARY RESULTS* Echocardiogram 2D Echocardiogram has been performed.  Peter Beltran 04/11/2016, 1:06 PM

## 2016-04-11 NOTE — Progress Notes (Signed)
ANTICOAGULATION CONSULT NOTE - Follow Up Consult  Pharmacy Consult for Coumadin Indication: Mech AVR  Allergies  Allergen Reactions  . Brimonidine Other (See Comments)    Red and burning eyes  . Brinzolamide-Brimonidine     Redness in eyes - reaction to [place]  . Lipitor [Atorvastatin Calcium]     Developed myopathy.  . Rotigotine Other (See Comments)    Falling asleep uncontrollably - reaction to Neupro  . Tamsulosin Other (See Comments)    Unknown reaction (Flomax)  . Vancomycin     Red Rash  . Zolpidem Other (See Comments)    Pt. states that he possibly had a neuro reaction.  . Dutasteride Rash    Reaction to Avodart    Patient Measurements: Height: 5\' 7"  (170.2 cm) Weight: 222 lb 4.8 oz (100.835 kg) IBW/kg (Calculated) : 66.1  Vital Signs: Temp: 98.1 F (36.7 C) (05/28 0558) Temp Source: Oral (05/28 0558) BP: 124/59 mmHg (05/28 0558) Pulse Rate: 79 (05/28 0558)  Labs:  Recent Labs  04/10/16 1553  04/10/16 2140 04/11/16 0300 04/11/16 0859  HGB 11.6*  --   --  10.9*  --   HCT 35.5*  --   --  33.4*  --   PLT 192  --   --  170  --   LABPROT 20.8*  --   --  23.8*  --   INR 1.80*  --   --  2.14*  --   CREATININE 0.86  --   --  0.82  --   TROPONINI  --   < > <0.03 <0.03 <0.03  < > = values in this interval not displayed.  Estimated Creatinine Clearance: 69.1 mL/min (by C-G formula based on Cr of 0.82).  Assessment:  Anticoag: warfarin pta for mechanical AVR. Admit INR 1.8 - last dose 5/26. INR today 2.14. - Dose PTA: Warfarin 7.5 mg Wed and 5 mg AOD  Cards: CAD, HTN, HLD, TC 187, TG 424, HDL 22, LDL unable to calculate  Renal: SCr 0.82  Heme: H&H 10.9, 33.4 down slightly.    Goal of Therapy:  INR 2-3 Monitor platelets by anticoagulation protocol: Yes   Plan:  Repeat Coumadin 6mg  po x 1 tonight Maintenance dose may need to be increased to 7.5mg  two-three days per week with 5mg  other days.  Ricki Clack S. Alford Highland, PharmD, BCPS Clinical Staff  Pharmacist Pager [phone]  Eilene Ghazi Stillinger 04/11/2016,12:00 PM
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
## 2016-07-07 IMAGING — CR DG CHEST 2V
3 series · 3 of 3 positions shown · non-contrast
Comparison: 03/10/2016

CLINICAL DATA: Chest pain

EXAM:
CHEST  2 VIEW

[chest lat (1 of 2)]
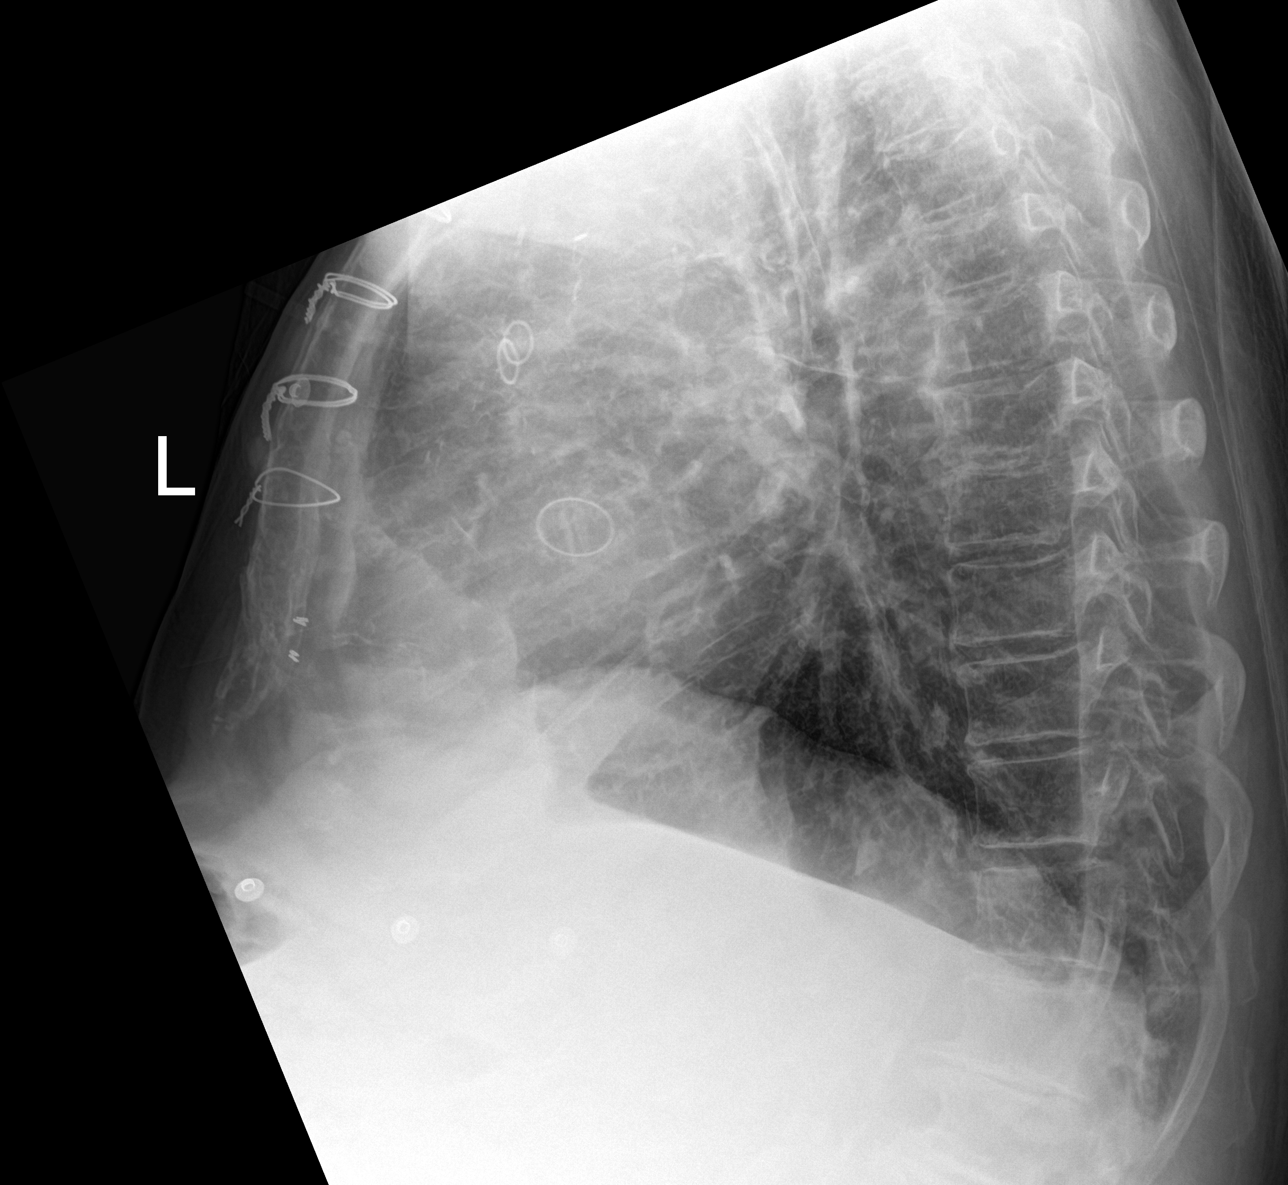

[chest ap]
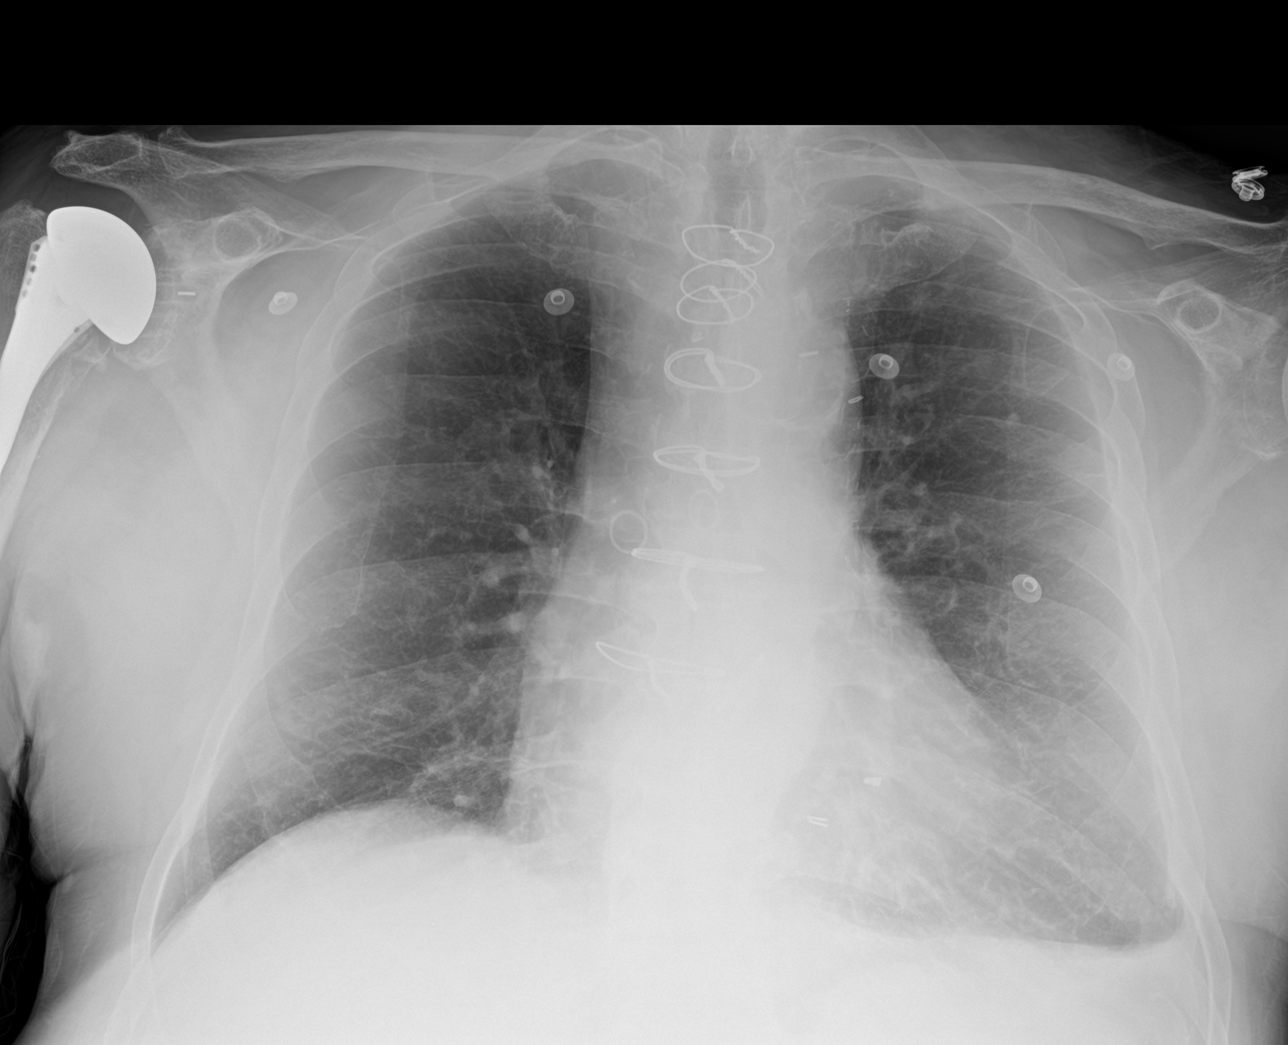

[chest lat (2 of 2)]
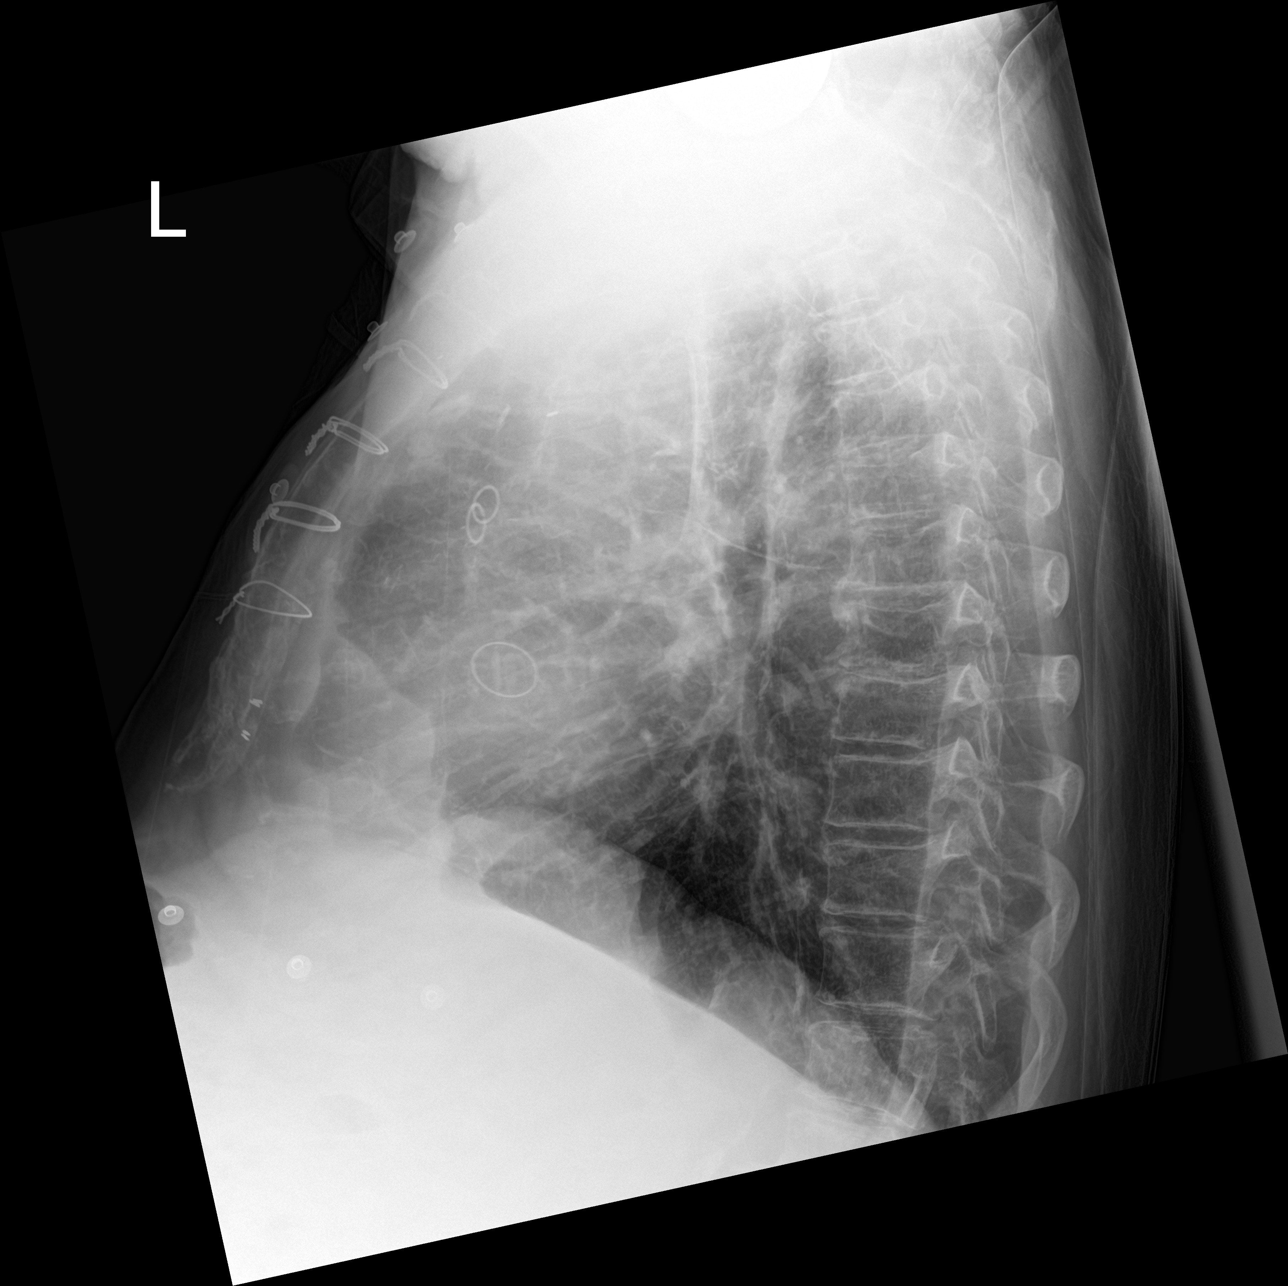

[3 of 3 positions shown; findings below may reference images not displayed]

FINDINGS: Cardiac shadow is within normal limits. Postsurgical changes are
noted. The lungs are well aerated bilaterally. Blunting of the left
costophrenic angle is noted and stable. Postsurgical changes in the
right shoulder are seen. No acute bony abnormality is noted.
IMPRESSION: Chronic changes without acute abnormality.

## 2016-08-30 DIAGNOSIS — G3184 Mild cognitive impairment, so stated: Secondary | ICD-10-CM

## 2016-08-30 HISTORY — DX: Mild cognitive impairment of uncertain or unknown etiology: G31.84

## 2016-10-04 DIAGNOSIS — E1142 Type 2 diabetes mellitus with diabetic polyneuropathy: Secondary | ICD-10-CM | POA: Insufficient documentation

## 2016-10-04 HISTORY — DX: Type 2 diabetes mellitus with diabetic polyneuropathy: E11.42

## 2016-10-20 DIAGNOSIS — R059 Cough, unspecified: Secondary | ICD-10-CM | POA: Insufficient documentation

## 2016-10-20 DIAGNOSIS — R05 Cough: Secondary | ICD-10-CM | POA: Insufficient documentation

## 2016-10-20 HISTORY — DX: Cough, unspecified: R05.9

## 2016-11-11 DIAGNOSIS — E781 Pure hyperglyceridemia: Secondary | ICD-10-CM | POA: Insufficient documentation

## 2016-11-11 DIAGNOSIS — E785 Hyperlipidemia, unspecified: Secondary | ICD-10-CM

## 2016-11-11 HISTORY — DX: Pure hyperglyceridemia: E78.1

## 2016-11-11 HISTORY — DX: Hyperlipidemia, unspecified: E78.5

## 2017-03-30 DIAGNOSIS — I4891 Unspecified atrial fibrillation: Secondary | ICD-10-CM | POA: Insufficient documentation

## 2017-03-30 HISTORY — DX: Unspecified atrial fibrillation: I48.91

## 2017-08-03 DIAGNOSIS — Z7901 Long term (current) use of anticoagulants: Secondary | ICD-10-CM

## 2017-08-03 DIAGNOSIS — Z5181 Encounter for therapeutic drug level monitoring: Secondary | ICD-10-CM

## 2017-08-03 HISTORY — DX: Encounter for therapeutic drug level monitoring: Z79.01

## 2017-08-03 HISTORY — DX: Encounter for therapeutic drug level monitoring: Z51.81

## 2017-08-16 DIAGNOSIS — D509 Iron deficiency anemia, unspecified: Secondary | ICD-10-CM

## 2017-08-16 DIAGNOSIS — E1140 Type 2 diabetes mellitus with diabetic neuropathy, unspecified: Secondary | ICD-10-CM

## 2017-08-16 DIAGNOSIS — J0100 Acute maxillary sinusitis, unspecified: Secondary | ICD-10-CM

## 2017-08-16 DIAGNOSIS — Z7982 Long term (current) use of aspirin: Secondary | ICD-10-CM

## 2017-08-16 DIAGNOSIS — I2510 Atherosclerotic heart disease of native coronary artery without angina pectoris: Secondary | ICD-10-CM

## 2017-08-16 DIAGNOSIS — B370 Candidal stomatitis: Secondary | ICD-10-CM

## 2017-08-16 DIAGNOSIS — E559 Vitamin D deficiency, unspecified: Secondary | ICD-10-CM

## 2017-08-16 DIAGNOSIS — R21 Rash and other nonspecific skin eruption: Secondary | ICD-10-CM

## 2017-08-16 DIAGNOSIS — K5900 Constipation, unspecified: Secondary | ICD-10-CM

## 2017-08-16 DIAGNOSIS — N401 Enlarged prostate with lower urinary tract symptoms: Secondary | ICD-10-CM

## 2017-08-16 DIAGNOSIS — M545 Low back pain, unspecified: Secondary | ICD-10-CM | POA: Insufficient documentation

## 2017-08-16 DIAGNOSIS — H6123 Impacted cerumen, bilateral: Secondary | ICD-10-CM

## 2017-08-16 HISTORY — DX: Vitamin D deficiency, unspecified: E55.9

## 2017-08-16 HISTORY — DX: Candidal stomatitis: B37.0

## 2017-08-16 HISTORY — DX: Iron deficiency anemia, unspecified: D50.9

## 2017-08-16 HISTORY — DX: Atherosclerotic heart disease of native coronary artery without angina pectoris: I25.10

## 2017-08-16 HISTORY — DX: Low back pain, unspecified: M54.50

## 2017-08-16 HISTORY — DX: Type 2 diabetes mellitus with diabetic neuropathy, unspecified: E11.40

## 2017-08-16 HISTORY — DX: Acute maxillary sinusitis, unspecified: J01.00

## 2017-08-16 HISTORY — DX: Impacted cerumen, bilateral: H61.23

## 2017-08-16 HISTORY — DX: Long term (current) use of aspirin: Z79.82

## 2017-08-16 HISTORY — DX: Rash and other nonspecific skin eruption: R21

## 2017-08-16 HISTORY — DX: Benign prostatic hyperplasia with lower urinary tract symptoms: N40.1

## 2017-08-16 HISTORY — DX: Constipation, unspecified: K59.00

## 2017-11-02 ENCOUNTER — Encounter: Payer: Self-pay | Admitting: Cardiology

## 2018-02-04 DIAGNOSIS — R0981 Nasal congestion: Secondary | ICD-10-CM | POA: Insufficient documentation

## 2018-02-04 DIAGNOSIS — J309 Allergic rhinitis, unspecified: Secondary | ICD-10-CM

## 2018-02-04 HISTORY — DX: Nasal congestion: R09.81

## 2018-02-04 HISTORY — DX: Allergic rhinitis, unspecified: J30.9

## 2018-05-17 DIAGNOSIS — R32 Unspecified urinary incontinence: Secondary | ICD-10-CM | POA: Insufficient documentation

## 2018-05-17 HISTORY — DX: Unspecified urinary incontinence: R32

## 2018-07-18 DIAGNOSIS — R339 Retention of urine, unspecified: Secondary | ICD-10-CM

## 2018-07-18 HISTORY — DX: Retention of urine, unspecified: R33.9

## 2018-09-20 ENCOUNTER — Telehealth: Payer: Self-pay | Admitting: Cardiology

## 2018-09-27 NOTE — Telephone Encounter (Signed)
done

## 2018-10-18 DIAGNOSIS — Z951 Presence of aortocoronary bypass graft: Secondary | ICD-10-CM

## 2018-10-18 DIAGNOSIS — R601 Generalized edema: Secondary | ICD-10-CM

## 2018-10-18 DIAGNOSIS — M546 Pain in thoracic spine: Secondary | ICD-10-CM | POA: Insufficient documentation

## 2018-10-18 DIAGNOSIS — Z952 Presence of prosthetic heart valve: Secondary | ICD-10-CM

## 2018-10-18 DIAGNOSIS — R269 Unspecified abnormalities of gait and mobility: Secondary | ICD-10-CM | POA: Insufficient documentation

## 2018-10-18 DIAGNOSIS — R682 Dry mouth, unspecified: Secondary | ICD-10-CM

## 2018-10-18 DIAGNOSIS — I130 Hypertensive heart and chronic kidney disease with heart failure and stage 1 through stage 4 chronic kidney disease, or unspecified chronic kidney disease: Secondary | ICD-10-CM

## 2018-10-18 DIAGNOSIS — I110 Hypertensive heart disease with heart failure: Secondary | ICD-10-CM

## 2018-10-18 HISTORY — DX: Presence of prosthetic heart valve: Z95.2

## 2018-10-18 HISTORY — DX: Hypertensive heart disease with heart failure: I11.0

## 2018-10-18 HISTORY — DX: Unspecified abnormalities of gait and mobility: R26.9

## 2018-10-18 HISTORY — DX: Presence of aortocoronary bypass graft: Z95.1

## 2018-10-18 HISTORY — DX: Hypertensive heart and chronic kidney disease with heart failure and stage 1 through stage 4 chronic kidney disease, or unspecified chronic kidney disease: I13.0

## 2018-10-18 HISTORY — DX: Dry mouth, unspecified: R68.2

## 2018-10-18 HISTORY — DX: Pain in thoracic spine: M54.6

## 2018-10-18 HISTORY — DX: Generalized edema: R60.1

## 2018-11-02 ENCOUNTER — Telehealth: Payer: Self-pay | Admitting: Cardiology

## 2018-11-02 NOTE — Telephone Encounter (Signed)
LAM for patient to call back to schedule appt

## 2019-01-11 ENCOUNTER — Ambulatory Visit: Payer: Medicare Other | Admitting: Cardiology

## 2019-01-15 ENCOUNTER — Ambulatory Visit: Payer: Medicare Other | Admitting: Cardiology

## 2019-01-19 ENCOUNTER — Ambulatory Visit (INDEPENDENT_AMBULATORY_CARE_PROVIDER_SITE_OTHER): Payer: Medicare Other | Admitting: Cardiology

## 2019-01-19 ENCOUNTER — Encounter: Payer: Self-pay | Admitting: Cardiology

## 2019-01-19 VITALS — BP 126/60 | HR 69 | Ht 67.0 in | Wt 229.0 lb

## 2019-01-19 DIAGNOSIS — I10 Essential (primary) hypertension: Secondary | ICD-10-CM | POA: Insufficient documentation

## 2019-01-19 DIAGNOSIS — E663 Overweight: Secondary | ICD-10-CM

## 2019-01-19 DIAGNOSIS — I2510 Atherosclerotic heart disease of native coronary artery without angina pectoris: Secondary | ICD-10-CM | POA: Diagnosis not present

## 2019-01-19 DIAGNOSIS — E782 Mixed hyperlipidemia: Secondary | ICD-10-CM | POA: Insufficient documentation

## 2019-01-19 DIAGNOSIS — Z954 Presence of other heart-valve replacement: Secondary | ICD-10-CM

## 2019-01-19 DIAGNOSIS — Z09 Encounter for follow-up examination after completed treatment for conditions other than malignant neoplasm: Secondary | ICD-10-CM

## 2019-01-19 HISTORY — DX: Mixed hyperlipidemia: E78.2

## 2019-01-19 HISTORY — DX: Presence of other heart-valve replacement: Z95.4

## 2019-01-19 HISTORY — DX: Essential (primary) hypertension: I10

## 2019-01-19 HISTORY — DX: Overweight: E66.3

## 2019-01-19 HISTORY — DX: Encounter for follow-up examination after completed treatment for conditions other than malignant neoplasm: Z09

## 2019-01-19 NOTE — Patient Instructions (Signed)
Medication Instructions:  Your physician recommends that you continue on your current medications as directed. Please refer to the Current Medication list given to you today.  If you need a refill on your cardiac medications before your next appointment, please call your pharmacy.   Lab work: None.  If you have labs (blood work) drawn today and your tests are completely normal, you will receive your results only by: Marland Kitchen MyChart Message (if you have MyChart) OR . A paper copy in the mail If you have any lab test that is abnormal or we need to change your treatment, we will call you to review the results.  Testing/Procedures: Your physician has requested that you have an echocardiogram. Echocardiography is a painless test that uses sound waves to create images of your heart. It provides your doctor with information about the size and shape of your heart and how well your heart's chambers and valves are working. This procedure takes approximately one hour. There are no restrictions for this procedure.    Follow-Up: At [facility], you and your health needs are our priority.  As part of our continuing mission to provide you with exceptional heart care, we have created designated Provider Care Teams.  These Care Teams include your primary Cardiologist (physician) and Advanced Practice Providers (APPs -  Physician Assistants and Nurse Practitioners) who all work together to provide you with the care you need, when you need it. You will need a follow up appointment in 6 months.  Please call our office 2 months in advance to schedule this appointment.  You may see No primary care provider on file. or another member of our [employer] in [place]: Jenne Campus, MD . Shirlee More, MD  Any Other Special Instructions Will Be Listed Below (If Applicable).   Echocardiogram An echocardiogram is a procedure that uses painless sound waves (ultrasound) to produce an image of the  heart. Images from an echocardiogram can provide important information about:  Signs of coronary artery disease (CAD).  Aneurysm detection. An aneurysm is a weak or damaged part of an artery wall that bulges out from the normal force of blood pumping through the body.  Heart size and shape. Changes in the size or shape of the heart can be associated with certain conditions, including heart failure, aneurysm, and CAD.  Heart muscle function.  Heart valve function.  Signs of a past heart attack.  Fluid buildup around the heart.  Thickening of the heart muscle.  A tumor or infectious growth around the heart valves. Tell a health care provider about:  Any allergies you have.  All medicines you are taking, including vitamins, herbs, eye drops, creams, and over-the-counter medicines.  Any blood disorders you have.  Any surgeries you have had.  Any medical conditions you have.  Whether you are pregnant or may be pregnant. What are the risks? Generally, this is a safe procedure. However, problems may occur, including:  Allergic reaction to dye (contrast) that may be used during the procedure. What happens before the procedure? No specific preparation is needed. You may eat and drink normally. What happens during the procedure?   An IV tube may be inserted into one of your veins.  You may receive contrast through this tube. A contrast is an injection that improves the quality of the pictures from your heart.  A gel will be applied to your chest.  A wand-like tool (transducer) will be moved over your chest. The gel will help  to transmit the sound waves from the transducer.  The sound waves will harmlessly bounce off of your heart to allow the heart images to be captured in real-time motion. The images will be recorded on a computer. The procedure may vary among health care providers and hospitals. What happens after the procedure?  You may return to your normal, everyday  life, including diet, activities, and medicines, unless your health care provider tells you not to do that. Summary  An echocardiogram is a procedure that uses painless sound waves (ultrasound) to produce an image of the heart.  Images from an echocardiogram can provide important information about the size and shape of your heart, heart muscle function, heart valve function, and fluid buildup around your heart.  You do not need to do anything to prepare before this procedure. You may eat and drink normally.  After the echocardiogram is completed, you may return to your normal, everyday life, unless your health care provider tells you not to do that. This information is not intended to replace advice given to you by your health care provider. Make sure you discuss any questions you have with your health care provider. Document Released: 10/29/2000 Document Revised: 12/04/2016 Document Reviewed: 12/04/2016 Elsevier Interactive Patient Education  2019 [employer].

## 2019-01-19 NOTE — Progress Notes (Signed)
Cardiology Office Note:    Date:  01/19/2019   ID:  Peter Beltran, DOB 1925/03/20, MRN [id number]  PCP:  Javier Glazier, MD  Cardiologist:  Jenean Lindau, MD   Referring MD: Javier Glazier, MD    ASSESSMENT:    1. Follow-up visit for aortic valve replacement with metallic valve   2. Coronary artery disease involving native coronary artery of native heart without angina pectoris   3. Essential hypertension   4. Overweight    PLAN:    In order of problems listed above:  1. Secondary prevention stressed with the patient.  Importance of compliance with diet and medication stressed and he vocalized understanding.  His blood pressure is stable. 2. Diet was discussed for dyslipidemia and obesity.  Risks of obesity explained he vocalized understanding.  Statin benefits were explained but he is vehemently against them and I respect his wishes. 3. Echocardiogram will be done to assess prosthetic aortic valve and as a baseline 4. Patient will be seen in follow-up appointment in 6 months or earlier if the patient has any concerns    Medication Adjustments/Labs and Tests Ordered: Current medicines are reviewed at length with the patient today.  Concerns regarding medicines are outlined above.  Orders Placed This Encounter  Procedures  . ECHOCARDIOGRAM COMPLETE   No orders of the defined types were placed in this encounter.    No chief complaint on file.    History of Present Illness:    Peter Beltran is a 85 y.o. male.  He is a patient of Dr. Wynonia Lawman.  He is here for follow-up and to be established.  He has known coronary artery disease post CABG surgery.  He has had aortic valve replacement and then subsequently will be due prosthetic valve surgery because the bioprosthesis is degenerated.  Subsequently he has a Environmental health practitioner AVR 19 mm on August 2012.  He denies any problems at this time and takes care of activities of daily living.  He ambulates age appropriately.  He  says his blood work and anticoagulation management is done at his assisted living.  He is Editor, commissioning.  He is vehemently against statin therapy or lipid-lowering medications.  At the time of my evaluation, the patient is alert awake oriented and in no distress.  Past Medical History:  Diagnosis Date  . Aortic stenosis    post aortic valve replacement with St.Jude's valve by Dr.VanTrigt on 07-27-11  . Coronary artery disease    status post CABG x3 in 2008  . Dyslipidemia   . Gout   . Hyperplasia of prostate    benign  . Hypertension   . Hypothyroidism   . Status post shoulder joint replacement    right shoulder    Past Surgical History:  Procedure Laterality Date  . AORTIC VALVE REPLACEMENT    . CORONARY ARTERY BYPASS GRAFT    . PROSTATE ABLATION    . TONSILLECTOMY      Current Medications: Current Meds  Medication Sig  . albuterol (PROVENTIL HFA;VENTOLIN HFA) 108 (90 Base) MCG/ACT inhaler Inhale 2 puffs into the lungs every 6 (six) hours as needed for wheezing or shortness of breath.  . allopurinol (ZYLOPRIM) 300 MG tablet Take 300 mg by mouth at bedtime.   Marland Kitchen aspirin EC 81 MG tablet Take 81 mg by mouth daily.  . clonazePAM (KLONOPIN) 0.5 MG tablet Take 1 mg by mouth at bedtime.  . docusate sodium (COLACE) 100 MG capsule Take 100 mg by  mouth 4 (four) times a week. Take 1 capsule (100 mg) by mouth with every iron tablet  . dorzolamide (TRUSOPT) 2 % ophthalmic solution Place 1 drop into both eyes 3 (three) times daily.  . Ferrous Sulfate (IRON SLOW RELEASE) 143 (45 Fe) MG TBCR Take 143 mg by mouth 4 (four) times a week.  . Fluticasone-Salmeterol (ADVAIR) 100-50 MCG/DOSE AEPB Inhale 2 puffs into the lungs 2 (two) times daily.  . furosemide (LASIX) 20 MG tablet Take 20 mg by mouth 2 (two) times daily.  Marland Kitchen latanoprost (XALATAN) 0.005 % ophthalmic solution Place 1 drop into both eyes at bedtime.  Marland Kitchen levothyroxine (SYNTHROID, LEVOTHROID) 125 MCG tablet Take 125 mcg by mouth  daily before breakfast.  . metoprolol (LOPRESSOR) 50 MG tablet Take 50 mg by mouth daily.   . mirabegron ER (MYRBETRIQ) 50 MG TB24 tablet Take 50 mg by mouth daily.  . Multiple Minerals (CALCIUM-MAGNESIUM-ZINC) TABS Take 1 tablet by mouth every other day.  . Omega-3 Fatty Acids (FISH OIL) 1200 MG CAPS Take 1,200 mg by mouth 2 (two) times a week.  Marland Kitchen omeprazole (PRILOSEC) 20 MG capsule Take 20 mg by mouth 2 (two) times daily before a meal.   . PRESCRIPTION MEDICATION Inhale into the lungs at bedtime. CPAP  . Psyllium (METAMUCIL PO) Take 5 mLs by mouth at bedtime. Mix in liquid and drink  . rOPINIRole (REQUIP) 2 MG tablet Take 2 mg by mouth See admin instructions. Take 1 tablet (2 mg) by mouth daily at bedtime, may also take 1 tablet in the afternoon as needed for restless legs  . spironolactone (ALDACTONE) 25 MG tablet Take 25 mg by mouth daily.    . traMADol (ULTRAM) 50 MG tablet Take 50-100 mg by mouth every 4 (four) hours as needed (pain). 1 or 2 every 4 hours as needed for pain  . warfarin (COUMADIN) 5 MG tablet Take 5-7.5 mg by mouth daily before supper. Take 1 1/2 tablets (7.5 mg) by mouth on Wednesday, take 1 tablet (5 mg) on all other days of the week     Allergies:   Atorvastatin calcium; Dorzolamide; Brimonidine; Brinzolamide-brimonidine; Lipitor [atorvastatin calcium]; Rotigotine; Tamsulosin; Vancomycin; Zolpidem; and Dutasteride   Social History   Socioeconomic History  . Marital status: Married    Spouse name: Not on file  . Number of children: Not on file  . Years of education: Not on file  . Highest education level: Not on file  Occupational History  . Not on file  Social Needs  . Financial resource strain: Not on file  . Food insecurity:    Worry: Not on file    Inability: Not on file  . Transportation needs:    Medical: Not on file    Non-medical: Not on file  Tobacco Use  . Smoking status: Former Research scientist (life sciences)  . Smokeless tobacco: Never Used  Substance and Sexual  Activity  . Alcohol use: No  . Drug use: No  . Sexual activity: Not on file  Lifestyle  . Physical activity:    Days per week: Not on file    Minutes per session: Not on file  . Stress: Not on file  Relationships  . Social connections:    Talks on phone: Not on file    Gets together: Not on file    Attends religious service: Not on file    Active member of club or organization: Not on file    Attends meetings of clubs or organizations: Not on file  Relationship status: Not on file  Other Topics Concern  . Not on file  Social History Narrative  . Not on file     Family History: The patient's family history is not on file.  ROS:   Please see the history of present illness.    All other systems reviewed and are negative.  EKGs/Labs/Other Studies Reviewed:    The following studies were reviewed today: EKG reveals sinus rhythm, right bundle branch block and left anterior hemiblock and nonspecific ST-T changes.   Recent Labs: No results found for requested labs within last 8760 hours.  Recent Lipid Panel    Component Value Date/Time   CHOL 187 04/11/2016 0300   TRIG 424 (H) 04/11/2016 0300   HDL 22 (L) 04/11/2016 0300   CHOLHDL 8.5 04/11/2016 0300   VLDL UNABLE TO CALCULATE IF TRIGLYCERIDE OVER 400 mg/dL 04/11/2016 0300   LDLCALC UNABLE TO CALCULATE IF TRIGLYCERIDE OVER 400 mg/dL 04/11/2016 0300    Physical Exam:    VS:  BP 126/60 (BP Location: Right Arm, Patient Position: Sitting, Cuff Size: Normal)   Pulse 69   Ht 5\' 7"  (1.702 m)   Wt 229 lb (103.9 kg)   SpO2 98%   BMI 35.87 kg/m     Wt Readings from Last 3 Encounters:  01/19/19 229 lb (103.9 kg)  04/10/16 222 lb 4.8 oz (100.8 kg)  10/11/15 213 lb (96.6 kg)     GEN: Patient is in no acute distress HEENT: Normal NECK: No JVD; No carotid bruits LYMPHATICS: No lymphadenopathy CARDIAC: Hear sounds regular, 2/6 systolic murmur at the apex. RESPIRATORY:  Clear to auscultation without rales, wheezing or  rhonchi  ABDOMEN: Soft, non-tender, non-distended MUSCULOSKELETAL:  No edema; No deformity  SKIN: Warm and dry NEUROLOGIC:  Alert and oriented x 3 PSYCHIATRIC:  Normal affect   Signed, Jenean Lindau, MD  01/19/2019 9:37 AM    [place]

## 2019-01-22 NOTE — Addendum Note (Signed)
Addended by: Anselm Pancoast on: 01/22/2019 08:42 AM   Modules accepted: Orders

## 2019-01-26 ENCOUNTER — Ambulatory Visit (HOSPITAL_BASED_OUTPATIENT_CLINIC_OR_DEPARTMENT_OTHER)
Admission: RE | Admit: 2019-01-26 | Discharge: 2019-01-26 | Disposition: A | Discharge status: Home / Self Care | Payer: Medicare Other | Source: Ambulatory Visit | Attending: Cardiology | Admitting: Cardiology

## 2019-01-26 ENCOUNTER — Other Ambulatory Visit: Payer: Self-pay

## 2019-01-26 DIAGNOSIS — I10 Essential (primary) hypertension: Secondary | ICD-10-CM | POA: Diagnosis present

## 2019-01-26 DIAGNOSIS — I2510 Atherosclerotic heart disease of native coronary artery without angina pectoris: Secondary | ICD-10-CM | POA: Insufficient documentation

## 2019-01-26 NOTE — Progress Notes (Signed)
  Echocardiogram 2D Echocardiogram has been performed.  Rilley Stash T Melecio Cueto 01/26/2019, 4:17 PM

## 2019-02-06 ENCOUNTER — Telehealth: Payer: Self-pay

## 2019-02-06 NOTE — Telephone Encounter (Signed)
Results relayed to patient. Patient states he has a mechanical aortic valve and is in agreement with results.No further questions at this time.

## 2019-07-22 DIAGNOSIS — N179 Acute kidney failure, unspecified: Secondary | ICD-10-CM

## 2019-07-22 HISTORY — DX: Acute kidney failure, unspecified: N17.9

## 2019-07-25 DIAGNOSIS — R600 Localized edema: Secondary | ICD-10-CM | POA: Insufficient documentation

## 2019-07-25 DIAGNOSIS — Z23 Encounter for immunization: Secondary | ICD-10-CM

## 2019-07-25 DIAGNOSIS — L0390 Cellulitis, unspecified: Secondary | ICD-10-CM

## 2019-07-25 DIAGNOSIS — L989 Disorder of the skin and subcutaneous tissue, unspecified: Secondary | ICD-10-CM

## 2019-07-25 DIAGNOSIS — R5381 Other malaise: Secondary | ICD-10-CM | POA: Insufficient documentation

## 2019-07-25 HISTORY — DX: Localized edema: R60.0

## 2019-07-25 HISTORY — DX: Encounter for immunization: Z23

## 2019-07-25 HISTORY — DX: Disorder of the skin and subcutaneous tissue, unspecified: L98.9

## 2019-07-25 HISTORY — DX: Other malaise: R53.81

## 2019-07-25 HISTORY — DX: Cellulitis, unspecified: L03.90

## 2019-08-06 ENCOUNTER — Other Ambulatory Visit: Payer: Self-pay

## 2019-08-06 ENCOUNTER — Encounter: Payer: Self-pay | Admitting: Cardiology

## 2019-08-06 ENCOUNTER — Ambulatory Visit (INDEPENDENT_AMBULATORY_CARE_PROVIDER_SITE_OTHER): Payer: Medicare Other | Admitting: Cardiology

## 2019-08-06 VITALS — BP 118/54 | HR 86 | Temp 97.7°F | Wt 225.0 lb

## 2019-08-06 DIAGNOSIS — E782 Mixed hyperlipidemia: Secondary | ICD-10-CM

## 2019-08-06 DIAGNOSIS — Z7901 Long term (current) use of anticoagulants: Secondary | ICD-10-CM

## 2019-08-06 DIAGNOSIS — Z09 Encounter for follow-up examination after completed treatment for conditions other than malignant neoplasm: Secondary | ICD-10-CM | POA: Diagnosis not present

## 2019-08-06 DIAGNOSIS — I4891 Unspecified atrial fibrillation: Secondary | ICD-10-CM

## 2019-08-06 DIAGNOSIS — I2510 Atherosclerotic heart disease of native coronary artery without angina pectoris: Secondary | ICD-10-CM

## 2019-08-06 DIAGNOSIS — I10 Essential (primary) hypertension: Secondary | ICD-10-CM

## 2019-08-06 DIAGNOSIS — Z954 Presence of other heart-valve replacement: Secondary | ICD-10-CM | POA: Diagnosis not present

## 2019-08-06 NOTE — Patient Instructions (Signed)

## 2019-08-06 NOTE — Progress Notes (Signed)
Cardiology Office Note:    Date:  08/06/2019   ID:  Peter Beltran, DOB 04/04/1925, MRN [id number]  PCP:  Peter Glazier, MD  Cardiologist:  Peter Lindau, MD   Referring MD: Peter Glazier, MD    ASSESSMENT:    1. Follow-up visit for aortic valve replacement with metallic valve   2. Long term (current) use of anticoagulants   3. Mixed dyslipidemia   4. Status post aortic valve replacement with metallic valve   5. Essential hypertension   6. Coronary artery disease involving native coronary artery of native heart without angina pectoris   7. Atrial fibrillation, unspecified type ([place])    PLAN:    In order of problems listed above:  1. Coronary artery disease post recent coronary stenting: Secondary prevention stressed with the patient.  Importance of compliance with diet and medication stressed and he vocalized understanding. 2. Essential hypertension: Blood pressure is stable. 3. Mixed dyslipidemia: Lipids are followed by primary care physician.  He is not keen on taking statins and had a discussion with him about it.  He tells me that he is intolerant and currently does not care much about lipid-lowering therapy.  He promises to do better with his diet.  I respect his wishes. 4. Metallic aortic valve functioning appropriately based on recent evaluations.  I also reviewed last echocardiogram with him.  Hospital records were reviewed extensively and questions were answered to his satisfaction.Patient will be seen in follow-up appointment in 6 months or earlier if the patient has any concerns    Medication Adjustments/Labs and Tests Ordered: Current medicines are reviewed at length with the patient today.  Concerns regarding medicines are outlined above.  No orders of the defined types were placed in this encounter.  No orders of the defined types were placed in this encounter.    No chief complaint on file.    History of Present Illness:    Peter Beltran is a 85 y.o. male.  Patient has past medical history of coronary artery disease post CABG, essential hypertension and dyslipidemia.  He mentions to me that he is statin intolerant.  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  Went to [facility].  He had acute coronary syndrome and underwent coronary stenting.  Results are mentioned below.  Subsequently has done fine.  No chest pain orthopnea or PND.  At the time of my evaluation, the patient is alert awake oriented and in no distress.  Past Medical History:  Diagnosis Date  . Aortic stenosis    post aortic valve replacement with St.Jude's valve by Dr.VanTrigt on 07-27-11  . Coronary artery disease    status post CABG x3 in 2008  . Dyslipidemia   . Gout   . Hyperplasia of prostate    benign  . Hypertension   . Hypothyroidism   . Myocardial infarct ([place])   . Status post shoulder joint replacement    right shoulder    Past Surgical History:  Procedure Laterality Date  . AORTIC VALVE REPLACEMENT    . CORONARY ARTERY BYPASS GRAFT    . PROSTATE ABLATION    . TONSILLECTOMY      Current Medications: Current Meds  Medication Sig  . albuterol (PROVENTIL HFA;VENTOLIN HFA) 108 (90 Base) MCG/ACT inhaler Inhale 2 puffs into the lungs every 6 (six) hours as needed for wheezing or shortness of breath.  . allopurinol (ZYLOPRIM) 300 MG tablet Take  300 mg by mouth at bedtime.   Marland Kitchen aspirin EC 81 MG tablet Take 81 mg by mouth daily.  . clonazePAM (KLONOPIN) 0.5 MG tablet Take 1 mg by mouth at bedtime.  . clopidogrel (PLAVIX) 75 MG tablet Take 75 mg by mouth daily.  Marland Kitchen docusate sodium (COLACE) 100 MG capsule Take 100 mg by mouth 4 (four) times a week. Take 1 capsule (100 mg) by mouth with every iron tablet  . dorzolamide (TRUSOPT) 2 % ophthalmic solution Place 1 drop into both eyes 3 (three) times daily.  . ergocalciferol (VITAMIN D2) 1.25 MG (50000 UT) capsule Take 50,000 Units by  mouth once a week.  . fenofibrate (TRICOR) 145 MG tablet Take 145 mg by mouth daily.  . Ferrous Sulfate (IRON SLOW RELEASE) 143 (45 Fe) MG TBCR Take 143 mg by mouth 4 (four) times a week.  . Fluticasone-Salmeterol (ADVAIR) 100-50 MCG/DOSE AEPB Inhale 2 puffs into the lungs 2 (two) times daily.  . furosemide (LASIX) 20 MG tablet Take 20 mg by mouth 2 (two) times daily.  Marland Kitchen gabapentin (NEURONTIN) 100 MG capsule Take 100 mg by mouth 3 (three) times daily.  Marland Kitchen latanoprost (XALATAN) 0.005 % ophthalmic solution Place 1 drop into both eyes at bedtime.  Marland Kitchen levothyroxine (SYNTHROID, LEVOTHROID) 125 MCG tablet Take 125 mcg by mouth daily before breakfast.  . metoprolol (LOPRESSOR) 50 MG tablet Take 50 mg by mouth daily.   . mirabegron ER (MYRBETRIQ) 50 MG TB24 tablet Take 50 mg by mouth daily.  . Multiple Minerals (CALCIUM-MAGNESIUM-ZINC) TABS Take 1 tablet by mouth every other day.  . Omega-3 Fatty Acids (FISH OIL) 1200 MG CAPS Take 1,200 mg by mouth 2 (two) times a week.  Marland Kitchen omeprazole (PRILOSEC) 20 MG capsule Take 20 mg by mouth 2 (two) times daily before a meal.   . PRESCRIPTION MEDICATION Inhale into the lungs at bedtime. CPAP  . Psyllium (METAMUCIL PO) Take 5 mLs by mouth at bedtime. Mix in liquid and drink  . rOPINIRole (REQUIP) 2 MG tablet Take 2 mg by mouth See admin instructions. Take 1 tablet (2 mg) by mouth daily at bedtime, may also take 1 tablet in the afternoon as needed for restless legs  . spironolactone (ALDACTONE) 25 MG tablet Take 25 mg by mouth daily.    . traMADol (ULTRAM) 50 MG tablet Take 50-100 mg by mouth every 4 (four) hours as needed (pain). 1 or 2 every 4 hours as needed for pain  . warfarin (COUMADIN) 5 MG tablet Take 5-7.5 mg by mouth daily before supper. Take 1 1/2 tablets (7.5 mg) by mouth on Wednesday, take 1 tablet (5 mg) on all other days of the week     Allergies:   Atorvastatin calcium, Dorzolamide, Brimonidine, Brinzolamide-brimonidine, Lipitor [atorvastatin calcium],  Rotigotine, Tamsulosin, Vancomycin, Zolpidem, and Dutasteride   Social History   Socioeconomic History  . Marital status: Married    Spouse name: Not on file  . Number of children: Not on file  . Years of education: Not on file  . Highest education level: Not on file  Occupational History  . Not on file  Social Needs  . Financial resource strain: Not on file  . Food insecurity    Worry: Not on file    Inability: Not on file  . Transportation needs    Medical: Not on file    Non-medical: Not on file  Tobacco Use  . Smoking status: Former Research scientist (life sciences)  . Smokeless tobacco: Never Used  Substance and Sexual  Activity  . Alcohol use: No  . Drug use: No  . Sexual activity: Not on file  Lifestyle  . Physical activity    Days per week: Not on file    Minutes per session: Not on file  . Stress: Not on file  Relationships  . Social Herbalist on phone: Not on file    Gets together: Not on file    Attends religious service: Not on file    Active member of club or organization: Not on file    Attends meetings of clubs or organizations: Not on file    Relationship status: Not on file  Other Topics Concern  . Not on file  Social History Narrative  . Not on file     Family History: The patient's family history is not on file.  ROS:   Please see the history of present illness.    All other systems reviewed and are negative.  EKGs/Labs/Other Studies Reviewed:    The following studies were reviewed today: Diagnostic Summary INDICATION: NSTEMI 1. The native coronary arteries are occluded proximally. 2. Patent LIMA-LAD 3. Patent SVG-OM2 4. Severe stenosis of the SVG-rPDA 5. Normal mobility of the bileaflet mechanical aortic valve. Interventional Summary Successful stenting of the SVG-PDA with a 4.0/30 and 4.0/26 mm drug eluting stents. Interventional Recommendations 1. Will increase the antithrombotic therapy by continuing coumadin, discontinuing aspirin, and  starting plavix 75 mg daily. 2. Continue aggressive CVD risk factor modification. 3. Lifelong coumadin with target INR 2.0-2.5 while on plavix (INR target 2.0-3.0 when off plavix).  Signatures  Electronically signed by Bishop Limbo, DO, FACC(Interventional  Physician) on 07/20/2019 11:39   Recent Labs: No results found for requested labs within last 8760 hours.  Recent Lipid Panel    Component Value Date/Time   CHOL 187 04/11/2016 0300   TRIG 424 (H) 04/11/2016 0300   HDL 22 (L) 04/11/2016 0300   CHOLHDL 8.5 04/11/2016 0300   VLDL UNABLE TO CALCULATE IF TRIGLYCERIDE OVER 400 mg/dL 04/11/2016 0300   LDLCALC UNABLE TO CALCULATE IF TRIGLYCERIDE OVER 400 mg/dL 04/11/2016 0300    Physical Exam:    VS:  BP (!) 118/54 (BP Location: Left Arm, Patient Position: Sitting, Cuff Size: Normal)   Pulse 86   Temp 97.7 F (36.5 C)   Wt 225 lb (102.1 kg)   SpO2 97%   BMI 35.24 kg/m     Wt Readings from Last 3 Encounters:  08/06/19 225 lb (102.1 kg)  01/19/19 229 lb (103.9 kg)  04/10/16 222 lb 4.8 oz (100.8 kg)     GEN: Patient is in no acute distress HEENT: Normal NECK: No JVD; No carotid bruits LYMPHATICS: No lymphadenopathy CARDIAC: Hear sounds regular, 2/6 systolic murmur at the apex. RESPIRATORY:  Clear to auscultation without rales, wheezing or rhonchi  ABDOMEN: Soft, non-tender, non-distended MUSCULOSKELETAL:  No edema; No deformity  SKIN: Warm and dry NEUROLOGIC:  Alert and oriented x 3 PSYCHIATRIC:  Normal affect   Signed, Peter Lindau, MD  08/06/2019 10:14 AM    [place]

## 2019-08-22 DIAGNOSIS — I252 Old myocardial infarction: Secondary | ICD-10-CM

## 2019-08-22 HISTORY — DX: Old myocardial infarction: I25.2

## 2019-12-27 DIAGNOSIS — J310 Chronic rhinitis: Secondary | ICD-10-CM | POA: Insufficient documentation

## 2019-12-27 HISTORY — DX: Chronic rhinitis: J31.0

## 2020-01-03 ENCOUNTER — Encounter: Payer: Self-pay | Admitting: Cardiology

## 2020-01-03 ENCOUNTER — Other Ambulatory Visit: Payer: Self-pay

## 2020-01-03 ENCOUNTER — Telehealth (INDEPENDENT_AMBULATORY_CARE_PROVIDER_SITE_OTHER): Payer: Medicare Other | Admitting: Cardiology

## 2020-01-03 ENCOUNTER — Telehealth: Payer: Self-pay

## 2020-01-03 VITALS — BP 156/73 | HR 82 | Temp 97.4°F | Ht 67.0 in | Wt 225.0 lb

## 2020-01-03 DIAGNOSIS — T82857D Stenosis of cardiac prosthetic devices, implants and grafts, subsequent encounter: Secondary | ICD-10-CM | POA: Diagnosis not present

## 2020-01-03 DIAGNOSIS — Z8679 Personal history of other diseases of the circulatory system: Secondary | ICD-10-CM | POA: Diagnosis not present

## 2020-01-03 DIAGNOSIS — I10 Essential (primary) hypertension: Secondary | ICD-10-CM

## 2020-01-03 DIAGNOSIS — E663 Overweight: Secondary | ICD-10-CM

## 2020-01-03 DIAGNOSIS — I2510 Atherosclerotic heart disease of native coronary artery without angina pectoris: Secondary | ICD-10-CM | POA: Diagnosis not present

## 2020-01-03 DIAGNOSIS — E782 Mixed hyperlipidemia: Secondary | ICD-10-CM

## 2020-01-03 NOTE — Telephone Encounter (Signed)
Called patient at 1028 to do post follow up visit.  Pt was able to provide VS which were given to Dr. Geraldo Pitter as well as placed in the chart. Pt is unable to verify medications as he states "they bring me a cup of pills and I take them. Pt states that he is on coumadin and that it gets checked and adjusted by the clinic.  Pt states that we have a copy of the medications in our system. Pt states that he thinks that he only takes Coumadin and Plavix.   I have went though the patients records and it shows that he takes Coumadin, Plavix and Aspirin. I have attempted to call the patient back but he did not answer.  Will continue to attempt to get a accurate medication list.

## 2020-01-03 NOTE — Addendum Note (Signed)
Addended by: Truddie Hidden on: 01/03/2020 11:46 AM   Modules accepted: Orders

## 2020-01-03 NOTE — Progress Notes (Signed)
Virtual Visit via Telephone Note   This visit type was conducted due to national recommendations for restrictions regarding the COVID-19 Pandemic (e.g. social distancing) in an effort to limit this patient's exposure and mitigate transmission in our community.  Due to his co-morbid illnesses, this patient is at least at moderate risk for complications without adequate follow up.  This format is felt to be most appropriate for this patient at this time.  The patient did not have access to video technology/had technical difficulties with video requiring transitioning to audio format only (telephone).  All issues noted in this document were discussed and addressed.  No physical exam could be performed with this format.  Please refer to the patient's chart for his  consent to telehealth for [facility].   Date:  01/03/2020   ID:  Peter Beltran, DOB 09/09/1925, MRN [id number]  Patient Location: [place] Provider Location: Home  PCP:  Peter Glazier, MD  Cardiologist:  No primary care provider on file.  Electrophysiologist:  None   Evaluation Performed:  Follow-Up Visit  Chief Complaint: Prosthetic aortic valve follow-up  History of Present Illness:    Peter Beltran is a 86 y.o. male with past medical history of essential hypertension, coronary artery disease, dyslipidemia diabetes mellitus and metallic prosthetic aortic valve.  Last evaluation revealed moderate aortic stenosis.  He denies any problems at this time and takes care of activities of daily living.  No chest pain orthopnea or PND.  No dizzy spells syncope chest pain or shortness of breath.  At the time of my evaluation, the patient is alert awake oriented and in no distress.  The patient does not have symptoms concerning for COVID-19 infection (fever, chills, cough, or new shortness of breath).    Past Medical History:  Diagnosis Date  . Aortic stenosis    post aortic valve replacement with  St.Jude's valve by Dr.VanTrigt on 07-27-11  . Coronary artery disease    status post CABG x3 in 2008  . Dyslipidemia   . Gout   . Hyperplasia of prostate    benign  . Hypertension   . Hypothyroidism   . Myocardial infarct ([place])   . Status post shoulder joint replacement    right shoulder   Past Surgical History:  Procedure Laterality Date  . AORTIC VALVE REPLACEMENT    . CORONARY ARTERY BYPASS GRAFT    . PROSTATE ABLATION    . TONSILLECTOMY       Current Meds  Medication Sig  . albuterol (PROVENTIL HFA;VENTOLIN HFA) 108 (90 Base) MCG/ACT inhaler Inhale 2 puffs into the lungs every 6 (six) hours as needed for wheezing or shortness of breath.  . allopurinol (ZYLOPRIM) 300 MG tablet Take 300 mg by mouth at bedtime.   Marland Kitchen aspirin EC 81 MG tablet Take 81 mg by mouth daily.  . clonazePAM (KLONOPIN) 1 MG tablet Take 1 mg by mouth at bedtime as needed.  . clopidogrel (PLAVIX) 75 MG tablet Take 75 mg by mouth daily.  Marland Kitchen docusate sodium (COLACE) 100 MG capsule Take 100 mg by mouth 4 (four) times a week. Take 1 capsule (100 mg) by mouth with every iron tablet  . dorzolamide (TRUSOPT) 2 % ophthalmic solution Place 1 drop into both eyes 3 (three) times daily.  . ergocalciferol (VITAMIN D2) 1.25 MG (50000 UT) capsule Take 50,000 Units by mouth once a week.  . fenofibrate (TRICOR) 145 MG tablet Take 145 mg by mouth daily.  . Ferrous Sulfate (  IRON SLOW RELEASE) 143 (45 Fe) MG TBCR Take 143 mg by mouth 4 (four) times a week.  . Fluticasone-Salmeterol (ADVAIR) 100-50 MCG/DOSE AEPB Inhale 2 puffs into the lungs 2 (two) times daily.  . furosemide (LASIX) 20 MG tablet Take 20 mg by mouth 2 (two) times daily.  Marland Kitchen gabapentin (NEURONTIN) 100 MG capsule Take 100 mg by mouth 3 (three) times daily.  Marland Kitchen gabapentin (NEURONTIN) 300 MG capsule Take 300 mg by mouth at bedtime.  Marland Kitchen latanoprost (XALATAN) 0.005 % ophthalmic solution Place 1 drop into both eyes at bedtime.  Marland Kitchen levothyroxine (SYNTHROID) 112 MCG tablet  TAKE 1 TABLET BY MOUTH DAILY EXCEPT TAKE 200MCG TABLET ON SUNDAYS  . metoprolol (LOPRESSOR) 50 MG tablet Take 50 mg by mouth daily.   . metoprolol succinate (TOPROL-XL) 50 MG 24 hr tablet Take 50 mg by mouth daily.  . mirabegron ER (MYRBETRIQ) 50 MG TB24 tablet Take 50 mg by mouth daily.  . Multiple Minerals (CALCIUM-MAGNESIUM-ZINC) TABS Take 1 tablet by mouth every other day.  . omega-3 acid ethyl esters (LOVAZA) 1 g capsule Take 1 capsule by mouth 2 (two) times daily.  . Omega-3 Fatty Acids (FISH OIL) 1200 MG CAPS Take 1,200 mg by mouth 2 (two) times a week.  Marland Kitchen omeprazole (PRILOSEC) 20 MG capsule Take 20 mg by mouth 2 (two) times daily before a meal.   . predniSONE (DELTASONE) 20 MG tablet Take 20 mg by mouth daily as needed.  Marland Kitchen PRESCRIPTION MEDICATION Inhale into the lungs at bedtime. CPAP  . Psyllium (METAMUCIL PO) Take 5 mLs by mouth at bedtime. Mix in liquid and drink  . rOPINIRole (REQUIP) 2 MG tablet Take 2 mg by mouth See admin instructions. Take 1 tablet (2 mg) by mouth daily at bedtime, may also take 1 tablet in the afternoon as needed for restless legs  . spironolactone (ALDACTONE) 25 MG tablet Take 25 mg by mouth daily.    . timolol (TIMOPTIC) 0.5 % ophthalmic solution 1 drop 2 (two) times daily.  . traMADol (ULTRAM) 50 MG tablet Take 50-100 mg by mouth every 4 (four) hours as needed (pain). 1 or 2 every 4 hours as needed for pain  . warfarin (COUMADIN) 5 MG tablet Take 5-7.5 mg by mouth daily before supper. Take 1 1/2 tablets (7.5 mg) by mouth on Wednesday, take 1 tablet (5 mg) on all other days of the week     Allergies:   Atorvastatin calcium, Dorzolamide, Brimonidine, Brinzolamide-brimonidine, Lipitor [atorvastatin calcium], Rotigotine, Tamsulosin, Vancomycin, Zolpidem, and Dutasteride   Social History   Tobacco Use  . Smoking status: Former Research scientist (life sciences)  . Smokeless tobacco: Never Used  Substance Use Topics  . Alcohol use: No  . Drug use: No     Family Hx: The patient's  family history is not on file.  ROS:   Please see the history of present illness.    As mentioned above All other systems reviewed and are negative.   Prior CV studies:   The following studies were reviewed today:  IMPRESSIONS    1. The left ventricle has normal systolic function with an ejection  fraction of 60-65%. The cavity size was normal. There is moderately  increased left ventricular wall thickness. Left ventricular diastolic  Doppler parameters are indeterminate.  2. The right ventricle has normal systolic function. The cavity was  normal. There is no increase in right ventricular wall thickness.  3. Left atrial size was mild-moderately dilated.  4. The mitral valve is degenerative. Moderate thickening  of the mitral  valve leaflet. Moderate calcification of the mitral valve leaflet. Mitral  valve regurgitation is mild to moderate by color flow Doppler. Mild mitral  valve stenosis.  5. The aortic valve is tricuspid Moderate thickening of the aortic valve  Moderate calcification of the aortic valve. Aortic valve regurgitation was  not assessed by color flow Doppler. moderate stenosis of the aortic valve.   Labs/Other Tests and Data Reviewed:    EKG:  EKG from previous evaluations were visited and reviewed  Recent Labs: No results found for requested labs within last 8760 hours.   Recent Lipid Panel Lab Results  Component Value Date/Time   CHOL 187 04/11/2016 03:00 AM   TRIG 424 (H) 04/11/2016 03:00 AM   HDL 22 (L) 04/11/2016 03:00 AM   CHOLHDL 8.5 04/11/2016 03:00 AM   LDLCALC UNABLE TO CALCULATE IF TRIGLYCERIDE OVER 400 mg/dL 04/11/2016 03:00 AM    Wt Readings from Last 3 Encounters:  01/03/20 225 lb (102.1 kg)  08/06/19 225 lb (102.1 kg)  01/19/19 229 lb (103.9 kg)     Objective:    Vital Signs:  Ht 5\' 7"  (1.702 m)   Wt 225 lb (102.1 kg)   BMI 35.24 kg/m    VITAL SIGNS:  reviewed  ASSESSMENT & PLAN:    1. Metallic prosthetic aortic valve,  moderate stenosis by last echocardiogram: Patient is asymptomatic at last time.  He has advanced age.  He has multiple comorbidities he tells me that he is asymptomatic.  We will get a follow-up echocardiogram.  He denies any chest pain, dizziness or any syncopal episodes or shortness of breath. 2. Echocardiogram will be done to assess this is a follow-up. 3. Essential hypertension: Blood pressure is stable 4. Mixed dyslipidemia and diabetes mellitus: Diet was discussed.  He has blood work managed by his primary care physician. 5. Anticoagulation is also managed by primary care physician at the facility.  He tells me that they get routine blood work all the time and he is well informed of it. 6. For his medically grossly cardiac valve I told him that he does not have been doing aspirin Plavix and warfarin in view of his advanced age.  I cut down his medication to only clopidogrel and warfarin. 7. Patient will be seen in follow-up appointment in 6 months or earlier if the patient has any concerns   COVID-19 Education: The signs and symptoms of COVID-19 were discussed with the patient and how to seek care for testing (follow up with PCP or arrange E-visit).  The importance of social distancing was discussed today.  Time:   Today, I have spent 15 minutes with the patient with telehealth technology discussing the above problems.     Medication Adjustments/Labs and Tests Ordered: Current medicines are reviewed at length with the patient today.  Concerns regarding medicines are outlined above.   Tests Ordered: No orders of the defined types were placed in this encounter.   Medication Changes: No orders of the defined types were placed in this encounter.   Follow Up:  Either In Person or Virtual 6 mo  Signed, Jenean Lindau, MD  01/03/2020 10:14 AM    [place]

## 2020-01-04 NOTE — Patient Instructions (Signed)
Medication Instructions:  No medication changes *If you need a refill on your cardiac medications before your next appointment, please call your pharmacy*  Lab Work: No labs ordered If you have labs (blood work) drawn today and your tests are completely normal, you will receive your results only by: Marland Kitchen MyChart Message (if you have MyChart) OR . A paper copy in the mail If you have any lab test that is abnormal or we need to change your treatment, we will call you to review the results.  Testing/Procedures: You have a echo scheduled on 01/15/2020 at 1:15 pm.  Follow-Up: At [facility], you and your health needs are our priority.  As part of our continuing mission to provide you with exceptional heart care, we have created designated Provider Care Teams.  These Care Teams include your primary Cardiologist (physician) and Advanced Practice Providers (APPs -  Physician Assistants and Nurse Practitioners) who all work together to provide you with the care you need, when you need it.  Your next appointment:   6 month(s)  The format for your next appointment:   In Person  Provider:   Jyl Heinz, MD  Other Instructions  Echocardiogram An echocardiogram is a procedure that uses painless sound waves (ultrasound) to produce an image of the heart. Images from an echocardiogram can provide important information about:  Signs of coronary artery disease (CAD).  Aneurysm detection. An aneurysm is a weak or damaged part of an artery wall that bulges out from the normal force of blood pumping through the body.  Heart size and shape. Changes in the size or shape of the heart can be associated with certain conditions, including heart failure, aneurysm, and CAD.  Heart muscle function.  Heart valve function.  Signs of a past heart attack.  Fluid buildup around the heart.  Thickening of the heart muscle.  A tumor or infectious growth around the heart valves. Tell a health care provider  about:  Any allergies you have.  All medicines you are taking, including vitamins, herbs, eye drops, creams, and over-the-counter medicines.  Any blood disorders you have.  Any surgeries you have had.  Any medical conditions you have.  Whether you are pregnant or may be pregnant. What are the risks? Generally, this is a safe procedure. However, problems may occur, including:  Allergic reaction to dye (contrast) that may be used during the procedure. What happens before the procedure? No specific preparation is needed. You may eat and drink normally. What happens during the procedure?   An IV tube may be inserted into one of your veins.  You may receive contrast through this tube. A contrast is an injection that improves the quality of the pictures from your heart.  A gel will be applied to your chest.  A wand-like tool (transducer) will be moved over your chest. The gel will help to transmit the sound waves from the transducer.  The sound waves will harmlessly bounce off of your heart to allow the heart images to be captured in real-time motion. The images will be recorded on a computer. The procedure may vary among health care providers and hospitals. What happens after the procedure?  You may return to your normal, everyday life, including diet, activities, and medicines, unless your health care provider tells you not to do that. Summary  An echocardiogram is a procedure that uses painless sound waves (ultrasound) to produce an image of the heart.  Images from an echocardiogram can provide important information about the  size and shape of your heart, heart muscle function, heart valve function, and fluid buildup around your heart.  You do not need to do anything to prepare before this procedure. You may eat and drink normally.  After the echocardiogram is completed, you may return to your normal, everyday life, unless your health care provider tells you not to do  that. This information is not intended to replace advice given to you by your health care provider. Make sure you discuss any questions you have with your health care provider. Document Revised: 02/22/2019 Document Reviewed: 12/04/2016 Elsevier Patient Education  [place].

## 2020-01-15 ENCOUNTER — Ambulatory Visit (HOSPITAL_BASED_OUTPATIENT_CLINIC_OR_DEPARTMENT_OTHER): Admission: RE | Admit: 2020-01-15 | Payer: Medicare Other | Source: Ambulatory Visit

## 2020-04-30 ENCOUNTER — Other Ambulatory Visit: Payer: Self-pay

## 2020-05-12 ENCOUNTER — Ambulatory Visit: Payer: Medicare Other | Admitting: Cardiology

## 2020-09-22 ENCOUNTER — Other Ambulatory Visit: Payer: Self-pay

## 2020-09-22 DIAGNOSIS — Z96619 Presence of unspecified artificial shoulder joint: Secondary | ICD-10-CM | POA: Insufficient documentation

## 2020-09-22 DIAGNOSIS — I219 Acute myocardial infarction, unspecified: Secondary | ICD-10-CM | POA: Insufficient documentation

## 2020-09-22 DIAGNOSIS — I350 Nonrheumatic aortic (valve) stenosis: Secondary | ICD-10-CM | POA: Insufficient documentation

## 2020-09-22 DIAGNOSIS — I10 Essential (primary) hypertension: Secondary | ICD-10-CM | POA: Insufficient documentation

## 2020-09-23 ENCOUNTER — Encounter: Payer: Self-pay | Admitting: Cardiology

## 2020-09-23 ENCOUNTER — Other Ambulatory Visit: Payer: Self-pay

## 2020-09-23 ENCOUNTER — Ambulatory Visit (INDEPENDENT_AMBULATORY_CARE_PROVIDER_SITE_OTHER): Payer: Medicare Other | Admitting: Cardiology

## 2020-09-23 VITALS — BP 152/74 | HR 64 | Ht 63.0 in | Wt 222.0 lb

## 2020-09-23 DIAGNOSIS — I10 Essential (primary) hypertension: Secondary | ICD-10-CM | POA: Diagnosis not present

## 2020-09-23 DIAGNOSIS — I2510 Atherosclerotic heart disease of native coronary artery without angina pectoris: Secondary | ICD-10-CM

## 2020-09-23 DIAGNOSIS — Z954 Presence of other heart-valve replacement: Secondary | ICD-10-CM | POA: Diagnosis not present

## 2020-09-23 DIAGNOSIS — E782 Mixed hyperlipidemia: Secondary | ICD-10-CM | POA: Diagnosis not present

## 2020-09-23 NOTE — Progress Notes (Signed)
Cardiology Office Note:    Date:  09/23/2020   ID:  Peter Beltran, DOB Sep 16, 1925, MRN [id number]  PCP:  Peter Glazier, MD  Cardiologist:  Peter Lindau, MD   Referring MD: Peter Glazier, MD    ASSESSMENT:    1. Chronic coronary artery disease   2. Essential hypertension   3. Status post aortic valve replacement with metallic valve   4. Mixed dyslipidemia    PLAN:    In order of problems listed above:  1. Coronary artery disease: Secondary prevention stressed with the patient.  Importance of compliance with diet medication stressed and vocalized understanding.  He ambulates well. 2. Essential hypertension: Blood pressure stable and diet was emphasized 3. Metallic aortic valve: Stable and continue to monitor.  Anticoagulation managed by primary care physician. 4. Mixed dyslipidemia: Lipids managed by primary physician and diet was emphasized. 5. Patient will be seen in follow-up appointment in 6 months or earlier if the patient has any concerns    Medication Adjustments/Labs and Tests Ordered: Current medicines are reviewed at length with the patient today.  Concerns regarding medicines are outlined above.  No orders of the defined types were placed in this encounter.  No orders of the defined types were placed in this encounter.    No chief complaint on file.    History of Present Illness:    Peter Beltran is a 86 y.o. male.  Patient has past medical history of aortic valve replacement with Saint Jude valve.,  Essential hypertension and dyslipidemia.  He denies any problems at this time and takes care of activities of daily living.  He has coronary artery disease.  At the time of my evaluation, the patient is alert awake oriented and in no distress.  Past Medical History:  Diagnosis Date  . Acute kidney failure, unspecified ([place]) 07/22/2019  . Acute maxillary sinusitis, unspecified 08/16/2017  . Anxiety state 09/17/2013  . Aortic stenosis    post  aortic valve replacement with St.Jude's valve by Dr.VanTrigt on 07-27-11  . Aortic valve disorder 05/29/2013  . Candidal stomatitis 08/16/2017  . Cellulitis, unspecified 07/25/2019  . Chest pain 04/10/2016  . Chronic airway obstruction ([place]) 04/21/2013  . Chronic coronary artery disease 04/10/2016   Formatting of this note might be different from the original. Overview:  status post CABG x3 in 2008 Formatting of this note might be different from the original. Overview:  Overview:  status post CABG x3 in 2008  . Chronic rhinitis 12/27/2019   Last Assessment & Plan:  Concern over his nose. Chronic recurring nasal congestion.  Had a severe episode recently that responded to over-the-counter medication.  Currently asymptomatic.  Wants to make sure all is okay. EXAM intranasally shows no polyps, purulence, masses or obstructing anatomy.  Mucosa is overall healthy. PLAN: Reassured all looks okay.  I think what he is doing is fine.  Happy t  . Constipation 08/16/2017  . Coronary artery disease    status post CABG x3 in 2008  . Cough 10/20/2016  . Diabetic polyneuropathy associated with type 2 diabetes mellitus ([place]) 10/04/2016  . Disorder of the skin and subcutaneous tissue, unspecified 07/25/2019  . Disturbance of salivary secretion 04/21/2013  . Dyslipidemia   . Dysphagia 04/21/2013  . Encounter for current long-term use of anticoagulants 05/29/2013   Formatting of this note might be different from the original. IMO routine update Formatting of this note might be different from the original. Overview:  IMO routine update  .  Enlarged prostate with lower urinary tract symptoms (LUTS) 05/29/2013  . Esophageal reflux 02/26/2014  . Essential hypertension 01/19/2019  . Follow-up visit for aortic valve replacement with metallic valve 11/16/4578  . Foreign body aspiration 03/05/2016  . Gout   . Gouty arthropathy 05/29/2013  . Hidrocystoma of left eyelid 12/10/2014  . Hyperplasia of prostate    benign  . Hypersomnia 06/14/2013   . Hypertension   . Hypertriglyceridemia 11/11/2016  . Hypothyroidism   . Impacted cerumen, bilateral 08/16/2017  . Impaired fasting glucose 04/21/2013  . Iron deficiency anemia, unspecified 08/16/2017  . Ischemic optic neuropathy of both eyes 01/28/2016  . Localized edema 07/25/2019  . Long term (current) use of anticoagulants 05/29/2013   Overview:  IMO routine update IMO routine update  . Long term (current) use of aspirin 08/16/2017  . Low back pain 08/16/2017  . Low-tension glaucoma of both eyes, 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/2016   tmax 12/14 OCT 11/03/2016 VF 12/04/2015 Gonio 05/07/2015 tmax 12/14 OCT 11/03/2016 VF 12/04/2015 Gonio 05/07/2015  . Lumbar stenosis 04/30/2015  . Memory loss, short term 03/30/2016  . Mild cognitive impairment 08/30/2016  . Mixed dyslipidemia 01/19/2019  . Moderate persistent asthma without complication 9/98/3382  . Monitoring for long-term anticoagulant use 08/03/2017  . Myocardial infarct ([place])   . Nasal congestion 02/04/2018  . Neuropathy 04/04/2014  . Obstructive sleep apnea of adult 12/17/2013  . Osteoarthrosis 04/21/2013  . Other malaise 07/25/2019  . Overweight 01/19/2019  . Pain in the chest   . Pain in thoracic spine 10/18/2018  . Periodic limb movements of sleep 08/26/2013  . Presence of aortocoronary bypass graft 10/18/2018  . Presence of prosthetic heart valve 10/18/2018  . Pseudophakia of both eyes 12/10/2014  . Psoriasis and similar disorder 01/28/2014  . Rash and other nonspecific skin eruption 08/16/2017  . 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/28/2013  . Spells 08/26/2013  . Status post aortic valve replacement with metallic valve 5/0/5397  . Status post shoulder joint replacement    right shoulder  . Subtherapeutic anticoagulation 04/10/2016  . Syncope and collapse 06/14/2013   Overview:  with seizure like activity. with seizure like activity.  . Type 2 diabetes mellitus with diabetic neuropathy, unspecified ([place]) 08/16/2017  . Unspecified abnormalities of gait and  mobility 10/18/2018  . Unspecified atrial fibrillation ([place]) 03/30/2017  . Unspecified urinary incontinence 05/17/2018  . Urinary retention 07/18/2018  . Vitamin D deficiency, unspecified 08/16/2017    Past Surgical History:  Procedure Laterality Date  . AORTIC VALVE REPLACEMENT    . CORONARY ARTERY BYPASS GRAFT    . PROSTATE ABLATION    . TONSILLECTOMY      Current Medications: Current Meds  Medication Sig  . acetaminophen (TYLENOL 8 HOUR) 650 MG CR tablet   . albuterol (PROVENTIL HFA;VENTOLIN HFA) 108 (90 Base) MCG/ACT inhaler Inhale 2 puffs into the lungs every 6 (six) hours as needed for wheezing or shortness of breath.  . allopurinol (ZYLOPRIM) 300 MG tablet Take 300 mg by mouth at bedtime.   Marland Kitchen aspirin EC 81 MG tablet Take 81 mg by mouth daily.  . clonazePAM (KLONOPIN) 1 MG tablet Take 1 mg by mouth at bedtime as needed.  . clopidogrel (PLAVIX) 75 MG tablet Take 75 mg by mouth daily.  Marland Kitchen docusate sodium (COLACE) 100 MG capsule Take 100 mg by mouth 4 (four) times a week. Take 1 capsule (100 mg) by mouth with every iron tablet  . dorzolamide (TRUSOPT) 2 % ophthalmic solution Place 1 drop into both eyes  3 (three) times daily.  . ergocalciferol (VITAMIN D2) 1.25 MG (50000 UT) capsule Take 50,000 Units by mouth once a week.  . fenofibrate (TRICOR) 145 MG tablet Take 145 mg by mouth daily.  . Ferrous Sulfate (IRON SLOW RELEASE) 143 (45 Fe) MG TBCR Take 143 mg by mouth 4 (four) times a week.  . Fluticasone-Salmeterol (ADVAIR) 100-50 MCG/DOSE AEPB Inhale 2 puffs into the lungs 2 (two) times daily.  . furosemide (LASIX) 20 MG tablet Take 20 mg by mouth 2 (two) times daily.  Marland Kitchen gabapentin (NEURONTIN) 100 MG capsule Take 100 mg by mouth daily.  Marland Kitchen gabapentin (NEURONTIN) 300 MG capsule Take 300 mg by mouth at bedtime.  . IRON SLOW RELEASE 142 (45 Fe) MG TBCR TAKE ONE (1) TABLET BY MOUTH EVERY DAY  . latanoprost (XALATAN) 0.005 % ophthalmic solution Place 1 drop into both eyes at bedtime.  Marland Kitchen  levothyroxine (SYNTHROID) 112 MCG tablet TAKE 1 TABLET BY MOUTH DAILY EXCEPT TAKE 200MCG TABLET ON SUNDAYS  . levothyroxine (SYNTHROID) 200 MCG tablet Take 200 mcg by mouth once a week.  Marland Kitchen lisinopril (ZESTRIL) 5 MG tablet Take 5 mg by mouth daily.  Marland Kitchen loratadine (CLARITIN) 10 MG tablet Take 1 tablet by mouth daily.  . metoprolol (LOPRESSOR) 50 MG tablet Take 50 mg by mouth daily.   . metoprolol succinate (TOPROL-XL) 50 MG 24 hr tablet Take 50 mg by mouth daily.  . mirabegron ER (MYRBETRIQ) 50 MG TB24 tablet Take 50 mg by mouth daily.  . Multiple Minerals (CALCIUM-MAGNESIUM-ZINC) TABS Take 1 tablet by mouth every other day.  . omega-3 acid ethyl esters (LOVAZA) 1 g capsule Take 1 capsule by mouth 2 (two) times daily.  . Omega-3 Fatty Acids (FISH OIL) 1200 MG CAPS Take 1,200 mg by mouth 2 (two) times a week.  Marland Kitchen omeprazole (PRILOSEC) 20 MG capsule Take 20 mg by mouth 2 (two) times daily before a meal.   . predniSONE (DELTASONE) 20 MG tablet Take 20 mg by mouth daily as needed.  Marland Kitchen PRESCRIPTION MEDICATION Inhale into the lungs at bedtime. CPAP  . Psyllium (METAMUCIL PO) Take 5 mLs by mouth at bedtime. Mix in liquid and drink  . rOPINIRole (REQUIP) 2 MG tablet Take 2 mg by mouth See admin instructions. Take 1 tablet (2 mg) by mouth daily at bedtime, may also take 1 tablet in the afternoon as needed for restless legs  . rOPINIRole (REQUIP) 2 MG tablet Take 2 mg by mouth at bedtime.  Marland Kitchen spironolactone (ALDACTONE) 25 MG tablet Take 25 mg by mouth daily.    . timolol (TIMOPTIC) 0.5 % ophthalmic solution 1 drop 2 (two) times daily.  . traMADol (ULTRAM) 50 MG tablet Take 50-100 mg by mouth every 4 (four) hours as needed (pain). 1 or 2 every 4 hours as needed for pain  . warfarin (COUMADIN) 4 MG tablet Take 4 mg by mouth 2 (two) times a week.     Allergies:   Atorvastatin calcium, Dorzolamide, Brimonidine, Brinzolamide-brimonidine, Lipitor [atorvastatin calcium], Rotigotine, Tamsulosin, Vancomycin, Zolpidem,  and Dutasteride   Social History   Socioeconomic History  . Marital status: Married    Spouse name: Not on file  . Number of children: Not on file  . Years of education: Not on file  . Highest education level: Not on file  Occupational History  . Not on file  Tobacco Use  . Smoking status: Former Research scientist (life sciences)  . Smokeless tobacco: Never Used  Substance and Sexual Activity  . Alcohol use: No  .  Drug use: No  . Sexual activity: Not on file  Other Topics Concern  . Not on file  Social History Narrative  . Not on file   Social Determinants of Health   Financial Resource Strain:   . Difficulty of Paying Living Expenses: Not on file  Food Insecurity:   . Worried About Charity fundraiser in the Last Year: Not on file  . Ran Out of Food in the Last Year: Not on file  Transportation Needs:   . Lack of Transportation (Medical): Not on file  . Lack of Transportation (Non-Medical): Not on file  Physical Activity:   . Days of Exercise per Week: Not on file  . Minutes of Exercise per Session: Not on file  Stress:   . Feeling of Stress : Not on file  Social Connections:   . Frequency of Communication with Friends and Family: Not on file  . Frequency of Social Gatherings with Friends and Family: Not on file  . Attends Religious Services: Not on file  . Active Member of Clubs or Organizations: Not on file  . Attends Archivist Meetings: Not on file  . Marital Status: Not on file     Family History: The patient's family history is negative for Hypertension, Diabetes, Cancer, and Heart disease.  ROS:   Please see the history of present illness.    All other systems reviewed and are negative.  EKGs/Labs/Other Studies Reviewed:    The following studies were reviewed today: EKG reveals sinus rhythm and nonspecific ST-T changes. I discussed my findings with the patient at length.   Recent Labs: No results found for requested labs within last 8760 hours.  Recent Lipid  Panel    Component Value Date/Time   CHOL 187 04/11/2016 0300   TRIG 424 (H) 04/11/2016 0300   HDL 22 (L) 04/11/2016 0300   CHOLHDL 8.5 04/11/2016 0300   VLDL UNABLE TO CALCULATE IF TRIGLYCERIDE OVER 400 mg/dL 04/11/2016 0300   LDLCALC UNABLE TO CALCULATE IF TRIGLYCERIDE OVER 400 mg/dL 04/11/2016 0300    Physical Exam:    VS:  BP (!) 152/74   Pulse 64   Ht 5\' 3"  (1.6 m)   Wt 222 lb 0.6 oz (100.7 kg)   SpO2 96%   BMI 39.33 kg/m     Wt Readings from Last 3 Encounters:  09/23/20 222 lb 0.6 oz (100.7 kg)  01/03/20 225 lb (102.1 kg)  08/06/19 225 lb (102.1 kg)     GEN: Patient is in no acute distress HEENT: Normal NECK: No JVD; No carotid bruits LYMPHATICS: No lymphadenopathy CARDIAC: Hear sounds regular, 2/6 systolic murmur at the apex. RESPIRATORY:  Clear to auscultation without rales, wheezing or rhonchi  ABDOMEN: Soft, non-tender, non-distended MUSCULOSKELETAL:  No edema; No deformity  SKIN: Warm and dry NEUROLOGIC:  Alert and oriented x 3 PSYCHIATRIC:  Normal affect   Signed, Peter Lindau, MD  09/23/2020 11:31 AM    [place]

## 2020-09-23 NOTE — Patient Instructions (Signed)

## 2020-10-28 DIAGNOSIS — S066X0D Traumatic subarachnoid hemorrhage without loss of consciousness, subsequent encounter: Secondary | ICD-10-CM

## 2020-10-28 DIAGNOSIS — R41841 Cognitive communication deficit: Secondary | ICD-10-CM

## 2020-10-28 DIAGNOSIS — M6281 Muscle weakness (generalized): Secondary | ICD-10-CM

## 2020-10-28 DIAGNOSIS — J3489 Other specified disorders of nose and nasal sinuses: Secondary | ICD-10-CM | POA: Insufficient documentation

## 2020-10-28 DIAGNOSIS — M6389 Disorders of muscle in diseases classified elsewhere, multiple sites: Secondary | ICD-10-CM

## 2020-10-28 DIAGNOSIS — Z9181 History of falling: Secondary | ICD-10-CM | POA: Insufficient documentation

## 2020-10-28 DIAGNOSIS — K1230 Oral mucositis (ulcerative), unspecified: Secondary | ICD-10-CM

## 2020-10-28 HISTORY — DX: Disorders of muscle in diseases classified elsewhere, multiple sites: M63.89

## 2020-10-28 HISTORY — DX: History of falling: Z91.81

## 2020-10-28 HISTORY — DX: Muscle weakness (generalized): M62.81

## 2020-10-28 HISTORY — DX: Cognitive communication deficit: R41.841

## 2020-10-28 HISTORY — DX: Other specified disorders of nose and nasal sinuses: J34.89

## 2020-10-28 HISTORY — DX: Oral mucositis (ulcerative), unspecified: K12.30

## 2020-10-28 HISTORY — DX: Traumatic subarachnoid hemorrhage without loss of consciousness, subsequent encounter: S06.6X0D

## 2020-11-10 DIAGNOSIS — L299 Pruritus, unspecified: Secondary | ICD-10-CM | POA: Insufficient documentation

## 2020-11-10 DIAGNOSIS — U071 COVID-19: Secondary | ICD-10-CM

## 2020-11-10 DIAGNOSIS — R17 Unspecified jaundice: Secondary | ICD-10-CM

## 2020-11-10 HISTORY — DX: Pruritus, unspecified: L29.9

## 2020-11-10 HISTORY — DX: Unspecified jaundice: R17

## 2020-11-10 HISTORY — DX: COVID-19: U07.1

## 2020-12-09 ENCOUNTER — Other Ambulatory Visit: Payer: Self-pay

## 2020-12-12 ENCOUNTER — Other Ambulatory Visit: Payer: Self-pay

## 2020-12-12 ENCOUNTER — Ambulatory Visit (INDEPENDENT_AMBULATORY_CARE_PROVIDER_SITE_OTHER): Payer: Medicare Other | Admitting: Cardiology

## 2020-12-12 ENCOUNTER — Encounter: Payer: Self-pay | Admitting: Cardiology

## 2020-12-12 VITALS — BP 108/50 | HR 80 | Ht 68.0 in | Wt 218.1 lb

## 2020-12-12 DIAGNOSIS — I2510 Atherosclerotic heart disease of native coronary artery without angina pectoris: Secondary | ICD-10-CM

## 2020-12-12 DIAGNOSIS — Z954 Presence of other heart-valve replacement: Secondary | ICD-10-CM

## 2020-12-12 DIAGNOSIS — E782 Mixed hyperlipidemia: Secondary | ICD-10-CM | POA: Diagnosis not present

## 2020-12-12 DIAGNOSIS — E1140 Type 2 diabetes mellitus with diabetic neuropathy, unspecified: Secondary | ICD-10-CM | POA: Diagnosis not present

## 2020-12-12 DIAGNOSIS — I10 Essential (primary) hypertension: Secondary | ICD-10-CM | POA: Diagnosis not present

## 2020-12-12 DIAGNOSIS — Z951 Presence of aortocoronary bypass graft: Secondary | ICD-10-CM

## 2020-12-12 NOTE — Progress Notes (Signed)
Cardiology Office Note:    Date:  12/12/2020   ID:  [name], DOB [birth year], MRN [id number]  PCP:  Peter Glazier, MD  Cardiologist:  Peter Lindau, MD   Referring MD: Peter Glazier, MD    ASSESSMENT:    1. Atherosclerosis of native coronary artery of native heart without angina pectoris   2. Essential hypertension   3. Mixed hyperlipidemia   4. Type 2 diabetes mellitus with diabetic neuropathy, without long-term current use of insulin (HCC)   5. 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 Presence of aortocoronary bypass graft    PLAN:    In order of problems listed above:  1. Coronary artery disease: Secondary prevention stressed with the patient.  Importance of compliance with diet medication stressed any vocalized understanding.  He ambulates age appropriately. 2. Subdural hematoma and metallic aortic valve: I discussed my findings with the patient at extensive length.  The patient is on anticoagulation.  His warfarin was discontinued and he was initiated on Eliquis.  I am not clear about the reasons.  I discussed her case with his primary care provider Ms Peter Post NP.Marland Kitchen  Patient needs to be on anticoagulation with warfarin and Eliquis needs to be discontinued and she understands.  She will talk to her primary care physician and also discussed this and initiate and do the needful.  They will be in charge of anticoagulation. 3. Essential hypertension: Blood pressure stable and diet was emphasized. 4. Blood pressure is borderline but the patient is asymptomatic for this.  He has not had a fall. 5. Patient will be seen in follow-up appointment in 6 months or earlier if the patient has any concerns.  Patient and his nurse practitioner had multiple questions which were answered to their satisfaction.  Total time for this evaluation and the discussions and communication was 45 minutes.    Medication Adjustments/Labs and Tests Ordered: Current  medicines are reviewed at length with the patient today.  Concerns regarding medicines are outlined above.  No orders of the defined types were placed in this encounter.  No orders of the defined types were placed in this encounter.    No chief complaint on file.    History of Present Illness:    Peter Beltran is a 87 y.o. male.  Patient has past medical history of essential hypertension, metallic aortic valve Saint Jude valve in 2012, coronary artery disease and hypertension.  He denies any problems at this time and takes care of activities of daily living.  He is a little forgetful.  Patient mentions to me that he had a subdural hematoma and was admitted.  I reviewed those records.  Subsequently was discharged home on Eliquis for unclear reasons.  At the time of my evaluation, the patient is alert awake oriented and in no distress.  Past Medical History:  Diagnosis Date  . Acute kidney failure, unspecified ([place]) 07/22/2019  . Acute maxillary sinusitis, unspecified 08/16/2017  . Allergic rhinitis, unspecified 02/04/2018  . Anxiety disorder, unspecified 09/17/2013  . Anxiety state 09/17/2013  . Aortic stenosis    Beltran aortic valve replacement with St.Jude's valve by Dr.VanTrigt on 07-27-11  . Aortic valve disorder 05/29/2013  . Atherosclerotic heart disease of native coronary artery without angina pectoris 08/16/2017  . Benign prostatic hyperplasia with lower urinary tract symptoms 08/16/2017  . Candidal stomatitis 08/16/2017  . Cellulitis, unspecified 07/25/2019  . Chest pain 04/10/2016  . Chronic airway obstruction ([place]) 04/21/2013  .  Chronic coronary artery disease 04/10/2016   Formatting of this note might be different from the original. Overview:  status Beltran CABG x3 in 2008 Formatting of this note might be different from the original. Overview:  Overview:  status Beltran CABG x3 in 2008  . Chronic rhinitis 12/27/2019   Last Assessment & Plan:  Concern over his nose. Chronic recurring nasal  congestion.  Had a severe episode recently that responded to over-the-counter medication.  Currently asymptomatic.  Wants to make sure all is okay. EXAM intranasally shows no polyps, purulence, masses or obstructing anatomy.  Mucosa is overall healthy. PLAN: Reassured all looks okay.  I think what he is doing is fine.  Happy t  . Cognitive communication deficit 10/28/2020  . Constipation 08/16/2017  . Constipation, unspecified 08/16/2017  . Coronary artery disease    status Beltran CABG x3 in 2008  . Cough 10/20/2016  . Diabetic polyneuropathy associated with type 2 diabetes mellitus ([place]) 10/04/2016  . Disorder of the skin and subcutaneous tissue, unspecified 07/25/2019  . Disorders of muscle in diseases classified elsewhere, multiple sites 10/28/2020  . Disturbance of salivary secretion 04/21/2013  . Dry mouth, unspecified 10/18/2018  . Dyslipidemia   . Dysphagia 04/21/2013  . Encounter for current long-term use of anticoagulants 05/29/2013   Formatting of this note might be different from the original. IMO routine update Formatting of this note might be different from the original. Overview:  IMO routine update  . Encounter for immunization 07/25/2019  . Enlarged prostate with lower urinary tract symptoms (LUTS) 05/29/2013  . Esophageal reflux 02/26/2014  . Essential hypertension 01/19/2019  . Follow-up visit for aortic valve replacement with metallic valve 02/14/7061  . Foreign body aspiration 03/05/2016  . Gastro-esophageal reflux disease without esophagitis 02/26/2014  . Generalized edema 10/18/2018  . Gout   . Gouty arthropathy 05/29/2013  . Hidrocystoma of left eyelid 12/10/2014  . History of falling 10/28/2020  . Hyperlipidemia, unspecified 11/11/2016  . Hyperplasia of prostate    benign  . Hypersomnia 06/14/2013  . Hypertension   . Hypertensive heart disease with heart failure ([place]) 10/18/2018  . Hypertriglyceridemia 11/11/2016  . Hypothyroidism   . Hypothyroidism, unspecified 04/10/2016  . Impacted  cerumen, bilateral 08/16/2017  . Impaired fasting glucose 04/21/2013  . Iron deficiency anemia, unspecified 08/16/2017  . Ischemic optic neuropathy of both eyes 01/28/2016  . Localized edema 07/25/2019  . Long term (current) use of anticoagulants 05/29/2013   Overview:  IMO routine update IMO routine update  . Long term (current) use of aspirin 08/16/2017  . Low back pain 08/16/2017  . Low-tension glaucoma of both eyes, 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/2016   tmax 12/14 OCT 11/03/2016 VF 12/04/2015 Gonio 05/07/2015 tmax 12/14 OCT 11/03/2016 VF 12/04/2015 Gonio 05/07/2015  . Low-tension glaucoma, bilateral, 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/2016   tmax 12/14 OCT 11/03/2016 VF 12/04/2015 Gonio 05/07/2015 tmax 12/14 OCT 11/03/2016 VF 12/04/2015 Gonio 05/07/2015  . Lumbar stenosis 04/30/2015  . Memory loss, short term 03/30/2016  . Mild cognitive impairment 08/30/2016  . Mixed dyslipidemia 01/19/2019  . Moderate persistent asthma without complication 3/76/2831  . Moderate persistent asthma, uncomplicated 04/01/6159  . Monitoring for long-term anticoagulant use 08/03/2017  . Muscle weakness (generalized) 10/28/2020  . Myocardial infarct ([place])   . Nasal congestion 02/04/2018  . Neuropathy 04/04/2014  . Obstructive sleep apnea 12/17/2013  . Obstructive sleep apnea of adult 12/17/2013  . Old myocardial infarction 08/22/2019  . Oral mucositis (ulcerative), unspecified 10/28/2020  . Osteoarthrosis 04/21/2013  . Other malaise 07/25/2019  .  Other specified disorders of nose and nasal sinuses 10/28/2020  . Overweight 01/19/2019  . Pain in the chest   . Pain in thoracic spine 10/18/2018  . Periodic limb movements of sleep 08/26/2013  . Presence of aortocoronary bypass graft 10/18/2018  . Presence of prosthetic heart valve 10/18/2018  . Pseudophakia of both eyes 12/10/2014  . Psoriasis and similar disorder 01/28/2014  . Rash and other nonspecific skin eruption 08/16/2017  . 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/28/2013  . Restless legs syndrome 08/26/2013  .  Spells 08/26/2013  . Spinal stenosis, lumbar region without neurogenic claudication 04/30/2015  . 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 Status Beltran shoulder joint replacement    right shoulder  . Subtherapeutic anticoagulation 04/10/2016  . Syncope and collapse 06/14/2013   Overview:  with seizure like activity. with seizure like activity.  . Traumatic subarachnoid hemorrhage without loss of consciousness, subsequent encounter 10/28/2020  . Type 2 diabetes mellitus with diabetic neuropathy, unspecified ([place]) 08/16/2017  . Unspecified abnormalities of gait and mobility 10/18/2018  . Unspecified atrial fibrillation ([place]) 03/30/2017  . Unspecified osteoarthritis, unspecified site 04/21/2013  . Unspecified urinary incontinence 05/17/2018  . Urinary retention 07/18/2018  . Vitamin D deficiency, unspecified 08/16/2017    Past Surgical History:  Procedure Laterality Date  . AORTIC VALVE REPLACEMENT    . CORONARY ARTERY BYPASS GRAFT    . PROSTATE ABLATION    . TONSILLECTOMY      Current Medications: Current Meds  Medication Sig  . albuterol (PROVENTIL HFA;VENTOLIN HFA) 108 (90 Base) MCG/ACT inhaler Inhale 2 puffs into the lungs every 6 (six) hours as needed for wheezing or shortness of breath.  . allopurinol (ZYLOPRIM) 300 MG tablet Take 300 mg by mouth at bedtime.  Marland Kitchen apixaban (ELIQUIS) 5 MG TABS tablet Take 5 mg by mouth 2 (two) times daily.  . clonazePAM (KLONOPIN) 1 MG tablet Take 1 mg by mouth at bedtime as needed.  Mariane Baumgarten Sodium (DSS) 100 MG CAPS Take 100 mg by mouth as needed.  . ergocalciferol (VITAMIN D2) 1.25 MG (50000 UT) capsule Take 50,000 Units by mouth once a week.  . fenofibrate (TRICOR) 145 MG tablet Take 145 mg by mouth daily.  . Ferrous Sulfate 143 (45 Fe) MG TBCR Take 143 mg by mouth 4 (four) times a week.  . Fluticasone-Salmeterol (ADVAIR) 100-50 MCG/DOSE AEPB Inhale 2 puffs into the lungs 2 (two) times daily.  Marland Kitchen gabapentin (NEURONTIN) 100 MG  capsule Take 100 mg by mouth daily.  Marland Kitchen gabapentin (NEURONTIN) 300 MG capsule Take 300 mg by mouth at bedtime.  Marland Kitchen GOODSENSE PAIN RELIEF 325 MG tablet Take 975 mg by mouth daily.  Marland Kitchen latanoprost (XALATAN) 0.005 % ophthalmic solution Place 1 drop into both eyes at bedtime.  Marland Kitchen levothyroxine (SYNTHROID) 112 MCG tablet Take 112 mcg by mouth once a week. 6 times a week  . levothyroxine (SYNTHROID) 200 MCG tablet Take 200 mcg by mouth every 7 (seven) days.  Marland Kitchen loratadine (CLARITIN) 10 MG tablet Take 1 tablet by mouth daily.  . metoprolol succinate (TOPROL-XL) 50 MG 24 hr tablet Take 50 mg by mouth 2 (two) times daily.  Marland Kitchen omega-3 acid ethyl esters (LOVAZA) 1 g capsule Take 1 capsule by mouth 2 (two) times daily.  Marland Kitchen omeprazole (PRILOSEC) 20 MG capsule Take 20 mg by mouth 2 (two) times daily before a meal.  . predniSONE (DELTASONE) 20 MG tablet Take 20 mg by mouth daily as needed.  Marland Kitchen PRESCRIPTION MEDICATION Inhale  into the lungs at bedtime. CPAP  . rOPINIRole (REQUIP) 0.5 MG tablet Take 0.5 mg by mouth daily.  Marland Kitchen spironolactone (ALDACTONE) 25 MG tablet Take 25 mg by mouth daily.  . timolol (TIMOPTIC) 0.5 % ophthalmic solution 1 drop 2 (two) times daily.     Allergies:   Atorvastatin calcium, Dorzolamide, Brimonidine, Brinzolamide-brimonidine, Lipitor [atorvastatin calcium], Rotigotine, Tamsulosin, Vancomycin, Zolpidem, and Dutasteride   Social History   Socioeconomic History  . Marital status: Married    Spouse name: Not on file  . Number of children: Not on file  . Years of education: Not on file  . Highest education level: Not on file  Occupational History  . Not on file  Tobacco Use  . Smoking status: Former Research scientist (life sciences)  . Smokeless tobacco: Never Used  Substance and Sexual Activity  . Alcohol use: No  . Drug use: No  . Sexual activity: Not on file  Other Topics Concern  . Not on file  Social History Narrative  . Not on file   Social Determinants of Health   Financial Resource Strain: Not  on file  Food Insecurity: Not on file  Transportation Needs: Not on file  Physical Activity: Not on file  Stress: Not on file  Social Connections: Not on file     Family History: The patient's family history is negative for Hypertension, Diabetes, Cancer, and Heart disease.  ROS:   Please see the history of present illness.    All other systems reviewed and are negative.  EKGs/Labs/Other Studies Reviewed:    The following studies were reviewed today: I discussed my findings with the patient in extensive length   Recent Labs: No results found for requested labs within last 8760 hours.  Recent Lipid Panel    Component Value Date/Time   CHOL 187 04/11/2016 0300   TRIG 424 (H) 04/11/2016 0300   HDL 22 (L) 04/11/2016 0300   CHOLHDL 8.5 04/11/2016 0300   VLDL UNABLE TO CALCULATE IF TRIGLYCERIDE OVER 400 mg/dL 04/11/2016 0300   LDLCALC UNABLE TO CALCULATE IF TRIGLYCERIDE OVER 400 mg/dL 04/11/2016 0300    Physical Exam:    VS:  BP (!) 108/50   Pulse 80   Ht 5\' 8"  (1.727 m)   Wt 218 lb 1.9 oz (98.9 kg)   SpO2 97%   BMI 33.17 kg/m     Wt Readings from Last 3 Encounters:  12/12/20 218 lb 1.9 oz (98.9 kg)  09/23/20 222 lb 0.6 oz (100.7 kg)  01/03/20 225 lb (102.1 kg)     GEN: Patient is in no acute distress HEENT: Normal NECK: No JVD; No carotid bruits LYMPHATICS: No lymphadenopathy CARDIAC: Hear sounds regular, 2/6 systolic murmur at the apex. RESPIRATORY:  Clear to auscultation without rales, wheezing or rhonchi  ABDOMEN: Soft, non-tender, non-distended MUSCULOSKELETAL:  No edema; No deformity  SKIN: Warm and dry NEUROLOGIC:  Alert and oriented x 3 PSYCHIATRIC:  Normal affect   Signed, Peter Lindau, MD  12/12/2020 8:34 AM    [facility]

## 2020-12-12 NOTE — Patient Instructions (Signed)
Medication Instructions:  No medication changes. *If you need a refill on your cardiac medications before your next appointment, please call your pharmacy*   Lab Work: None ordered If you have labs (blood work) drawn today and your tests are completely normal, you will receive your results only by: Marland Kitchen MyChart Message (if you have MyChart) OR . A paper copy in the mail If you have any lab test that is abnormal or we need to change your treatment, we will call you to review the results.   Testing/Procedures: Your physician has requested that you have an echocardiogram. Echocardiography is a painless test that uses sound waves to create images of your heart. It provides your doctor with information about the size and shape of your heart and how well your heart's chambers and valves are working. This procedure takes approximately one hour. There are no restrictions for this procedure.     Follow-Up: At [facility], you and your health needs are our priority.  As part of our continuing mission to provide you with exceptional heart care, we have created designated Provider Care Teams.  These Care Teams include your primary Cardiologist (physician) and Advanced Practice Providers (APPs -  Physician Assistants and Nurse Practitioners) who all work together to provide you with the care you need, when you need it.  We recommend signing up for the patient portal called "MyChart".  Sign up information is provided on this After Visit Summary.  MyChart is used to connect with patients for Virtual Visits (Telemedicine).  Patients are able to view lab/test results, encounter notes, upcoming appointments, etc.  Non-urgent messages can be sent to your provider as well.   To learn more about what you can do with MyChart, go to [email or web address].    Your next appointment:   1 month(s)  The format for your next appointment:   In Person  Provider:   Jyl Heinz, MD   Other  Instructions  Echocardiogram An echocardiogram is a test that uses sound waves (ultrasound) to produce images of the heart. Images from an echocardiogram can provide important information about:  Heart size and shape.  The size and thickness and movement of your heart's walls.  Heart muscle function and strength.  Heart valve function or if you have stenosis. Stenosis is when the heart valves are too narrow.  If blood is flowing backward through the heart valves (regurgitation).  A tumor or infectious growth around the heart valves.  Areas of heart muscle that are not working well because of poor blood flow or injury from a heart attack.  Aneurysm detection. An aneurysm is a weak or damaged part of an artery wall. The wall bulges out from the normal force of blood pumping through the body. Tell a health care provider about:  Any allergies you have.  All medicines you are taking, including vitamins, herbs, eye drops, creams, and over-the-counter medicines.  Any blood disorders you have.  Any surgeries you have had.  Any medical conditions you have.  Whether you are pregnant or may be pregnant. What are the risks? Generally, this is a safe test. However, problems may occur, including an allergic reaction to dye (contrast) that may be used during the test. What happens before the test? No specific preparation is needed. You may eat and drink normally. What happens during the test?  You will take off your clothes from the waist up and put on a hospital gown.  Electrodes or electrocardiogram (ECG)patches may be placed on your  chest. The electrodes or patches are then connected to a device that monitors your heart rate and rhythm.  You will lie down on a table for an ultrasound exam. A gel will be applied to your chest to help sound waves pass through your skin.  A handheld device, called a transducer, will be pressed against your chest and moved over your heart. The  transducer produces sound waves that travel to your heart and bounce back (or "echo" back) to the transducer. These sound waves will be captured in real-time and changed into images of your heart that can be viewed on a video monitor. The images will be recorded on a computer and reviewed by your health care provider.  You may be asked to change positions or hold your breath for a short time. This makes it easier to get different views or better views of your heart.  In some cases, you may receive contrast through an IV in one of your veins. This can improve the quality of the pictures from your heart. The procedure may vary among health care providers and hospitals.   What can I expect after the test? You may return to your normal, everyday life, including diet, activities, and medicines, unless your health care provider tells you not to do that. Follow these instructions at home:  It is up to you to get the results of your test. Ask your health care provider, or the department that is doing the test, when your results will be ready.  Keep all follow-up visits. This is important. Summary  An echocardiogram is a test that uses sound waves (ultrasound) to produce images of the heart.  Images from an echocardiogram can provide important information about the size and shape of your heart, heart muscle function, heart valve function, and other possible heart problems.  You do not need to do anything to prepare before this test. You may eat and drink normally.  After the echocardiogram is completed, you may return to your normal, everyday life, unless your health care provider tells you not to do that. This information is not intended to replace advice given to you by your health care provider. Make sure you discuss any questions you have with your health care provider. Document Revised: 06/24/2020 Document Reviewed: 06/24/2020 Elsevier Patient Education  2021 [employer].

## 2021-01-05 ENCOUNTER — Ambulatory Visit (HOSPITAL_BASED_OUTPATIENT_CLINIC_OR_DEPARTMENT_OTHER)
Admission: RE | Admit: 2021-01-05 | Discharge: 2021-01-05 | Disposition: A | Discharge status: Home / Self Care | Payer: Medicare Other | Source: Ambulatory Visit | Attending: Cardiology | Admitting: Cardiology

## 2021-01-05 ENCOUNTER — Other Ambulatory Visit: Payer: Self-pay

## 2021-01-05 DIAGNOSIS — Z954 Presence of other heart-valve replacement: Secondary | ICD-10-CM | POA: Insufficient documentation

## 2021-01-05 LAB — ECHOCARDIOGRAM COMPLETE
AR max vel: 1.26 cm2
AV Area VTI: 1.43 cm2
AV Area mean vel: 1.33 cm2
AV Mean grad: 16 mmHg
AV Peak grad: 30.5 mmHg
Ao pk vel: 2.76 m/s
Area-P 1/2: 1.89 cm2
MV M vel: 5.99 m/s
MV Peak grad: 143.5 mmHg
MV VTI: 1.25 cm2
S' Lateral: 2.66 cm

## 2021-04-14 DIAGNOSIS — I219 Acute myocardial infarction, unspecified: Secondary | ICD-10-CM | POA: Insufficient documentation

## 2021-04-14 DIAGNOSIS — E039 Hypothyroidism, unspecified: Secondary | ICD-10-CM | POA: Insufficient documentation

## 2021-04-14 DIAGNOSIS — N400 Enlarged prostate without lower urinary tract symptoms: Secondary | ICD-10-CM | POA: Insufficient documentation

## 2021-04-14 DIAGNOSIS — M109 Gout, unspecified: Secondary | ICD-10-CM | POA: Insufficient documentation

## 2021-04-15 ENCOUNTER — Other Ambulatory Visit: Payer: Self-pay

## 2021-04-15 ENCOUNTER — Encounter: Payer: Self-pay | Admitting: Cardiology

## 2021-04-15 ENCOUNTER — Ambulatory Visit (INDEPENDENT_AMBULATORY_CARE_PROVIDER_SITE_OTHER): Payer: Medicare Other | Admitting: Cardiology

## 2021-04-15 VITALS — BP 140/60 | HR 54 | Ht 68.0 in | Wt 216.0 lb

## 2021-04-15 DIAGNOSIS — I10 Essential (primary) hypertension: Secondary | ICD-10-CM | POA: Diagnosis not present

## 2021-04-15 DIAGNOSIS — Z954 Presence of other heart-valve replacement: Secondary | ICD-10-CM | POA: Diagnosis not present

## 2021-04-15 DIAGNOSIS — E782 Mixed hyperlipidemia: Secondary | ICD-10-CM | POA: Diagnosis not present

## 2021-04-15 DIAGNOSIS — I2510 Atherosclerotic heart disease of native coronary artery without angina pectoris: Secondary | ICD-10-CM | POA: Diagnosis not present

## 2021-04-15 DIAGNOSIS — E1140 Type 2 diabetes mellitus with diabetic neuropathy, unspecified: Secondary | ICD-10-CM

## 2021-04-15 NOTE — Patient Instructions (Signed)
Medication Instructions:  No medication changes. *If you need a refill on your cardiac medications before your next appointment, please call your pharmacy*   Lab Work: Your physician recommends that you have labs done in the office today. Your test included  basic metabolic panel, complete blood count, TSH, and liver function.  If you have labs (blood work) drawn today and your tests are completely normal, you will receive your results only by: Marland Kitchen MyChart Message (if you have MyChart) OR . A paper copy in the mail If you have any lab test that is abnormal or we need to change your treatment, we will call you to review the results.   Testing/Procedures: None ordered   Follow-Up: At [facility], you and your health needs are our priority.  As part of our continuing mission to provide you with exceptional heart care, we have created designated Provider Care Teams.  These Care Teams include your primary Cardiologist (physician) and Advanced Practice Providers (APPs -  Physician Assistants and Nurse Practitioners) who all work together to provide you with the care you need, when you need it.  We recommend signing up for the patient portal called "MyChart".  Sign up information is provided on this After Visit Summary.  MyChart is used to connect with patients for Virtual Visits (Telemedicine).  Patients are able to view lab/test results, encounter notes, upcoming appointments, etc.  Non-urgent messages can be sent to your provider as well.   To learn more about what you can do with MyChart, go to [email or web address].    Your next appointment:   6 month(s)  The format for your next appointment:   In Person  Provider:   Jyl Heinz, MD   Other Instructions NA

## 2021-04-15 NOTE — Progress Notes (Signed)
Cardiology Office Note:    Date:  04/15/2021   ID:  Peter Beltran, DOB 1925/11/11, MRN [id number]  PCP:  Javier Glazier, MD  Cardiologist:  Jenean Lindau, MD   Referring MD: Javier Glazier, MD    ASSESSMENT:    1. Atherosclerosis of native coronary artery of native heart without angina pectoris   2. Essential hypertension   3. Mixed hyperlipidemia   4. Status post aortic valve replacement with metallic valve    PLAN:    In order of problems listed above:  1. Coronary artery disease: Secondary prevention stressed with the patient.  Importance of compliance with diet medication stressed any vocalized understanding. 2. Essential hypertension: Blood pressure stable and diet was emphasized. 3. Mixed dyslipidemia: On statin therapy and tolerating well.  Lipids noted. 4. Aortic valve replacement with metallic valve: On anticoagulation with warfarin.  Tolerating well.  Followed by primary care. 5. Patient will have complete blood work today as he has not had blood work for some time. 6. Patient will be seen in follow-up appointment in 6 months or earlier if the patient has any concerns    Medication Adjustments/Labs and Tests Ordered: Current medicines are reviewed at length with the patient today.  Concerns regarding medicines are outlined above.  No orders of the defined types were placed in this encounter.  No orders of the defined types were placed in this encounter.    Chief Complaint  Patient presents with  . Follow-up     History of Present Illness:    Peter Beltran is a 87 y.o. male.  Patient has past medical history of aortic valve replacement by metallic St. Jude's valve, atherosclerotic coronary artery disease, essential hypertension and dyslipidemia.  He denies any problems at this time and takes care of activities of daily living.  No chest pain orthopnea or PND.  He lives in assisted living.  At the time of my evaluation, the patient is alert  awake oriented and in no distress.  Past Medical History:  Diagnosis Date  . Acute kidney failure, unspecified ([place]) 07/22/2019  . Acute maxillary sinusitis, unspecified 08/16/2017  . Allergic rhinitis, unspecified 02/04/2018  . Anxiety disorder, unspecified 09/17/2013  . Anxiety state 09/17/2013  . Aortic stenosis    post aortic valve replacement with St.Jude's valve by Dr.VanTrigt on 07-27-11  . Aortic valve disorder 05/29/2013  . Atherosclerotic heart disease of native coronary artery without angina pectoris 08/16/2017  . Benign prostatic hyperplasia with lower urinary tract symptoms 08/16/2017  . Candidal stomatitis 08/16/2017  . Cellulitis, unspecified 07/25/2019  . Chest pain 04/10/2016  . Chronic airway obstruction ([place]) 04/21/2013  . Chronic coronary artery disease 04/10/2016   Formatting of this note might be different from the original. Overview:  status post CABG x3 in 2008 Formatting of this note might be different from the original. Overview:  Overview:  status post CABG x3 in 2008  . Chronic rhinitis 12/27/2019   Last Assessment & Plan:  Concern over his nose. Chronic recurring nasal congestion.  Had a severe episode recently that responded to over-the-counter medication.  Currently asymptomatic.  Wants to make sure all is okay. EXAM intranasally shows no polyps, purulence, masses or obstructing anatomy.  Mucosa is overall healthy. PLAN: Reassured all looks okay.  I think what he is doing is fine.  Happy t  . Cognitive communication deficit 10/28/2020  . Constipation 08/16/2017  . Constipation, unspecified 08/16/2017  . Coronary artery disease    status post  CABG x3 in 2008  . Cough 10/20/2016  . Diabetic polyneuropathy associated with type 2 diabetes mellitus ([place]) 10/04/2016  . Disorder of the skin and subcutaneous tissue, unspecified 07/25/2019  . Disorders of muscle in diseases classified elsewhere, multiple sites 10/28/2020  . Disturbance of salivary secretion 04/21/2013  . Dry mouth,  unspecified 10/18/2018  . Dyslipidemia   . Dysphagia 04/21/2013  . Encounter for current long-term use of anticoagulants 05/29/2013   Formatting of this note might be different from the original. IMO routine update Formatting of this note might be different from the original. Overview:  IMO routine update  . Encounter for immunization 07/25/2019  . Enlarged prostate with lower urinary tract symptoms (LUTS) 05/29/2013  . Esophageal reflux 02/26/2014  . Essential hypertension 01/19/2019  . Follow-up visit for aortic valve replacement with metallic valve 07/20/7095  . Foreign body aspiration 03/05/2016  . Gastro-esophageal reflux disease without esophagitis 02/26/2014  . Generalized edema 10/18/2018  . Gout   . Gouty arthropathy 05/29/2013  . Hidrocystoma of left eyelid 12/10/2014  . History of falling 10/28/2020  . Hyperlipidemia, unspecified 11/11/2016  . Hyperplasia of prostate    benign  . Hyperplasia of prostate    benign  . Hypersomnia 06/14/2013  . Hypertension   . Hypertensive heart disease with heart failure ([place]) 10/18/2018  . Hypertriglyceridemia 11/11/2016  . Hypothyroidism   . Hypothyroidism, unspecified 04/10/2016  . Impacted cerumen, bilateral 08/16/2017  . Impaired fasting glucose 04/21/2013  . Iron deficiency anemia, unspecified 08/16/2017  . Ischemic optic neuropathy of both eyes 01/28/2016  . Localized edema 07/25/2019  . Long term (current) use of anticoagulants 05/29/2013   Overview:  IMO routine update IMO routine update  . Long term (current) use of aspirin 08/16/2017  . Low back pain 08/16/2017  . Low-tension glaucoma of both eyes, 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/2016   tmax 12/14 OCT 11/03/2016 VF 12/04/2015 Gonio 05/07/2015 tmax 12/14 OCT 11/03/2016 VF 12/04/2015 Gonio 05/07/2015  . Low-tension glaucoma, bilateral, 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/2016   tmax 12/14 OCT 11/03/2016 VF 12/04/2015 Gonio 05/07/2015 tmax 12/14 OCT 11/03/2016 VF 12/04/2015 Gonio 05/07/2015  . Lumbar stenosis 04/30/2015  . Memory  loss, short term 03/30/2016  . Mild cognitive impairment 08/30/2016  . Mixed dyslipidemia 01/19/2019  . Moderate persistent asthma without complication 2/83/6629  . Moderate persistent asthma, uncomplicated 4/76/5465  . Monitoring for long-term anticoagulant use 08/03/2017  . Muscle weakness (generalized) 10/28/2020  . Myocardial infarct ([place])   . Nasal congestion 02/04/2018  . Neuropathy 04/04/2014  . Obstructive sleep apnea 12/17/2013  . Obstructive sleep apnea of adult 12/17/2013  . Old myocardial infarction 08/22/2019  . Oral mucositis (ulcerative), unspecified 10/28/2020  . Osteoarthrosis 04/21/2013  . Other malaise 07/25/2019  . Other specified disorders of nose and nasal sinuses 10/28/2020  . Overweight 01/19/2019  . Pain in the chest   . Pain in thoracic spine 10/18/2018  . Periodic limb movements of sleep 08/26/2013  . Presence of aortocoronary bypass graft 10/18/2018  . Presence of prosthetic heart valve 10/18/2018  . Pseudophakia of both eyes 12/10/2014  . Psoriasis and similar disorder 01/28/2014  . Rash and other nonspecific skin eruption 08/16/2017  . 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/28/2013  . Restless legs syndrome 08/26/2013  . Spells 08/26/2013  . Spinal stenosis, lumbar region without neurogenic claudication 04/30/2015  . Status post aortic valve replacement with metallic valve 0/01/5464  . Status post shoulder joint replacement    right shoulder  . Subtherapeutic anticoagulation 04/10/2016  . Syncope and collapse  06/14/2013   Overview:  with seizure like activity. with seizure like activity.  . Traumatic subarachnoid hemorrhage without loss of consciousness, subsequent encounter 10/28/2020  . Type 2 diabetes mellitus with diabetic neuropathy, unspecified ([place]) 08/16/2017  . Unspecified abnormalities of gait and mobility 10/18/2018  . Unspecified atrial fibrillation ([place]) 03/30/2017  . Unspecified osteoarthritis, unspecified site 04/21/2013  . Unspecified urinary incontinence 05/17/2018  .  Urinary retention 07/18/2018  . Vitamin D deficiency, unspecified 08/16/2017    Past Surgical History:  Procedure Laterality Date  . AORTIC VALVE REPLACEMENT    . CORONARY ARTERY BYPASS GRAFT    . PROSTATE ABLATION    . TONSILLECTOMY      Current Medications: Current Meds  Medication Sig  . albuterol (PROVENTIL HFA;VENTOLIN HFA) 108 (90 Base) MCG/ACT inhaler Inhale 2 puffs into the lungs every 6 (six) hours as needed for wheezing or shortness of breath.  . allopurinol (ZYLOPRIM) 300 MG tablet Take 300 mg by mouth at bedtime.  Marland Kitchen antiseptic oral rinse (BIOTENE) LIQD 15 mLs by Mouth Rinse route 2 (two) times daily as needed for dry mouth.  . clonazePAM (KLONOPIN) 1 MG tablet Take 1 mg by mouth at bedtime as needed for anxiety.  Mariane Baumgarten Sodium (DSS) 100 MG CAPS Take 100 mg by mouth as needed for constipation.  . ergocalciferol (VITAMIN D2) 1.25 MG (50000 UT) capsule Take 50,000 Units by mouth once a week.  . fenofibrate (TRICOR) 145 MG tablet Take 145 mg by mouth daily.  . Fluticasone-Salmeterol (ADVAIR) 100-50 MCG/DOSE AEPB Inhale 2 puffs into the lungs 2 (two) times daily.  Marland Kitchen gabapentin (NEURONTIN) 100 MG capsule Take 100 mg by mouth daily.  Marland Kitchen gabapentin (NEURONTIN) 300 MG capsule Take 300 mg by mouth at bedtime.  Marland Kitchen GOODSENSE PAIN RELIEF 325 MG tablet Take 975 mg by mouth daily.  Marland Kitchen latanoprost (XALATAN) 0.005 % ophthalmic solution Place 1 drop into both eyes at bedtime.  Marland Kitchen levothyroxine (SYNTHROID) 112 MCG tablet Take 112 mcg by mouth once a week. 6 times a week  . levothyroxine (SYNTHROID) 200 MCG tablet Take 200 mcg by mouth every 7 (seven) days.  Marland Kitchen loratadine (CLARITIN) 10 MG tablet Take 1 tablet by mouth daily.  . magic mouthwash (nystatin, hydrocortisone, diphenhydrAMINE) suspension Swish and spit 5 mLs 4 (four) times daily as needed for mouth pain.  . metoprolol succinate (TOPROL-XL) 50 MG 24 hr tablet Take 50 mg by mouth 2 (two) times daily.  Marland Kitchen omega-3 acid ethyl esters (LOVAZA)  1 g capsule Take 1 capsule by mouth 2 (two) times daily.  Marland Kitchen omeprazole (PRILOSEC) 20 MG capsule Take 20 mg by mouth 2 (two) times daily before a meal.  . predniSONE (DELTASONE) 20 MG tablet Take 20 mg by mouth daily as needed (acute pain).  Marland Kitchen rOPINIRole (REQUIP) 2 MG tablet Take 2 mg by mouth at bedtime.  Marland Kitchen SLOW FE 142 (45 Fe) MG TBCR Take 45 mg by mouth daily.  . sodium chloride (OCEAN) 0.65 % SOLN nasal spray Place 1 spray into both nostrils as needed for congestion.  Marland Kitchen spironolactone (ALDACTONE) 25 MG tablet Take 25 mg by mouth daily.  . timolol (TIMOPTIC) 0.5 % ophthalmic solution Place 1 drop into both eyes 2 (two) times daily.  Marland Kitchen warfarin (COUMADIN) 3 MG tablet Take 3 mg by mouth 2 (two) times a week.  . warfarin (COUMADIN) 4 MG tablet Take 4 mg by mouth 4 (four) times a week. 5 times weekly  . [DISCONTINUED] Ferrous Sulfate 143 (45 Fe)  MG TBCR Take 143 mg by mouth 4 (four) times a week.     Allergies:   Atorvastatin calcium, Dorzolamide, Brimonidine, Brinzolamide-brimonidine, Lipitor [atorvastatin calcium], Rotigotine, Tamsulosin, Vancomycin, Zolpidem, and Dutasteride   Social History   Socioeconomic History  . Marital status: Married    Spouse name: Not on file  . Number of children: Not on file  . Years of education: Not on file  . Highest education level: Not on file  Occupational History  . Not on file  Tobacco Use  . Smoking status: Former Research scientist (life sciences)  . Smokeless tobacco: Never Used  Substance and Sexual Activity  . Alcohol use: No  . Drug use: No  . Sexual activity: Not on file  Other Topics Concern  . Not on file  Social History Narrative  . Not on file   Social Determinants of Health   Financial Resource Strain: Not on file  Food Insecurity: Not on file  Transportation Needs: Not on file  Physical Activity: Not on file  Stress: Not on file  Social Connections: Not on file     Family History: The patient's family history is negative for Hypertension,  Diabetes, Cancer, and Heart disease.  ROS:   Please see the history of present illness.    All other systems reviewed and are negative.  EKGs/Labs/Other Studies Reviewed:    The following studies were reviewed today: IMPRESSIONS    1. Left ventricular ejection fraction, by estimation, is 55 to 60%. The  left ventricle has normal function. The left ventricle has no regional  wall motion abnormalities. Left ventricular diastolic function could not  be evaluated.  2. Right ventricular systolic function is normal. The right ventricular  size is normal. There is normal pulmonary artery systolic pressure.  3. Left atrial size was moderately dilated.  4. The mitral valve is normal in structure. Moderate mitral valve  regurgitation. Mild mitral stenosis.  5. Satisfactory function of the aortic valve.. The aortic valve has been  repaired/replaced. Aortic valve regurgitation is not visualized. No aortic  stenosis is present. There is a St. Jude mechanical valve present in the  aortic position. Procedure Date:  2012.  6. The inferior vena cava is normal in size with greater than 50%  respiratory variability, suggesting right atrial pressure of 3 mmHg.    Recent Labs: No results found for requested labs within last 8760 hours.  Recent Lipid Panel    Component Value Date/Time   CHOL 187 04/11/2016 0300   TRIG 424 (H) 04/11/2016 0300   HDL 22 (L) 04/11/2016 0300   CHOLHDL 8.5 04/11/2016 0300   VLDL UNABLE TO CALCULATE IF TRIGLYCERIDE OVER 400 mg/dL 04/11/2016 0300   LDLCALC UNABLE TO CALCULATE IF TRIGLYCERIDE OVER 400 mg/dL 04/11/2016 0300    Physical Exam:    VS:  BP 140/60 (BP Location: Right Arm, Patient Position: Sitting, Cuff Size: Normal)   Pulse (!) 54   Ht 5\' 8"  (1.727 m)   Wt 216 lb (98 kg)   SpO2 92%   BMI 32.84 kg/m     Wt Readings from Last 3 Encounters:  04/15/21 216 lb (98 kg)  12/12/20 218 lb 1.9 oz (98.9 kg)  09/23/20 222 lb 0.6 oz (100.7 kg)      GEN: Patient is in no acute distress HEENT: Normal NECK: No JVD; No carotid bruits LYMPHATICS: No lymphadenopathy CARDIAC: Hear sounds regular, 2/6 systolic murmur at the apex. RESPIRATORY:  Clear to auscultation without rales, wheezing or rhonchi  ABDOMEN: Soft, non-tender,  non-distended MUSCULOSKELETAL:  No edema; No deformity  SKIN: Warm and dry NEUROLOGIC:  Alert and oriented x 3 PSYCHIATRIC:  Normal affect   Signed, Jenean Lindau, MD  04/15/2021 3:53 PM    [facility]

## 2021-04-16 LAB — BASIC METABOLIC PANEL
BUN/Creatinine Ratio: 27 — ABNORMAL HIGH (ref 10–24)
BUN: 23 mg/dL (ref 10–36)
CO2: 18 mmol/L — ABNORMAL LOW (ref 20–29)
Calcium: 9.9 mg/dL (ref 8.6–10.2)
Chloride: 101 mmol/L (ref 96–106)
Creatinine, Ser: 0.85 mg/dL (ref 0.76–1.27)
Glucose: 132 mg/dL — ABNORMAL HIGH (ref 65–99)
Potassium: 4.9 mmol/L (ref 3.5–5.2)
Sodium: 139 mmol/L (ref 134–144)
eGFR: 81 mL/min/{1.73_m2} (ref >59)

## 2021-04-16 LAB — HEPATIC FUNCTION PANEL
ALT: 30 IU/L (ref 0–44)
AST: 56 IU/L — ABNORMAL HIGH (ref 0–40)
Albumin: 4.3 g/dL (ref 3.5–4.6)
Alkaline Phosphatase: 63 IU/L (ref 44–121)
Bilirubin Total: 0.7 mg/dL (ref 0.0–1.2)
Bilirubin, Direct: 0.54 mg/dL — ABNORMAL HIGH (ref 0.00–0.40)
Total Protein: 7.5 g/dL (ref 6.0–8.5)

## 2021-04-16 LAB — CBC WITH DIFFERENTIAL/PLATELET
Basophils Absolute: 0 10*3/uL (ref 0.0–0.2)
Basos: 1 %
EOS (ABSOLUTE): 0.4 10*3/uL (ref 0.0–0.4)
Eos: 6 %
Hematocrit: 35.2 % — ABNORMAL LOW (ref 37.5–51.0)
Hemoglobin: 12.3 g/dL — ABNORMAL LOW (ref 13.0–17.7)
Immature Grans (Abs): 0 10*3/uL (ref 0.0–0.1)
Immature Granulocytes: 0 %
Lymphocytes Absolute: 2.4 10*3/uL (ref 0.7–3.1)
Lymphs: 39 %
MCH: 32.8 pg (ref 26.6–33.0)
MCHC: 34.9 g/dL (ref 31.5–35.7)
MCV: 94 fL (ref 79–97)
Monocytes Absolute: 0.7 10*3/uL (ref 0.1–0.9)
Monocytes: 11 %
Neutrophils Absolute: 2.6 10*3/uL (ref 1.4–7.0)
Neutrophils: 43 %
Platelets: 171 10*3/uL (ref 150–450)
RBC: 3.75 x10E6/uL — ABNORMAL LOW (ref 4.14–5.80)
RDW: 14.2 % (ref 11.6–15.4)
WBC: 6.1 10*3/uL (ref 3.4–10.8)

## 2021-04-16 LAB — LIPID PANEL
Chol/HDL Ratio: 12.7 ratio — ABNORMAL HIGH (ref 0.0–5.0)
Cholesterol, Total: 242 mg/dL — ABNORMAL HIGH (ref 100–199)
HDL: 19 mg/dL — ABNORMAL LOW (ref >39)
LDL Chol Calc (NIH): 91 mg/dL (ref 0–99)
Triglycerides: 782 mg/dL (ref 0–149)
VLDL Cholesterol Cal: 132 mg/dL — ABNORMAL HIGH (ref 5–40)

## 2021-04-16 LAB — TSH: TSH: 1.2 u[IU]/mL (ref 0.450–4.500)

## 2021-04-17 ENCOUNTER — Other Ambulatory Visit: Payer: Self-pay

## 2021-04-17 DIAGNOSIS — E782 Mixed hyperlipidemia: Secondary | ICD-10-CM

## 2021-04-17 MED ORDER — ICOSAPENT ETHYL 1 G PO CAPS: 2.0000 g | ORAL_CAPSULE | Freq: Two times a day (BID) | ORAL | 3 refills | Status: DC

## 2021-04-21 ENCOUNTER — Telehealth: Payer: Self-pay | Admitting: Cardiology

## 2021-04-21 MED ORDER — NITROGLYCERIN 0.4 MG SL SUBL: 0.4000 mg | SUBLINGUAL_TABLET | SUBLINGUAL | 6 refills | Status: DC | PRN

## 2021-04-21 NOTE — Telephone Encounter (Signed)
    Pt c/o of Chest Pain: STAT if CP now or developed within 24 hours  1. Are you having CP right now? No  2. Are you experiencing any other symptoms (ex. SOB, nausea, vomiting, sweating)? None  3. How long have you been experiencing CP? Last night  4. Is your CP continuous or coming and going?  coming and going  5. Have you taken Nitroglycerin? No  Denia RN with [facility], she said pt had CP last night and been on and off CP since he saw Dr. Geraldo Pitter last 06/01. She said pt doesn't have any other symptoms but they wanted to know if Dr. Geraldo Pitter would like to see pt again. She said to call the nursing station at [phone] and anyone on duty and speak with our nurse ?

## 2021-04-21 NOTE — Telephone Encounter (Signed)
EMS was called and assessed the pt. A prescription was sent in and staff advised to use nitroglycerin for chest pain and if unrelieved call 911.  Staff verbalized understanding and had no additional questions Pt was pain free at the time of call. Advised to call back for continued episodes of chest pain.

## 2021-04-23 ENCOUNTER — Telehealth: Payer: Self-pay | Admitting: Cardiology

## 2021-04-23 ENCOUNTER — Other Ambulatory Visit: Payer: Self-pay

## 2021-04-23 MED ORDER — ICOSAPENT ETHYL 1 G PO CAPS: 2.0000 g | ORAL_CAPSULE | Freq: Two times a day (BID) | ORAL | 3 refills | Status: DC

## 2021-04-23 NOTE — Telephone Encounter (Signed)
TERA FROM [facility] REACHING OUT FOR A COPY OF RX FOR MED VASCEPA (icosapent Ethyl) PLEASE FAX TO [phone] TO THE ATTENTION OF TERA.

## 2021-04-24 ENCOUNTER — Telehealth: Payer: Self-pay | Admitting: Cardiology

## 2021-04-24 NOTE — Telephone Encounter (Signed)
All labs and updated med list with last office note faxed to [facility].

## 2021-04-24 NOTE — Telephone Encounter (Signed)
RX faxed

## 2021-04-24 NOTE — Telephone Encounter (Signed)
Baxter Flattery from [employer] called. She would like orders for her to d/c the omega-3 acid ethyl esters (LOVAZA) 1 g capsule to start the icosapent Ethyl (VASCEPA) 1 g capsule. She also would like [place] to fax a copy of the patient's recent AVS to her. [place]. The phone number provided is to the Nurse's station at the facility

## 2021-04-29 ENCOUNTER — Emergency Department (HOSPITAL_COMMUNITY): Payer: Medicare Other

## 2021-04-29 ENCOUNTER — Other Ambulatory Visit: Payer: Self-pay

## 2021-04-29 ENCOUNTER — Observation Stay (HOSPITAL_COMMUNITY)
Admission: EM | Admit: 2021-04-29 | Discharge: 2021-04-30 | Disposition: A | Discharge status: Home / Self Care | Payer: Medicare Other | Attending: Student in an Organized Health Care Education/Training Program | Admitting: Student in an Organized Health Care Education/Training Program

## 2021-04-29 ENCOUNTER — Encounter (HOSPITAL_COMMUNITY): Payer: Self-pay

## 2021-04-29 DIAGNOSIS — E119 Type 2 diabetes mellitus without complications: Secondary | ICD-10-CM | POA: Insufficient documentation

## 2021-04-29 DIAGNOSIS — Z952 Presence of prosthetic heart valve: Secondary | ICD-10-CM | POA: Diagnosis not present

## 2021-04-29 DIAGNOSIS — Z87891 Personal history of nicotine dependence: Secondary | ICD-10-CM | POA: Insufficient documentation

## 2021-04-29 DIAGNOSIS — G2581 Restless legs syndrome: Secondary | ICD-10-CM | POA: Diagnosis not present

## 2021-04-29 DIAGNOSIS — I252 Old myocardial infarction: Secondary | ICD-10-CM | POA: Insufficient documentation

## 2021-04-29 DIAGNOSIS — R001 Bradycardia, unspecified: Secondary | ICD-10-CM | POA: Diagnosis present

## 2021-04-29 DIAGNOSIS — I110 Hypertensive heart disease with heart failure: Secondary | ICD-10-CM | POA: Insufficient documentation

## 2021-04-29 DIAGNOSIS — I2510 Atherosclerotic heart disease of native coronary artery without angina pectoris: Secondary | ICD-10-CM | POA: Diagnosis not present

## 2021-04-29 DIAGNOSIS — G4761 Periodic limb movement disorder: Secondary | ICD-10-CM | POA: Insufficient documentation

## 2021-04-29 DIAGNOSIS — Z951 Presence of aortocoronary bypass graft: Secondary | ICD-10-CM | POA: Insufficient documentation

## 2021-04-29 DIAGNOSIS — R4182 Altered mental status, unspecified: Secondary | ICD-10-CM | POA: Diagnosis not present

## 2021-04-29 DIAGNOSIS — G3184 Mild cognitive impairment, so stated: Secondary | ICD-10-CM | POA: Diagnosis present

## 2021-04-29 DIAGNOSIS — Z20822 Contact with and (suspected) exposure to covid-19: Secondary | ICD-10-CM | POA: Diagnosis not present

## 2021-04-29 DIAGNOSIS — E1142 Type 2 diabetes mellitus with diabetic polyneuropathy: Secondary | ICD-10-CM | POA: Diagnosis present

## 2021-04-29 DIAGNOSIS — Z79899 Other long term (current) drug therapy: Secondary | ICD-10-CM | POA: Diagnosis not present

## 2021-04-29 DIAGNOSIS — Z7901 Long term (current) use of anticoagulants: Secondary | ICD-10-CM | POA: Diagnosis not present

## 2021-04-29 DIAGNOSIS — W06XXXA Fall from bed, initial encounter: Secondary | ICD-10-CM | POA: Diagnosis not present

## 2021-04-29 DIAGNOSIS — J4540 Moderate persistent asthma, uncomplicated: Secondary | ICD-10-CM | POA: Diagnosis not present

## 2021-04-29 DIAGNOSIS — W19XXXA Unspecified fall, initial encounter: Secondary | ICD-10-CM | POA: Diagnosis present

## 2021-04-29 DIAGNOSIS — E039 Hypothyroidism, unspecified: Secondary | ICD-10-CM | POA: Insufficient documentation

## 2021-04-29 DIAGNOSIS — R5383 Other fatigue: Secondary | ICD-10-CM

## 2021-04-29 DIAGNOSIS — I509 Heart failure, unspecified: Secondary | ICD-10-CM | POA: Diagnosis not present

## 2021-04-29 HISTORY — DX: Bradycardia, unspecified: R00.1

## 2021-04-29 LAB — PROTIME-INR
INR: 2.5 — ABNORMAL HIGH (ref 0.8–1.2)
Prothrombin Time: 26.7 seconds — ABNORMAL HIGH (ref 11.4–15.2)

## 2021-04-29 LAB — FOLATE: Folate: 7.9 ng/mL (ref >5.9)

## 2021-04-29 LAB — CBC WITH DIFFERENTIAL/PLATELET
Abs Immature Granulocytes: 0.02 10*3/uL (ref 0.00–0.07)
Basophils Absolute: 0 10*3/uL (ref 0.0–0.1)
Basophils Relative: 0 %
Eosinophils Absolute: 0.3 10*3/uL (ref 0.0–0.5)
Eosinophils Relative: 5 %
HCT: 32.3 % — ABNORMAL LOW (ref 39.0–52.0)
Hemoglobin: 10.6 g/dL — ABNORMAL LOW (ref 13.0–17.0)
Immature Granulocytes: 0 %
Lymphocytes Relative: 39 %
Lymphs Abs: 2 10*3/uL (ref 0.7–4.0)
MCH: 32.5 pg (ref 26.0–34.0)
MCHC: 32.8 g/dL (ref 30.0–36.0)
MCV: 99.1 fL (ref 80.0–100.0)
Monocytes Absolute: 0.7 10*3/uL (ref 0.1–1.0)
Monocytes Relative: 12 %
Neutro Abs: 2.2 10*3/uL (ref 1.7–7.7)
Neutrophils Relative %: 44 %
Platelets: 149 10*3/uL — ABNORMAL LOW (ref 150–400)
RBC: 3.26 MIL/uL — ABNORMAL LOW (ref 4.22–5.81)
RDW: 15.2 % (ref 11.5–15.5)
WBC: 5.2 10*3/uL (ref 4.0–10.5)
nRBC: 0 % (ref 0.0–0.2)

## 2021-04-29 LAB — BASIC METABOLIC PANEL
Anion gap: 9 (ref 5–15)
BUN: 23 mg/dL (ref 8–23)
CO2: 24 mmol/L (ref 22–32)
Calcium: 9.4 mg/dL (ref 8.9–10.3)
Chloride: 100 mmol/L (ref 98–111)
Creatinine, Ser: 1.24 mg/dL (ref 0.61–1.24)
GFR, Estimated: 54 mL/min — ABNORMAL LOW (ref >60)
Glucose, Bld: 125 mg/dL — ABNORMAL HIGH (ref 70–99)
Potassium: 4.3 mmol/L (ref 3.5–5.1)
Sodium: 133 mmol/L — ABNORMAL LOW (ref 135–145)

## 2021-04-29 LAB — LACTATE DEHYDROGENASE: LDH: 185 U/L (ref 98–192)

## 2021-04-29 LAB — MAGNESIUM: Magnesium: 2 mg/dL (ref 1.7–2.4)

## 2021-04-29 LAB — TROPONIN I (HIGH SENSITIVITY)
Troponin I (High Sensitivity): 8 ng/L (ref <18)
Troponin I (High Sensitivity): 8 ng/L (ref <18)

## 2021-04-29 LAB — SARS CORONAVIRUS 2 (TAT 6-24 HRS): SARS Coronavirus 2: NEGATIVE

## 2021-04-29 LAB — VITAMIN B12: Vitamin B-12: 219 pg/mL (ref 180–914)

## 2021-04-29 LAB — TSH: TSH: 0.965 u[IU]/mL (ref 0.350–4.500)

## 2021-04-29 MED ORDER — ALLOPURINOL 300 MG PO TABS
300.0000 mg | ORAL_TABLET | Freq: Every day | ORAL | Status: DC
  Administered 2021-04-29 – 2021-04-30 (×2): 300 mg via ORAL
  Filled 2021-04-29 (×2): qty 1

## 2021-04-29 MED ORDER — ACETAMINOPHEN 325 MG PO TABS
650.0000 mg | ORAL_TABLET | Freq: Four times a day (QID) | ORAL | Status: DC | PRN
  Administered 2021-04-29: 650 mg via ORAL
  Filled 2021-04-29: qty 2

## 2021-04-29 MED ORDER — TIMOLOL MALEATE 0.5 % OP SOLN
1.0000 [drp] | Freq: Two times a day (BID) | OPHTHALMIC | Status: DC
  Administered 2021-04-29 – 2021-04-30 (×3): 1 [drp] via OPHTHALMIC
  Filled 2021-04-29: qty 5

## 2021-04-29 MED ORDER — ALBUTEROL SULFATE (2.5 MG/3ML) 0.083% IN NEBU: 2.5000 mg | INHALATION_SOLUTION | RESPIRATORY_TRACT | Status: DC | PRN

## 2021-04-29 MED ORDER — LEVOTHYROXINE SODIUM 100 MCG PO TABS: 200.0000 ug | ORAL_TABLET | ORAL | Status: DC

## 2021-04-29 MED ORDER — POLYETHYLENE GLYCOL 3350 17 G PO PACK: 17.0000 g | PACK | Freq: Every day | ORAL | Status: DC | PRN

## 2021-04-29 MED ORDER — FLUTICASONE FUROATE-VILANTEROL 100-25 MCG/INH IN AEPB
1.0000 | INHALATION_SPRAY | Freq: Every day | RESPIRATORY_TRACT | Status: DC
  Administered 2021-04-30: 1 via RESPIRATORY_TRACT
  Filled 2021-04-29: qty 28

## 2021-04-29 MED ORDER — WARFARIN SODIUM 4 MG PO TABS
4.0000 mg | ORAL_TABLET | Freq: Once | ORAL | Status: AC
  Administered 2021-04-29: 4 mg via ORAL
  Filled 2021-04-29: qty 1
  Filled 2021-04-29: qty 2

## 2021-04-29 MED ORDER — LACTATED RINGERS IV SOLN: INTRAVENOUS | Status: DC

## 2021-04-29 MED ORDER — LATANOPROST 0.005 % OP SOLN
1.0000 [drp] | Freq: Every day | OPHTHALMIC | Status: DC
  Administered 2021-04-29: 1 [drp] via OPHTHALMIC
  Filled 2021-04-29 (×2): qty 2.5

## 2021-04-29 MED ORDER — ACETAMINOPHEN 650 MG RE SUPP: 650.0000 mg | Freq: Four times a day (QID) | RECTAL | Status: DC | PRN

## 2021-04-29 MED ORDER — LEVOTHYROXINE SODIUM 112 MCG PO TABS: 112.0000 ug | ORAL_TABLET | ORAL | Status: DC

## 2021-04-29 MED ORDER — WARFARIN - PHARMACIST DOSING INPATIENT: Freq: Every day | Status: DC

## 2021-04-29 MED ORDER — LEVOTHYROXINE SODIUM 112 MCG PO TABS
112.0000 ug | ORAL_TABLET | ORAL | Status: DC
  Administered 2021-04-29 – 2021-04-30 (×2): 112 ug via ORAL
  Filled 2021-04-29 (×2): qty 1

## 2021-04-29 MED ORDER — SPIRONOLACTONE 25 MG PO TABS: 25.0000 mg | ORAL_TABLET | Freq: Every day | ORAL | Status: DC

## 2021-04-29 NOTE — H&P (Addendum)
Date: 04/29/2021               Patient Name:  Peter Beltran MRN: [id number]  DOB: Apr 23, 1925 Age / Sex: 87 y.o., male   PCP: Javier Glazier, MD         Medical Service: Internal Medicine Teaching Service         Attending Physician: Dr. Lucrezia Starch, MD    First Contact: Dr. Johnney Ou Pager: [phone]  Second Contact: Dr. Charleen Kirks Pager: [phone]       After Hours (After 5p/  First Contact Pager: [phone]  weekends / holidays): Second Contact Pager: [phone]   Chief Complaint: Fall  History of Present Illness:  Peter Beltran is a 87 yo M w/ PMH of AVR w/ metal stent, CAD, HTN, HLD, DM, OSA presenting to [facility] after being found down at his ALF. He was examined and evaluated at bedside in ED. He was noted to be very somnolent and unable to participate in history taking although he is AAOx2 to name and place and follow directions.  Spoke with Ms.Peter Beltran, his spouse, regarding events yesterday. She mentions that after dinner, he was acting 'bizarre' like 'Peter Beltran' and asking questions that did not make sense. She denies any history of dementia or prior episodes of confusion. She denies noticing any unusal inciting event. She denies any medication or dietary changes. She denies any trauma, fevers, cough, diarrhea.  Additional history obtained by speaking with nursing staff at [facility]. She mentions that his wife found him on the floor next to bed unresponsive last night. EMS was called and he was found to be somnolent but able to be aroused with tactile stimuli. She mentions yesterday he had endorse episode of chest pain which resolved with nitroglycerin around breakfast yesterday. She also noted worsening confusion yesterday evening which is unusual for him. She mentions only recent medication change being replacement of Lovaza with Vescepa for his HLD. She also confirmed listed dosing for levothyroxine and scheduled clonazepam.  Meds:  Current Meds   Medication Sig   acetaminophen (TYLENOL) 650 MG CR tablet Take 650 mg by mouth in the morning and at bedtime.   albuterol (PROVENTIL HFA;VENTOLIN HFA) 108 (90 Base) MCG/ACT inhaler Inhale 2 puffs into the lungs every 6 (six) hours as needed for wheezing or shortness of breath.   allopurinol (ZYLOPRIM) 300 MG tablet Take 300 mg by mouth daily.   antiseptic oral rinse (BIOTENE) LIQD 30 mLs by Mouth Rinse route in the morning and at bedtime.   clonazePAM (KLONOPIN) 1 MG tablet Take 1 mg by mouth at bedtime.   Docusate Sodium (DSS) 100 MG CAPS Take 100 mg by mouth as needed for constipation.   ergocalciferol (VITAMIN D2) 1.25 MG (50000 UT) capsule Take 50,000 Units by mouth every Tuesday.   fenofibrate (TRICOR) 145 MG tablet Take 145 mg by mouth daily.   Fluticasone-Salmeterol (ADVAIR) 100-50 MCG/DOSE AEPB Inhale 2 puffs into the lungs 2 (two) times daily.   gabapentin (NEURONTIN) 100 MG capsule Take 100 mg by mouth daily.   gabapentin (NEURONTIN) 300 MG capsule Take 300 mg by mouth at bedtime.   icosapent Ethyl (VASCEPA) 1 g capsule Take 2 capsules (2 g total) by mouth 2 (two) times daily.   latanoprost (XALATAN) 0.005 % ophthalmic solution Place 1 drop into both eyes at bedtime.   levothyroxine (SYNTHROID) 112 MCG tablet Take 112 mcg by mouth once a week. 6 times a week   levothyroxine (SYNTHROID) 200  MCG tablet Take 200 mcg by mouth every 7 (seven) days.   loratadine (CLARITIN) 10 MG tablet Take 1 tablet by mouth daily.   magic mouthwash (nystatin, hydrocortisone, diphenhydrAMINE) suspension Swish and spit 5 mLs 4 (four) times daily as needed for mouth pain.   metoprolol succinate (TOPROL-XL) 50 MG 24 hr tablet Take 50 mg by mouth 2 (two) times daily.   nitroGLYCERIN (NITROSTAT) 0.4 MG SL tablet Place 1 tablet (0.4 mg total) under the tongue every 5 (five) minutes as needed for chest pain.   omeprazole (PRILOSEC) 20 MG capsule Take 20 mg by mouth 2 (two) times daily before a meal.   predniSONE  (DELTASONE) 20 MG tablet Take 20 mg by mouth daily as needed (acute pain).   rOPINIRole (REQUIP) 0.5 MG tablet Take 0.5 mg by mouth in the morning.   rOPINIRole (REQUIP) 2 MG tablet Take 2 mg by mouth at bedtime.   SLOW FE 142 (45 Fe) MG TBCR Take 45 mg by mouth daily.   sodium chloride (OCEAN) 0.65 % SOLN nasal spray Place 1 spray into both nostrils as needed for congestion.   spironolactone (ALDACTONE) 25 MG tablet Take 25 mg by mouth daily.   timolol (TIMOPTIC) 0.5 % ophthalmic solution Place 1 drop into both eyes 2 (two) times daily.   warfarin (COUMADIN) 3 MG tablet Take 3 mg by mouth 2 (two) times a week.   warfarin (COUMADIN) 4 MG tablet Take 4 mg by mouth 4 (four) times a week. 5 times weekly   Allergies: Allergies as of 04/29/2021 - Review Complete 04/29/2021  Allergen Reaction Noted   Atorvastatin calcium Other (See Comments) 08/24/2011   Dorzolamide Other (See Comments) 05/06/2017   Brimonidine Other (See Comments) 04/10/2016   Brinzolamide-brimonidine  04/10/2016   Lipitor [atorvastatin calcium]  08/24/2011   Rotigotine Other (See Comments) 04/10/2016   Tamsulosin Other (See Comments) 04/10/2016   Vancomycin  08/24/2011   Zolpidem Other (See Comments) 04/10/2016   Dutasteride Rash 04/10/2016   Past Medical History:  Diagnosis Date   Acute kidney failure, unspecified ([place]) 07/22/2019   Acute maxillary sinusitis, unspecified 08/16/2017   Allergic rhinitis, unspecified 02/04/2018   Anxiety disorder, unspecified 09/17/2013   Anxiety state 09/17/2013   Aortic stenosis    post aortic valve replacement with [employer]'s valve by Dr.VanTrigt on 07-27-11   Aortic valve disorder 05/29/2013   Atherosclerotic heart disease of native coronary artery without angina pectoris 08/16/2017   Benign prostatic hyperplasia with lower urinary tract symptoms 08/16/2017   Candidal stomatitis 08/16/2017   Cellulitis, unspecified 07/25/2019   Chest pain 04/10/2016   Chronic airway obstruction ([place]) 04/21/2013    Chronic coronary artery disease 04/10/2016   Formatting of this note might be different from the original. Overview:  status post CABG x3 in 2008 Formatting of this note might be different from the original. Overview:  Overview:  status post CABG x3 in 2008   Chronic rhinitis 12/27/2019   Last Assessment & Plan:  Concern over his nose. Chronic recurring nasal congestion.  Had a severe episode recently that responded to over-the-counter medication.  Currently asymptomatic.  Wants to make sure all is okay. EXAM intranasally shows no polyps, purulence, masses or obstructing anatomy.  Mucosa is overall healthy. PLAN: Reassured all looks okay.  I think what he is doing is fine.  Happy t   Cognitive communication deficit 10/28/2020   Constipation 08/16/2017   Constipation, unspecified 08/16/2017   Coronary artery disease    status post CABG x3 in  2008   Cough 10/20/2016   Diabetic polyneuropathy associated with type 2 diabetes mellitus ([place]) 10/04/2016   Disorder of the skin and subcutaneous tissue, unspecified 07/25/2019   Disorders of muscle in diseases classified elsewhere, multiple sites 10/28/2020   Disturbance of salivary secretion 04/21/2013   Dry mouth, unspecified 10/18/2018   Dyslipidemia    Dysphagia 04/21/2013   Encounter for current long-term use of anticoagulants 05/29/2013   Formatting of this note might be different from the original. IMO routine update Formatting of this note might be different from the original. Overview:  IMO routine update   Encounter for immunization 07/25/2019   Enlarged prostate with lower urinary tract symptoms (LUTS) 05/29/2013   Esophageal reflux 02/26/2014   Essential hypertension 01/19/2019   Follow-up visit for aortic valve replacement with metallic valve 04/17/1496   Foreign body aspiration 03/05/2016   Gastro-esophageal reflux disease without esophagitis 02/26/2014   Generalized edema 10/18/2018   Gout    Gouty arthropathy 05/29/2013   Hidrocystoma of left eyelid  12/10/2014   History of falling 10/28/2020   Hyperlipidemia, unspecified 11/11/2016   Hyperplasia of prostate    benign   Hyperplasia of prostate    benign   Hypersomnia 06/14/2013   Hypertension    Hypertensive heart disease with heart failure ([place]) 10/18/2018   Hypertriglyceridemia 11/11/2016   Hypothyroidism    Hypothyroidism, unspecified 04/10/2016   Impacted cerumen, bilateral 08/16/2017   Impaired fasting glucose 04/21/2013   Iron deficiency anemia, unspecified 08/16/2017   Ischemic optic neuropathy of both eyes 01/28/2016   Localized edema 07/25/2019   Long term (current) use of anticoagulants 05/29/2013   Overview:  IMO routine update IMO routine update   Long term (current) use of aspirin 08/16/2017   Low back pain 08/16/2017   Low-tension glaucoma of both eyes, 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/2016   tmax 12/14 OCT 11/03/2016 VF 12/04/2015 Gonio 05/07/2015 tmax 12/14 OCT 11/03/2016 VF 12/04/2015 Gonio 05/07/2015   Low-tension glaucoma, bilateral, 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/2016   tmax 12/14 OCT 11/03/2016 VF 12/04/2015 Gonio 05/07/2015 tmax 12/14 OCT 11/03/2016 VF 12/04/2015 Gonio 05/07/2015   Lumbar stenosis 04/30/2015   Memory loss, short term 03/30/2016   Mild cognitive impairment 08/30/2016   Mixed dyslipidemia 01/19/2019   Moderate persistent asthma without complication 0/26/3785   Moderate persistent asthma, uncomplicated 8/85/0277   Monitoring for long-term anticoagulant use 08/03/2017   Muscle weakness (generalized) 10/28/2020   Myocardial infarct ([place])    Nasal congestion 02/04/2018   Neuropathy 04/04/2014   Obstructive sleep apnea 12/17/2013   Obstructive sleep apnea of adult 12/17/2013   Old myocardial infarction 08/22/2019   Oral mucositis (ulcerative), unspecified 10/28/2020   Osteoarthrosis 04/21/2013   Other malaise 07/25/2019   Other specified disorders of nose and nasal sinuses 10/28/2020   Overweight 01/19/2019   Pain in the chest    Pain in thoracic spine 10/18/2018   Periodic limb movements  of sleep 08/26/2013   Presence of aortocoronary bypass graft 10/18/2018   Presence of prosthetic heart valve 10/18/2018   Pseudophakia of both eyes 12/10/2014   Psoriasis and similar disorder 01/28/2014   Rash and other nonspecific skin eruption 08/16/2017   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/28/2013   Restless legs syndrome 08/26/2013   Spells 08/26/2013   Spinal stenosis, lumbar region without neurogenic claudication 04/30/2015   Status post aortic valve replacement with metallic valve 02/13/2877   Status post shoulder joint replacement    right shoulder   Subtherapeutic anticoagulation 04/10/2016   Syncope and collapse 06/14/2013  Overview:  with seizure like activity. with seizure like activity.   Traumatic subarachnoid hemorrhage without loss of consciousness, subsequent encounter 10/28/2020   Type 2 diabetes mellitus with diabetic neuropathy, unspecified ([place]) 08/16/2017   Unspecified abnormalities of gait and mobility 10/18/2018   Unspecified atrial fibrillation ([place]) 03/30/2017   Unspecified osteoarthritis, unspecified site 04/21/2013   Unspecified urinary incontinence 05/17/2018   Urinary retention 07/18/2018   Vitamin D deficiency, unspecified 08/16/2017   Family History:  Unable to provide  Social History: Lives at assisted living facility, [facility] with wife. Son lives nearby. Able to perform ADLs independently. Receive help from children with IADLs. Occasional wine use. Denies illicit substance use. Distant history of tobacco use.  Review of Systems: Unable to provide review of systems.  Physical Exam: Blood pressure 140/60, pulse (!) 40, temperature 98.2 F (36.8 C), temperature source Oral, resp. rate 13, SpO2 94 %.  Gen: Well-developed, well nourished, somnolent HEENT: NCAT head, hearing intact, EOMI, dry mucous membranes Neck: supple, ROM intact, no JVD CV: Regular, bradycardic, normal S1, pronounced S2. Pulm: CTAB, No rales, no wheezes, no dullness to percussion  Abd: Soft,  BS+, NTND Extm: ROM intact, Peripheral pulses intact, Trace ankle edema Skin: Dry, Warm, healed surgical scars on chest Neuro: GCS 13, Unable to participate fully in neuro exam but able to move all extremities, able to resist against gravity bilateral upper and lower extremities  EKG: personally reviewed my interpretation is sinus bradycardia, no ischemic changes, no AV block, + RBBB  CXR: personally reviewed my interpretation is wide mediastinum, + sternotomy wires  Assessment & Plan by Problem: Active Problems:   * No active hospital problems. *  Peter Beltran is a 87 yo M w/ PMH of AVR w/ metal stent, CAD, HTN, HLD, DM, presenting to [facility] after fall, found to endorse symptomatic bradycardia  Fall/Encephalopathy Found down at home. Unclear etiology. Wife mentions episode of confusion after dinner yesterday. Only new medication is vascepa. Chart review shows hx of prior falls. On scheduled benzos. Also unusual levothyroxine dosing. Continued somnolence this am. On admission noted to be bradycardic, but bp stable. HsTrop negative. No ischemic changes on EKG. Will admit for observation and further work-up - F/u UA - TSH, B12 - Telemetry - Hold home central acting meds - Delirium precautions - PT/OT eval  Sinus Bradycardia Noted to be bradycardic on admission. HR 40s. Prior office visits show HR 50-60s. BP stable at 140/60. Cardiology requesting admission for further evaluation - Appreciate cardiology recs - Telemetry - Hold home metoprolol succinate  Hypothyroidism On levothyroxine 185mcg M-Sa, 21mcg on Sunday. Unusual dosing. No recent TSH on chart review - Check TSH - C/w levothyroxine at 149mcg daily for now  AVR w/ Metal stent Coronary Artery Disease On anticoagulation w/ warfarin. Anti-platelet therapy stopped on prior admission for fall with subarachnoid hemorrhage. Echo 01/05/21 with intact valve function. - Warfarin per pharmacy - INR monitoring - Monitor for  bleed  Normocytic Anemia Admit hgb 10.6. Prior hgb 12.3. MCV 99.1. RDW 15.2. Has hx of mechanical valve which may cause some hemolysis but MCV borderline macrocytic - Check B12, folate, ldh - Trend cbc - Monitor for bleed  HTN On spironolactone 25mg  daily. Admit bp 140/60 - Monitor - Can resume if able to maintain bp with current HR  Restless Leg Syndrome On ropinrole 2mg  qhs, gabapentin 300mg  qhs, clonazepam 1mg  qhs - Holding central acting meds  DVT prophx: Coumadin Diet: HH Bowel: Miralax Code: DNR  Prior to Admission Living Arrangement: ALF  Anticipated Discharge Location: ALF Barriers to Discharge: Medical work-up  Dispo: Admit patient to Observation with expected length of stay less than 2 midnights.  Signed: Mosetta Anis, MD 04/29/2021, 7:14 AM Pager: [phone] After 5pm on weekdays and 1pm on weekends: On Call Pager: [phone]

## 2021-04-29 NOTE — ED Notes (Signed)
This RN provided update for pt daughter Rahshawn Remo

## 2021-04-29 NOTE — ED Provider Notes (Signed)
[facility] EMERGENCY DEPARTMENT Provider Note   CSN: [id number] Arrival date & time: 04/29/21  0345     History Chief Complaint  Patient presents with   Fatigue   Bradycardia    Peter Beltran is a 87 y.o. male.  Presents to ER from facility with concern for bradycardia, lethargy.  Patient was found sleeping on the floor next to the bed.  Was reportedly difficult to arouse though when aroused he was alert and appropriate.  Noted to have heart rate in the 40s.  Sent to ER for further eval.  Patient thinks that he may have rolled out of bed, does not recall any specific trauma.  He denies any acute pain at present.  Specifically denies any neck, back, chest or abdominal pain.  HPI     Past Medical History:  Diagnosis Date   Acute kidney failure, unspecified ([place]) 07/22/2019   Acute maxillary sinusitis, unspecified 08/16/2017   Allergic rhinitis, unspecified 02/04/2018   Anxiety disorder, unspecified 09/17/2013   Anxiety state 09/17/2013   Aortic stenosis    post aortic valve replacement with [employer].Jude's valve by Dr.VanTrigt on 07-27-11   Aortic valve disorder 05/29/2013   Atherosclerotic heart disease of native coronary artery without angina pectoris 08/16/2017   Benign prostatic hyperplasia with lower urinary tract symptoms 08/16/2017   Candidal stomatitis 08/16/2017   Cellulitis, unspecified 07/25/2019   Chest pain 04/10/2016   Chronic airway obstruction ([place]) 04/21/2013   Chronic coronary artery disease 04/10/2016   Formatting of this note might be different from the original. Overview:  status post CABG x3 in 2008 Formatting of this note might be different from the original. Overview:  Overview:  status post CABG x3 in 2008   Chronic rhinitis 12/27/2019   Last Assessment & Plan:  Concern over his nose. Chronic recurring nasal congestion.  Had a severe episode recently that responded to over-the-counter medication.  Currently asymptomatic.  Wants to make sure all is  okay. EXAM intranasally shows no polyps, purulence, masses or obstructing anatomy.  Mucosa is overall healthy. PLAN: Reassured all looks okay.  I think what he is doing is fine.  Happy t   Cognitive communication deficit 10/28/2020   Constipation 08/16/2017   Constipation, unspecified 08/16/2017   Coronary artery disease    status post CABG x3 in 2008   Cough 10/20/2016   Diabetic polyneuropathy associated with type 2 diabetes mellitus ([place]) 10/04/2016   Disorder of the skin and subcutaneous tissue, unspecified 07/25/2019   Disorders of muscle in diseases classified elsewhere, multiple sites 10/28/2020   Disturbance of salivary secretion 04/21/2013   Dry mouth, unspecified 10/18/2018   Dyslipidemia    Dysphagia 04/21/2013   Encounter for current long-term use of anticoagulants 05/29/2013   Formatting of this note might be different from the original. IMO routine update Formatting of this note might be different from the original. Overview:  IMO routine update   Encounter for immunization 07/25/2019   Enlarged prostate with lower urinary tract symptoms (LUTS) 05/29/2013   Esophageal reflux 02/26/2014   Essential hypertension 01/19/2019   Follow-up visit for aortic valve replacement with metallic valve 03/23/1637   Foreign body aspiration 03/05/2016   Gastro-esophageal reflux disease without esophagitis 02/26/2014   Generalized edema 10/18/2018   Gout    Gouty arthropathy 05/29/2013   Hidrocystoma of left eyelid 12/10/2014   History of falling 10/28/2020   Hyperlipidemia, unspecified 11/11/2016   Hyperplasia of prostate    benign   Hyperplasia of prostate  benign   Hypersomnia 06/14/2013   Hypertension    Hypertensive heart disease with heart failure ([place]) 10/18/2018   Hypertriglyceridemia 11/11/2016   Hypothyroidism    Hypothyroidism, unspecified 04/10/2016   Impacted cerumen, bilateral 08/16/2017   Impaired fasting glucose 04/21/2013   Iron deficiency anemia, unspecified 08/16/2017   Ischemic optic  neuropathy of both eyes 01/28/2016   Localized edema 07/25/2019   Long term (current) use of anticoagulants 05/29/2013   Overview:  IMO routine update IMO routine update   Long term (current) use of aspirin 08/16/2017   Low back pain 08/16/2017   Low-tension glaucoma of both eyes, 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/2016   tmax 12/14 OCT 11/03/2016 VF 12/04/2015 Gonio 05/07/2015 tmax 12/14 OCT 11/03/2016 VF 12/04/2015 Gonio 05/07/2015   Low-tension glaucoma, bilateral, 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/2016   tmax 12/14 OCT 11/03/2016 VF 12/04/2015 Gonio 05/07/2015 tmax 12/14 OCT 11/03/2016 VF 12/04/2015 Gonio 05/07/2015   Lumbar stenosis 04/30/2015   Memory loss, short term 03/30/2016   Mild cognitive impairment 08/30/2016   Mixed dyslipidemia 01/19/2019   Moderate persistent asthma without complication 03/17/5464   Moderate persistent asthma, uncomplicated 6/81/2751   Monitoring for long-term anticoagulant use 08/03/2017   Muscle weakness (generalized) 10/28/2020   Myocardial infarct ([place])    Nasal congestion 02/04/2018   Neuropathy 04/04/2014   Obstructive sleep apnea 12/17/2013   Obstructive sleep apnea of adult 12/17/2013   Old myocardial infarction 08/22/2019   Oral mucositis (ulcerative), unspecified 10/28/2020   Osteoarthrosis 04/21/2013   Other malaise 07/25/2019   Other specified disorders of nose and nasal sinuses 10/28/2020   Overweight 01/19/2019   Pain in the chest    Pain in thoracic spine 10/18/2018   Periodic limb movements of sleep 08/26/2013   Presence of aortocoronary bypass graft 10/18/2018   Presence of prosthetic heart valve 10/18/2018   Pseudophakia of both eyes 12/10/2014   Psoriasis and similar disorder 01/28/2014   Rash and other nonspecific skin eruption 08/16/2017   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/28/2013   Restless legs syndrome 08/26/2013   Spells 08/26/2013   Spinal stenosis, lumbar region without neurogenic claudication 04/30/2015   Status post aortic valve replacement with metallic valve 7/0/0174    Status post shoulder joint replacement    right shoulder   Subtherapeutic anticoagulation 04/10/2016   Syncope and collapse 06/14/2013   Overview:  with seizure like activity. with seizure like activity.   Traumatic subarachnoid hemorrhage without loss of consciousness, subsequent encounter 10/28/2020   Type 2 diabetes mellitus with diabetic neuropathy, unspecified ([place]) 08/16/2017   Unspecified abnormalities of gait and mobility 10/18/2018   Unspecified atrial fibrillation ([place]) 03/30/2017   Unspecified osteoarthritis, unspecified site 04/21/2013   Unspecified urinary incontinence 05/17/2018   Urinary retention 07/18/2018   Vitamin D deficiency, unspecified 08/16/2017    Patient Active Problem List   Diagnosis Date Noted   Gout    Hyperplasia of prostate    Hypothyroidism    Myocardial infarct [facility])    Cognitive communication deficit 10/28/2020   Disorders of muscle in diseases classified elsewhere, multiple sites 10/28/2020   History of falling 10/28/2020   Muscle weakness (generalized) 10/28/2020   Oral mucositis (ulcerative), unspecified 10/28/2020   Other specified disorders of nose and nasal sinuses 10/28/2020   Traumatic subarachnoid hemorrhage without loss of consciousness, subsequent encounter 10/28/2020   Aortic stenosis    Hypertension    Status post shoulder joint replacement    Chronic rhinitis 12/27/2019   Old myocardial infarction 08/22/2019   Other malaise 07/25/2019   Cellulitis,  unspecified 07/25/2019   Disorder of the skin and subcutaneous tissue, unspecified 07/25/2019   Encounter for immunization 07/25/2019   Localized edema 07/25/2019   Acute kidney failure, unspecified ([place]) 07/22/2019   Status post aortic valve replacement with metallic valve 44/01/4741   Essential hypertension 01/19/2019   Mixed dyslipidemia 01/19/2019   Follow-up visit for aortic valve replacement with metallic valve 59/56/3875   Overweight 01/19/2019   Unspecified abnormalities of gait and  mobility 10/18/2018   Presence of prosthetic heart valve 10/18/2018   Presence of aortocoronary bypass graft 10/18/2018   Pain in thoracic spine 10/18/2018   Generalized edema 10/18/2018   Hypertensive heart disease with heart failure ([place]) 10/18/2018   Dry mouth, unspecified 10/18/2018   Urinary retention 07/18/2018   Unspecified urinary incontinence 05/17/2018   Nasal congestion 02/04/2018   Allergic rhinitis, unspecified 02/04/2018   Vitamin D deficiency, unspecified 08/16/2017   Type 2 diabetes mellitus with diabetic neuropathy, unspecified ([place]) 08/16/2017   Rash and other nonspecific skin eruption 08/16/2017   Low back pain 08/16/2017   Long term (current) use of aspirin 08/16/2017   Iron deficiency anemia, unspecified 08/16/2017   Impacted cerumen, bilateral 08/16/2017   Acute maxillary sinusitis, unspecified 08/16/2017   Candidal stomatitis 08/16/2017   Constipation, unspecified 08/16/2017   Atherosclerotic heart disease of native coronary artery without angina pectoris 08/16/2017   Benign prostatic hyperplasia with lower urinary tract symptoms 08/16/2017   Constipation 08/16/2017   Monitoring for long-term anticoagulant use 08/03/2017   Unspecified atrial fibrillation ([place]) 03/30/2017   Hyperlipidemia, unspecified 11/11/2016   Hypertriglyceridemia 11/11/2016   Cough 10/20/2016   Diabetic polyneuropathy associated with type 2 diabetes mellitus ([place]) 10/04/2016   Mild cognitive impairment 08/30/2016   Pain in the chest    Chest pain 04/10/2016   Coronary artery disease 04/10/2016   Hypothyroidism, unspecified 04/10/2016   Dyslipidemia 04/10/2016   Subtherapeutic anticoagulation 04/10/2016   Chronic coronary artery disease 04/10/2016   Memory loss, short term 03/30/2016   Foreign body aspiration 03/05/2016   Moderate persistent asthma, uncomplicated 64/33/2951   Moderate persistent asthma without complication 88/41/6606   Ischemic optic neuropathy of both eyes  01/28/2016   Low-tension glaucoma, bilateral, 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/2016   Low-tension glaucoma of both eyes, 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/2016   Spinal stenosis, lumbar region without neurogenic claudication 04/30/2015   Lumbar stenosis 04/30/2015   Hidrocystoma of left eyelid 12/10/2014   Pseudophakia of both eyes 12/10/2014   Neuropathy 04/04/2014   Gastro-esophageal reflux disease without esophagitis 02/26/2014   Esophageal reflux 02/26/2014   Psoriasis and similar disorder 01/28/2014   Obstructive sleep apnea 12/17/2013   Obstructive sleep apnea of adult 12/17/2013   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/28/2013   Anxiety disorder, unspecified 09/17/2013   Anxiety state 09/17/2013   Restless legs syndrome 08/26/2013   Spells 08/26/2013   Periodic limb movements of sleep 08/26/2013   Hypersomnia 06/14/2013   Syncope and collapse 06/14/2013   Aortic valve disorder 05/29/2013   Enlarged prostate with lower urinary tract symptoms (LUTS) 05/29/2013   Gouty arthropathy 05/29/2013   Long term (current) use of anticoagulants 05/29/2013   Encounter for current long-term use of anticoagulants 05/29/2013   Chronic airway obstruction ([place]) 04/21/2013   Disturbance of salivary secretion 04/21/2013   Dysphagia 04/21/2013   Impaired fasting glucose 04/21/2013   Unspecified osteoarthritis, unspecified site 04/21/2013   Osteoarthrosis 04/21/2013    Past Surgical History:  Procedure Laterality Date   AORTIC VALVE REPLACEMENT     CORONARY ARTERY BYPASS GRAFT  PROSTATE ABLATION     TONSILLECTOMY         Family History  Problem Relation Age of Onset   Hypertension Neg Hx    Diabetes Neg Hx    Cancer Neg Hx    Heart disease Neg Hx     Social History   Tobacco Use   Smoking status: Former    Pack years: 0.00   Smokeless tobacco: Never  Substance Use Topics   Alcohol use: No   Drug use: No    Home Medications Prior to Admission medications   Medication Sig Start Date End Date Taking?  Authorizing Provider  acetaminophen (TYLENOL) 650 MG CR tablet Take 650 mg by mouth in the morning and at bedtime.   Yes [provider]  albuterol (PROVENTIL HFA;VENTOLIN HFA) 108 (90 Base) MCG/ACT inhaler Inhale 2 puffs into the lungs every 6 (six) hours as needed for wheezing or shortness of breath.   Yes [provider]  allopurinol (ZYLOPRIM) 300 MG tablet Take 300 mg by mouth daily.   Yes [provider]  antiseptic oral rinse (BIOTENE) LIQD 30 mLs by Mouth Rinse route in the morning and at bedtime.   Yes [provider]  clonazePAM (KLONOPIN) 1 MG tablet Take 1 mg by mouth at bedtime. 01/02/20  Yes [provider]  Docusate Sodium (DSS) 100 MG CAPS Take 100 mg by mouth as needed for constipation.   Yes [provider]  ergocalciferol (VITAMIN D2) 1.25 MG (50000 UT) capsule Take 50,000 Units by mouth every Tuesday.   Yes [provider]  fenofibrate (TRICOR) 145 MG tablet Take 145 mg by mouth daily.   Yes [provider]  Fluticasone-Salmeterol (ADVAIR) 100-50 MCG/DOSE AEPB Inhale 2 puffs into the lungs 2 (two) times daily.   Yes [provider]  gabapentin (NEURONTIN) 100 MG capsule Take 100 mg by mouth daily.   Yes [provider]  gabapentin (NEURONTIN) 300 MG capsule Take 300 mg by mouth at bedtime. 11/29/19  Yes [provider]  icosapent Ethyl (VASCEPA) 1 g capsule Take 2 capsules (2 g total) by mouth 2 (two) times daily. 04/23/21  Yes Revankar, Reita Cliche, MD  latanoprost (XALATAN) 0.005 % ophthalmic solution Place 1 drop into both eyes at bedtime.   Yes [provider]  levothyroxine (SYNTHROID) 112 MCG tablet Take 112 mcg by mouth once a week. 6 times a week   Yes [provider]  levothyroxine (SYNTHROID) 200 MCG tablet Take 200 mcg by mouth every 7 (seven) days.   Yes [provider]  loratadine (CLARITIN) 10 MG tablet Take 1 tablet by mouth daily. 02/16/20  Yes  [provider]  magic mouthwash (nystatin, hydrocortisone, diphenhydrAMINE) suspension Swish and spit 5 mLs 4 (four) times daily as needed for mouth pain.   Yes [provider]  metoprolol succinate (TOPROL-XL) 50 MG 24 hr tablet Take 50 mg by mouth 2 (two) times daily. 08/22/17  Yes [provider]  nitroGLYCERIN (NITROSTAT) 0.4 MG SL tablet Place 1 tablet (0.4 mg total) under the tongue every 5 (five) minutes as needed for chest pain. 04/21/21 07/20/21 Yes Revankar, Reita Cliche, MD  omeprazole (PRILOSEC) 20 MG capsule Take 20 mg by mouth 2 (two) times daily before a meal.   Yes [provider]  predniSONE (DELTASONE) 20 MG tablet Take 20 mg by mouth daily as needed (acute pain).   Yes [provider]  rOPINIRole (REQUIP) 0.5 MG tablet Take 0.5 mg by mouth in  the morning.   Yes [provider]  rOPINIRole (REQUIP) 2 MG tablet Take 2 mg by mouth at bedtime. 04/01/21  Yes [provider]  SLOW FE 142 (45 Fe) MG TBCR Take 45 mg by mouth daily. 04/01/21  Yes [provider]  sodium chloride (OCEAN) 0.65 % SOLN nasal spray Place 1 spray into both nostrils as needed for congestion.   Yes [provider]  spironolactone (ALDACTONE) 25 MG tablet Take 25 mg by mouth daily.   Yes [provider]  timolol (TIMOPTIC) 0.5 % ophthalmic solution Place 1 drop into both eyes 2 (two) times daily. 12/24/19  Yes [provider]  warfarin (COUMADIN) 3 MG tablet Take 3 mg by mouth 2 (two) times a week.   Yes [provider]  warfarin (COUMADIN) 4 MG tablet Take 4 mg by mouth 4 (four) times a week. 5 times weekly   Yes [provider]    Allergies    Atorvastatin calcium, Dorzolamide, Brimonidine, Brinzolamide-brimonidine, Lipitor [atorvastatin calcium], Rotigotine, Tamsulosin, Vancomycin, Zolpidem, and Dutasteride  Review of Systems   Review of Systems  Constitutional:  Negative for chills and fever.  HENT:   Negative for ear pain and sore throat.   Eyes:  Negative for pain and visual disturbance.  Respiratory:  Negative for cough and shortness of breath.   Cardiovascular:  Negative for chest pain and palpitations.  Gastrointestinal:  Negative for abdominal pain and vomiting.  Genitourinary:  Negative for dysuria and hematuria.  Musculoskeletal:  Negative for arthralgias and back pain.  Skin:  Negative for color change and rash.  Neurological:  Negative for seizures and syncope.  All other systems reviewed and are negative.  Physical Exam Updated Vital Signs BP (!) 154/72   Pulse (!) 40   Temp 98.2 F (36.8 C) (Oral)   Resp 12   SpO2 96%   Physical Exam Vitals and nursing note reviewed.  Constitutional:      Appearance: He is well-developed.     Comments: Mildly lethargic but readily arouses to voice and answering questions appropriately  HENT:     Head: Normocephalic and atraumatic.  Eyes:     Conjunctiva/sclera: Conjunctivae normal.  Cardiovascular:     Rate and Rhythm: Regular rhythm. Bradycardia present.     Heart sounds: No murmur heard. Pulmonary:     Effort: Pulmonary effort is normal. No respiratory distress.     Breath sounds: Normal breath sounds.  Abdominal:     Palpations: Abdomen is soft.     Tenderness: There is no abdominal tenderness.  Musculoskeletal:     Cervical back: Neck supple.  Skin:    General: Skin is warm and dry.  Neurological:     Mental Status: He is alert.     Comments: Readily arouses to voice, answers questions appropriately, normal strength and sensation in upper and lower extremities, pupils equal round and reactive to light, no facial droop, no pronator drift, normal finger-nose-finger    ED Results / Procedures / Treatments   Labs (all labs ordered are listed, but only abnormal results are displayed) Labs Reviewed  CBC WITH DIFFERENTIAL/PLATELET - Abnormal; Notable for the following components:      Result Value   RBC 3.26 (*)     Hemoglobin 10.6 (*)    HCT 32.3 (*)    Platelets 149 (*)    All other components within normal limits  BASIC METABOLIC PANEL - Abnormal; Notable for the following components:   Sodium 133 (*)  Glucose, Bld 125 (*)    GFR, Estimated 54 (*)    All other components within normal limits  PROTIME-INR - Abnormal; Notable for the following components:   Prothrombin Time 26.7 (*)    INR 2.5 (*)    All other components within normal limits  SARS CORONAVIRUS 2 (TAT 6-24 HRS)  MAGNESIUM  URINALYSIS, ROUTINE W REFLEX MICROSCOPIC  TROPONIN I (HIGH SENSITIVITY)  TROPONIN I (HIGH SENSITIVITY)    EKG EKG Interpretation  Date/Time:  Wednesday April 29 2021 03:59:54 EDT Ventricular Rate:  42 PR Interval:  217 QRS Duration: 169 QT Interval:  561 QTC Calculation: 469 R Axis:   -64 Text Interpretation: Sinus bradycardia Right bundle branch block LVH with IVCD and secondary repol abnrm Confirmed by Madalyn Rob [phone]) on 04/29/2021 4:27:06 AM  Radiology CT Head Wo Contrast  Result Date: 04/29/2021 CLINICAL DATA:  87 year old male found on the floor next to the bed, altered mental status, bradycardia. EXAM: CT HEAD WITHOUT CONTRAST TECHNIQUE: Contiguous axial images were obtained from the base of the skull through the vertex without intravenous contrast. COMPARISON:  Brain MRI 10/09/2016.  Head CT 10/27/2020. FINDINGS: Brain: No midline shift, ventriculomegaly, mass effect, evidence of mass lesion, intracranial hemorrhage or evidence of cortically based acute infarction. Chronically advanced bilateral white matter hypodensity and heterogeneity in the left thalamus appears stable and compatible with chronic small vessel disease. There are small chronic infarcts in the bilateral cerebellum, most notably the right SCA territory. Vascular: Calcified atherosclerosis at the skull base. No suspicious intracranial vascular hyperdensity. Skull: No acute osseous abnormality identified. Chronic degenerative  changes about the odontoid with subchondral cysts. Sinuses/Orbits: Chronic bilateral paranasal sinus disease is stable since December, aside from increased bubbly opacity in the right sphenoid now. Tympanic cavities and mastoids remain well aerated. Other: Calcified scalp vessel atherosclerosis. No acute orbit or scalp soft tissue finding. IMPRESSION: 1. No acute intracranial abnormality. 2. Advanced chronic small vessel ischemia appears stable since December. 3. Chronic paranasal sinus disease. Electronically Signed   By: Genevie Ann M.D.   On: 04/29/2021 04:50   CT Cervical Spine Wo Contrast  Result Date: 04/29/2021 CLINICAL DATA:  87 year old male found on the floor next to the bed, altered mental status, bradycardia. EXAM: CT CERVICAL SPINE WITHOUT CONTRAST TECHNIQUE: Multidetector CT imaging of the cervical spine was performed without intravenous contrast. Multiplanar CT image reconstructions were also generated. COMPARISON:  [facility] Cervical spine CT 11/14/2020. FINDINGS: Alignment: Stable since December. Mild degenerative anterolisthesis of C5 on C6, with straightening of upper cervical lordosis. Skull base and vertebrae: Osteopenia. Visualized skull base is intact. No atlanto-occipital dissociation. No acute osseous abnormality identified. Soft tissues and spinal canal: No prevertebral fluid or swelling. No visible canal hematoma. Bulky calcified cervical carotid atherosclerosis. Disc levels: Bulky degenerative ligamentous hypertrophy about the odontoid which demonstrates multiple degenerative subchondral cysts. C3-C4 ankylosis is chronic. Severe chronic disc and endplate degeneration at C4-C5. Degenerative anterolisthesis with disc, endplate, and posterior element degeneration at C5-C6. But only mild degenerative cervical spinal stenosis is suspected and appears stable. Upper chest: Calcified aortic atherosclerosis. Tortuous proximal great vessels. Grossly intact  visible upper thoracic levels. IMPRESSION: 1. No acute traumatic injury identified in the cervical spine. 2. Stable advanced cervical spine degeneration. 3. Aortic Atherosclerosis (ICD10-I70.0). Electronically Signed   By: Genevie Ann M.D.   On: 04/29/2021 04:53   DG Chest Portable 1 View  Result Date: 04/29/2021 CLINICAL DATA:  88 year old male status post fall. Altered  mental status. Bradycardia. EXAM: PORTABLE CHEST 1 VIEW COMPARISON:  Chest radiographs 07/19/2019. FINDINGS: Portable AP view at 0410 hours. Stable sequelae of CABG and mediastinal contours. Visualized tracheal air column is within normal limits. Mildly lower lung volumes. But when Allowing for portable technique the lungs are clear. No pneumothorax or pleural effusion. Paucity of bowel gas in the upper abdomen. No acute osseous abnormality identified. Partially visible chronic right shoulder arthroplasty. IMPRESSION: Lower lung volumes.  No acute cardiopulmonary abnormality. Electronically Signed   By: Genevie Ann M.D.   On: 04/29/2021 04:17    Procedures Procedures   Medications Ordered in ED Medications - No data to display  ED Course  I have reviewed the triage vital signs and the nursing notes.  Pertinent labs & imaging results that were available during my care of the patient were reviewed by me and considered in my medical decision making (see chart for details).    MDM Rules/Calculators/A&P                          87 year old male presents to ER with concern for bradycardia, lethargy, fall.  Reportedly fell from bed tonight.  Found to be bradycardic and somewhat lethargic.  On exam patient was readily arousable to verbal stimuli but was mildly lethargic.  Nonfocal neuro exam.  CT head negative.  Persistently bradycardic though blood pressure stable.  Does take metoprolol but no recent change in this dosing per my review of his cardiology note.  Discussed case with cardiology, they will come evaluate has formal consults but  recommend admission to medicine for observation.  Hold beta-blocker.  Discussed with internal medicine teaching service who will come evaluate patient.  Final Clinical Impression(s) / ED Diagnoses Final diagnoses:  Bradycardia  Lethargy    Rx / DC Orders ED Discharge Orders     None        Lucrezia Starch, MD 04/29/21 [phone]

## 2021-04-29 NOTE — ED Triage Notes (Signed)
Pt EMS arrival from [facility] post being found by spouse sleeping upright on floor on side of bed. Pt difficult to arouse by staff, bradycardiac upon assessment, per EMS pt difficult to arouse, alert to verbal stimuli.

## 2021-04-29 NOTE — Consult Note (Addendum)
Cardiology Consultation:   Patient ID: Peter Beltran MRN: [id number]; DOB: [birth year]  Admit date: 04/29/2021 Date of Consult: 04/29/2021  PCP:  Javier Glazier, MD   [facility] Cardiologist:  Dr. Orlean Patten here to update MD or APP on Care Team, Refresh:1}     Patient Profile:   Peter Beltran is a 87 y.o. male with a hx of CAD, hypertension, mechanical AVR on coumadin, mixed hyperlipidemia who is being seen 04/29/2021 for the evaluation of bradycardia at the request of Dr. Evette Doffing.  History of Present Illness:   Peter Beltran was last seen by Dr. Geraldo Pitter on 04/15/21. At that visit, he was doing well, blood pressure well controlled, no acute concerns. Heart rate at that visit was 54 bpm on metoprolol per note (patient is unsure of medications).  Per report, patient was at his assisted living facility and found sleeping on the floor next to the bed. He was initially difficult to arouse, but when he awoke he was alert. When vitals were checked, his heart rate was in the 40s. Per the chart, his wife noted that he was not acting like himself, had confusion which is not his norm. Also reported history of chest pain that morning that resolved with nitroglycerin.  When I interviewed the patient, he was calm and alert. When I asked if he knew what happened, he said," yes, it's happened before. The [place] came to get me and take me to the hospital. Then they started speaking English and sounded like Americans, and I was lucky enough to end up in an American hospital." He denies any chest pain or shortness of breath. Denies any current concerns. Does not feel dizzy or lighteaded currently.   Past Medical History:  Diagnosis Date   Acute kidney failure, unspecified ([place]) 07/22/2019   Acute maxillary sinusitis, unspecified 08/16/2017   Allergic rhinitis, unspecified 02/04/2018   Anxiety disorder, unspecified 09/17/2013   Anxiety state 09/17/2013   Aortic stenosis     post aortic valve replacement with St.Jude's valve by Dr.VanTrigt on 07-27-11   Aortic valve disorder 05/29/2013   Atherosclerotic heart disease of native coronary artery without angina pectoris 08/16/2017   Benign prostatic hyperplasia with lower urinary tract symptoms 08/16/2017   Candidal stomatitis 08/16/2017   Cellulitis, unspecified 07/25/2019   Chest pain 04/10/2016   Chronic airway obstruction ([place]) 04/21/2013   Chronic coronary artery disease 04/10/2016   Formatting of this note might be different from the original. Overview:  status post CABG x3 in 2008 Formatting of this note might be different from the original. Overview:  Overview:  status post CABG x3 in 2008   Chronic rhinitis 12/27/2019   Last Assessment & Plan:  Concern over his nose. Chronic recurring nasal congestion.  Had a severe episode recently that responded to over-the-counter medication.  Currently asymptomatic.  Wants to make sure all is okay. EXAM intranasally shows no polyps, purulence, masses or obstructing anatomy.  Mucosa is overall healthy. PLAN: Reassured all looks okay.  I think what he is doing is fine.  Happy t   Cognitive communication deficit 10/28/2020   Constipation 08/16/2017   Constipation, unspecified 08/16/2017   Coronary artery disease    status post CABG x3 in 2008   Cough 10/20/2016   Diabetic polyneuropathy associated with type 2 diabetes mellitus ([place]) 10/04/2016   Disorder of the skin and subcutaneous tissue, unspecified 07/25/2019   Disorders of muscle in diseases classified elsewhere, multiple sites 10/28/2020   Disturbance of salivary secretion  04/21/2013   Dry mouth, unspecified 10/18/2018   Dyslipidemia    Dysphagia 04/21/2013   Encounter for current long-term use of anticoagulants 05/29/2013   Formatting of this note might be different from the original. IMO routine update Formatting of this note might be different from the original. Overview:  IMO routine update   Encounter for immunization 07/25/2019    Enlarged prostate with lower urinary tract symptoms (LUTS) 05/29/2013   Esophageal reflux 02/26/2014   Essential hypertension 01/19/2019   Follow-up visit for aortic valve replacement with metallic valve 04/20/4402   Foreign body aspiration 03/05/2016   Gastro-esophageal reflux disease without esophagitis 02/26/2014   Generalized edema 10/18/2018   Gout    Gouty arthropathy 05/29/2013   Hidrocystoma of left eyelid 12/10/2014   History of falling 10/28/2020   Hyperlipidemia, unspecified 11/11/2016   Hyperplasia of prostate    benign   Hyperplasia of prostate    benign   Hypersomnia 06/14/2013   Hypertension    Hypertensive heart disease with heart failure ([place]) 10/18/2018   Hypertriglyceridemia 11/11/2016   Hypothyroidism    Hypothyroidism, unspecified 04/10/2016   Impacted cerumen, bilateral 08/16/2017   Impaired fasting glucose 04/21/2013   Iron deficiency anemia, unspecified 08/16/2017   Ischemic optic neuropathy of both eyes 01/28/2016   Localized edema 07/25/2019   Long term (current) use of anticoagulants 05/29/2013   Overview:  IMO routine update IMO routine update   Long term (current) use of aspirin 08/16/2017   Low back pain 08/16/2017   Low-tension glaucoma of both eyes, 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/2016   tmax 12/14 OCT 11/03/2016 VF 12/04/2015 Gonio 05/07/2015 tmax 12/14 OCT 11/03/2016 VF 12/04/2015 Gonio 05/07/2015   Low-tension glaucoma, bilateral, 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/2016   tmax 12/14 OCT 11/03/2016 VF 12/04/2015 Gonio 05/07/2015 tmax 12/14 OCT 11/03/2016 VF 12/04/2015 Gonio 05/07/2015   Lumbar stenosis 04/30/2015   Memory loss, short term 03/30/2016   Mild cognitive impairment 08/30/2016   Mixed dyslipidemia 01/19/2019   Moderate persistent asthma without complication 4/74/2595   Moderate persistent asthma, uncomplicated 6/38/7564   Monitoring for long-term anticoagulant use 08/03/2017   Muscle weakness (generalized) 10/28/2020   Myocardial infarct ([place])    Nasal congestion 02/04/2018    Neuropathy 04/04/2014   Obstructive sleep apnea 12/17/2013   Obstructive sleep apnea of adult 12/17/2013   Old myocardial infarction 08/22/2019   Oral mucositis (ulcerative), unspecified 10/28/2020   Osteoarthrosis 04/21/2013   Other malaise 07/25/2019   Other specified disorders of nose and nasal sinuses 10/28/2020   Overweight 01/19/2019   Pain in the chest    Pain in thoracic spine 10/18/2018   Periodic limb movements of sleep 08/26/2013   Presence of aortocoronary bypass graft 10/18/2018   Presence of prosthetic heart valve 10/18/2018   Pseudophakia of both eyes 12/10/2014   Psoriasis and similar disorder 01/28/2014   Rash and other nonspecific skin eruption 08/16/2017   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/28/2013   Restless legs syndrome 08/26/2013   Spells 08/26/2013   Spinal stenosis, lumbar region without neurogenic claudication 04/30/2015   Status post aortic valve replacement with metallic valve 01/15/2950   Status post shoulder joint replacement    right shoulder   Subtherapeutic anticoagulation 04/10/2016   Syncope and collapse 06/14/2013   Overview:  with seizure like activity. with seizure like activity.   Traumatic subarachnoid hemorrhage without loss of consciousness, subsequent encounter 10/28/2020   Type 2 diabetes mellitus with diabetic neuropathy, unspecified ([place]) 08/16/2017   Unspecified abnormalities of gait and mobility 10/18/2018   Unspecified  atrial fibrillation ([place]) 03/30/2017   Unspecified osteoarthritis, unspecified site 04/21/2013   Unspecified urinary incontinence 05/17/2018   Urinary retention 07/18/2018   Vitamin D deficiency, unspecified 08/16/2017    Past Surgical History:  Procedure Laterality Date   AORTIC VALVE REPLACEMENT     CORONARY ARTERY BYPASS GRAFT     PROSTATE ABLATION     TONSILLECTOMY       Home Medications:  Prior to Admission medications   Medication Sig Start Date End Date Taking? Authorizing Provider  acetaminophen (TYLENOL) 650 MG CR tablet Take 650 mg  by mouth in the morning and at bedtime.   Yes [provider]  albuterol (PROVENTIL HFA;VENTOLIN HFA) 108 (90 Base) MCG/ACT inhaler Inhale 2 puffs into the lungs every 6 (six) hours as needed for wheezing or shortness of breath.   Yes [provider]  allopurinol (ZYLOPRIM) 300 MG tablet Take 300 mg by mouth daily.   Yes [provider]  antiseptic oral rinse (BIOTENE) LIQD 30 mLs by Mouth Rinse route in the morning and at bedtime.   Yes [provider]  clonazePAM (KLONOPIN) 1 MG tablet Take 1 mg by mouth at bedtime. 01/02/20  Yes [provider]  Docusate Sodium (DSS) 100 MG CAPS Take 100 mg by mouth as needed for constipation.   Yes [provider]  ergocalciferol (VITAMIN D2) 1.25 MG (50000 UT) capsule Take 50,000 Units by mouth every Tuesday.   Yes [provider]  fenofibrate (TRICOR) 145 MG tablet Take 145 mg by mouth daily.   Yes [provider]  Fluticasone-Salmeterol (ADVAIR) 100-50 MCG/DOSE AEPB Inhale 2 puffs into the lungs 2 (two) times daily.   Yes [provider]  gabapentin (NEURONTIN) 100 MG capsule Take 100 mg by mouth daily.   Yes [provider]  gabapentin (NEURONTIN) 300 MG capsule Take 300 mg by mouth at bedtime. 11/29/19  Yes [provider]  icosapent Ethyl (VASCEPA) 1 g capsule Take 2 capsules (2 g total) by mouth 2 (two) times daily. 04/23/21  Yes Revankar, Reita Cliche, MD  latanoprost (XALATAN) 0.005 % ophthalmic solution Place 1 drop into both eyes at bedtime.   Yes [provider]  levothyroxine (SYNTHROID) 112 MCG tablet Take 112 mcg by mouth daily before breakfast. 6 days a week   Yes [provider]  levothyroxine (SYNTHROID) 200 MCG tablet Take 200 mcg by mouth every 7 (seven) days.   Yes [provider]  loratadine (CLARITIN) 10 MG tablet Take 1 tablet by mouth daily. 02/16/20  Yes [provider]  magic mouthwash (nystatin, hydrocortisone,  diphenhydrAMINE) suspension Swish and spit 5 mLs 4 (four) times daily as needed for mouth pain.   Yes [provider]  metoprolol succinate (TOPROL-XL) 50 MG 24 hr tablet Take 50 mg by mouth 2 (two) times daily. 08/22/17  Yes [provider]  nitroGLYCERIN (NITROSTAT) 0.4 MG SL tablet Place 1 tablet (0.4 mg total) under the tongue every 5 (five) minutes as needed for chest pain. 04/21/21 07/20/21 Yes Revankar, Reita Cliche, MD  omeprazole (PRILOSEC) 20 MG capsule Take 20 mg by mouth 2 (two) times daily before a meal.   Yes [provider]  predniSONE (DELTASONE) 20 MG tablet Take 20 mg by mouth daily as needed (acute pain).   Yes [provider]  rOPINIRole (REQUIP) 0.5 MG tablet Take 0.5 mg by mouth in the morning.   Yes [provider]  rOPINIRole (REQUIP) 2 MG tablet Take 2 mg by mouth at  bedtime. 04/01/21  Yes [provider]  SLOW FE 142 (45 Fe) MG TBCR Take 45 mg by mouth daily. 04/01/21  Yes [provider]  sodium chloride (OCEAN) 0.65 % SOLN nasal spray Place 1 spray into both nostrils as needed for congestion.   Yes [provider]  spironolactone (ALDACTONE) 25 MG tablet Take 25 mg by mouth daily.   Yes [provider]  timolol (TIMOPTIC) 0.5 % ophthalmic solution Place 1 drop into both eyes 2 (two) times daily. 12/24/19  Yes [provider]  warfarin (COUMADIN) 3 MG tablet Take 3 mg by mouth 2 (two) times a week.   Yes [provider]  warfarin (COUMADIN) 4 MG tablet Take 4 mg by mouth 4 (four) times a week. 5 times weekly   Yes [provider]    Inpatient Medications: Scheduled Meds:  allopurinol  300 mg Oral Daily   fluticasone furoate-vilanterol  1 puff Inhalation Daily   latanoprost  1 drop Both Eyes QHS   levothyroxine  112 mcg Oral Once per day on Sun Mon Tue Wed Thu Fri   And   [START ON 05/02/2021] levothyroxine  200 mcg Oral Once per day on Sat   timolol  1 drop Both Eyes BID    Warfarin - Pharmacist Dosing Inpatient   Does not apply q1600   Continuous Infusions:  PRN Meds: acetaminophen **OR** acetaminophen, albuterol, polyethylene glycol  Allergies:    Allergies  Allergen Reactions   Atorvastatin Calcium Other (See Comments)    Developed myopathy. Developed myopathy.    Dorzolamide Other (See Comments)    Follicula Follicula    Brimonidine Other (See Comments)    Red and burning eyes   Brinzolamide-Brimonidine     Redness in eyes - reaction to Bowlus [Atorvastatin Calcium]     Developed myopathy.   Rotigotine Other (See Comments)    Falling asleep uncontrollably - reaction to Neupro   Tamsulosin Other (See Comments)    Unknown reaction (Flomax)   Vancomycin     Red Rash   Zolpidem Other (See Comments)    Pt. states that he possibly had a neuro reaction.   Dutasteride Rash    Reaction to Avodart    Social History:   Social History   Socioeconomic History   Marital status: Married    Spouse name: Not on file   Number of children: Not on file   Years of education: Not on file   Highest education level: Not on file  Occupational History   Not on file  Tobacco Use   Smoking status: Former    Pack years: 0.00   Smokeless tobacco: Never  Substance and Sexual Activity   Alcohol use: No   Drug use: No   Sexual activity: Not on file  Other Topics Concern   Not on file  Social History Narrative   Not on file   Social Determinants of Health   Financial Resource Strain: Not on file  Food Insecurity: Not on file  Transportation Needs: Not on file  Physical Activity: Not on file  Stress: Not on file  Social Connections: Not on file  Intimate Partner Violence: Not on file    Family History:    Family History  Problem Relation Age of Onset   Hypertension Neg Hx    Diabetes Neg Hx    Cancer Neg Hx    Heart disease Neg Hx      ROS:  Please see the history  of present illness.  Constitutional: Negative for chills,  fever, night sweats, unintentional weight loss  HENT: Negative for ear pain and hearing loss.   Eyes: Negative for loss of vision and eye pain.  Respiratory: Negative for cough, sputum, wheezing.   Cardiovascular: See HPI. Gastrointestinal: Negative for abdominal pain, melena, and hematochezia.  Genitourinary: Negative for dysuria and hematuria.  Musculoskeletal: Negative for falls and myalgias.  Skin: Negative for itching and rash.  Neurological: Negative for focal weakness, focal sensory changes and loss of consciousness.  Endo/Heme/Allergies: Does not bruise/bleed easily.   All other ROS reviewed and negative.     Physical Exam/Data:   Vitals:   04/29/21 0800 04/29/21 0850 04/29/21 1222 04/29/21 1557  BP: (!) 148/62 (!) 173/90 (!) 148/122 (!) 129/58  Pulse: (!) 41 (!) 54 (!) 50 (!) 50  Resp: 14 15 18 14   Temp:  (!) 97.5 F (36.4 C)  97.9 F (36.6 C)  TempSrc:  Oral  Oral  SpO2: 95% 98% 96% 94%  Weight:  94.5 kg    Height:  5\' 8"  (1.727 m)     No intake or output data in the 24 hours ending 04/29/21 1711 Last 3 Weights 04/29/2021 04/15/2021 12/12/2020  Weight (lbs) 208 lb 6.4 oz 216 lb 218 lb 1.9 oz  Weight (kg) 94.53 kg 97.977 kg 98.939 kg     Body mass index is 31.69 kg/m.  General:  Well nourished, well developed, in no acute distress HEENT: normal Lymph: no adenopathy Neck: no JVD Endocrine:  No thryomegaly Vascular: No carotid bruits; RA pulses 2+ bilaterall Cardiac:  normal S1, S2; RRR; no murmur Crisp mechanical click Lungs:  clear to auscultation bilaterally, no wheezing, rhonchi or rales  Abd: soft, nontender, no hepatomegaly  Ext: no edema Musculoskeletal:  No deformities, BUE and BLE strength normal and equal Skin: warm and dry  Neuro:  CNs 2-12 intact, no focal abnormalities noted Psych:  Normal affect   EKG:  The EKG was personally reviewed and demonstrates:  sinus bradycardia, RBBB, LVH at 42 bpm Telemetry:  Telemetry was personally reviewed and  demonstrates:  Heart rate range 40s-70s. Predominantly sinus bradycardia around 50 bpm with periodic dips into the 40s. No pauses. Rare PAC and PVC  Relevant CV Studies: Echo 01/05/21 1. Left ventricular ejection fraction, by estimation, is 55 to 60%. The  left ventricle has normal function. The left ventricle has no regional  wall motion abnormalities. Left ventricular diastolic function could not  be evaluated.   2. Right ventricular systolic function is normal. The right ventricular  size is normal. There is normal pulmonary artery systolic pressure.   3. Left atrial size was moderately dilated.   4. The mitral valve is normal in structure. Moderate mitral valve  regurgitation. Mild mitral stenosis.   5. Satisfactory function of the aortic valve.. The aortic valve has been  repaired/replaced. Aortic valve regurgitation is not visualized. No aortic  stenosis is present. There is a St. Jude mechanical valve present in the  aortic position. Procedure Date:   2012.   6. The inferior vena cava is normal in size with greater than 50%  respiratory variability, suggesting right atrial pressure of 3 mmHg.   Laboratory Data:  High Sensitivity Troponin:   Recent Labs  Lab 04/29/21 0511 04/29/21 0758  TROPONINIHS 8 8     Chemistry Recent Labs  Lab 04/29/21 0355  NA 133*  K 4.3  CL 100  CO2 24  GLUCOSE 125*  BUN 23  CREATININE 1.24  CALCIUM 9.4  GFRNONAA 54*  ANIONGAP 9    No results for input(s): PROT, ALBUMIN, AST, ALT, ALKPHOS, BILITOT in the last 168 hours. Hematology Recent Labs  Lab 04/29/21 0355  WBC 5.2  RBC 3.26*  HGB 10.6*  HCT 32.3*  MCV 99.1  MCH 32.5  MCHC 32.8  RDW 15.2  PLT 149*   BNPNo results for input(s): BNP, PROBNP in the last 168 hours.  DDimer No results for input(s): DDIMER in the last 168 hours.   Radiology/Studies:  CT Head Wo Contrast  Result Date: 04/29/2021 CLINICAL DATA:  87 year old male found on the floor next to the bed, altered  mental status, bradycardia. EXAM: CT HEAD WITHOUT CONTRAST TECHNIQUE: Contiguous axial images were obtained from the base of the skull through the vertex without intravenous contrast. COMPARISON:  Brain MRI 10/09/2016.  Head CT 10/27/2020. FINDINGS: Brain: No midline shift, ventriculomegaly, mass effect, evidence of mass lesion, intracranial hemorrhage or evidence of cortically based acute infarction. Chronically advanced bilateral white matter hypodensity and heterogeneity in the left thalamus appears stable and compatible with chronic small vessel disease. There are small chronic infarcts in the bilateral cerebellum, most notably the right SCA territory. Vascular: Calcified atherosclerosis at the skull base. No suspicious intracranial vascular hyperdensity. Skull: No acute osseous abnormality identified. Chronic degenerative changes about the odontoid with subchondral cysts. Sinuses/Orbits: Chronic bilateral paranasal sinus disease is stable since December, aside from increased bubbly opacity in the right sphenoid now. Tympanic cavities and mastoids remain well aerated. Other: Calcified scalp vessel atherosclerosis. No acute orbit or scalp soft tissue finding. IMPRESSION: 1. No acute intracranial abnormality. 2. Advanced chronic small vessel ischemia appears stable since December. 3. Chronic paranasal sinus disease. Electronically Signed   By: Genevie Ann M.D.   On: 04/29/2021 04:50   CT Cervical Spine Wo Contrast  Result Date: 04/29/2021 CLINICAL DATA:  87 year old male found on the floor next to the bed, altered mental status, bradycardia. EXAM: CT CERVICAL SPINE WITHOUT CONTRAST TECHNIQUE: Multidetector CT imaging of the cervical spine was performed without intravenous contrast. Multiplanar CT image reconstructions were also generated. COMPARISON:  [facility] Cervical spine CT 11/14/2020. FINDINGS: Alignment: Stable since December. Mild degenerative anterolisthesis of  C5 on C6, with straightening of upper cervical lordosis. Skull base and vertebrae: Osteopenia. Visualized skull base is intact. No atlanto-occipital dissociation. No acute osseous abnormality identified. Soft tissues and spinal canal: No prevertebral fluid or swelling. No visible canal hematoma. Bulky calcified cervical carotid atherosclerosis. Disc levels: Bulky degenerative ligamentous hypertrophy about the odontoid which demonstrates multiple degenerative subchondral cysts. C3-C4 ankylosis is chronic. Severe chronic disc and endplate degeneration at C4-C5. Degenerative anterolisthesis with disc, endplate, and posterior element degeneration at C5-C6. But only mild degenerative cervical spinal stenosis is suspected and appears stable. Upper chest: Calcified aortic atherosclerosis. Tortuous proximal great vessels. Grossly intact visible upper thoracic levels. IMPRESSION: 1. No acute traumatic injury identified in the cervical spine. 2. Stable advanced cervical spine degeneration. 3. Aortic Atherosclerosis (ICD10-I70.0). Electronically Signed   By: Genevie Ann M.D.   On: 04/29/2021 04:53   DG Chest Portable 1 View  Result Date: 04/29/2021 CLINICAL DATA:  87 year old male status post fall. Altered mental status. Bradycardia. EXAM: PORTABLE CHEST 1 VIEW COMPARISON:  Chest radiographs 07/19/2019. FINDINGS: Portable AP view at 0410 hours. Stable sequelae of CABG and mediastinal contours. Visualized tracheal air column is within normal limits. Mildly lower lung volumes. But when Allowing for portable technique the lungs  are clear. No pneumothorax or pleural effusion. Paucity of bowel gas in the upper abdomen. No acute osseous abnormality identified. Partially visible chronic right shoulder arthroplasty. IMPRESSION: Lower lung volumes.  No acute cardiopulmonary abnormality. Electronically Signed   By: Genevie Ann M.D.   On: 04/29/2021 04:17     Assessment and Plan:   Bradycardia -agree with holding metoprolol succinate  50 mg BID -prior heart rates (presumed on metoprolol) were in the 50s -would allow metoprolol to wash out for at least 48 hours -unlikely that this was the cause of his episode. If had been severe bradycardia/asystole, would expect that he would have been without a pulse and/or at a minimum have elevated troponins from demand. Troponins are normal and flat. This makes cardiac etiology unlikely. -unclear what the etiology of his event was, but today he initially seemed appropriate on interview, but on deeper questioning had some disconnection with reality. Defer to primary team for further evaluation.  Mechanical AVR -valve is crisp on exam -reviewed prior echo -coumadin per pharmacy  Risk Assessment/Risk Scores:  N/a  For questions or updates, please contact [place] Please consult [email or web address] for contact info under    Signed, Buford Dresser, MD  04/29/2021 5:11 PM

## 2021-04-29 NOTE — ED Notes (Signed)
Spoke with Sherric, LPN in regards to pt DNR, facility to fax over DNR for [facility] records, Roslynn Amble, MD made aware.

## 2021-04-29 NOTE — ED Notes (Signed)
Pt Daughter called took her number down and gave it to Dr Roslynn Amble.

## 2021-04-29 NOTE — ED Notes (Signed)
Peter Beltran, son of rm 28, [phone] would like an update when available

## 2021-04-29 NOTE — Evaluation (Signed)
Physical Therapy Evaluation Patient Details Name: Peter Beltran MRN: [id number] DOB: Mar 25, 1925 Today's Date: 04/29/2021   History of Present Illness  Pt is a 87 y/o male presenting to [facility] ED 04/29/2021  after fall, found to endorse symptomatic bradycardia. CT head negative. HsTrop negative. No ischemic changes on EKG. PMH significant of AVR w/ metal stent, CAD, HTN, HLD, DM, OSA.  Clinical Impression  Pt demonstrates generalized weakness and deficits in activity tolerance, endurance, balance, and safety awareness. Pt tolerates ambulation of limited community distances with use of rollator and no physical assistance, and reports feeling hypoxic despite stable oxygen sat levels. Pt reports feelings of weakness during gait activities. Pt requires cues to manage rollator brakes and proximity during ambulation. Pt's weakness and requirement of cues for device management pose as a falls risk. Pt will benefit from continued acute PT to address safety awareness and improve independence in mobility. SPT recommends HHPT to aid in return to prior level and increase tolerance to activity.     Follow Up Recommendations Home health PT    Equipment Recommendations  None recommended by PT    Recommendations for Other Services       Precautions / Restrictions Precautions Precautions: Fall Restrictions Weight Bearing Restrictions: No      Mobility  Bed Mobility Overal bed mobility: Needs Assistance Bed Mobility: Supine to Sit     Supine to sit: Min guard;HOB elevated     General bed mobility comments: min G for safety.    Transfers Overall transfer level: Needs assistance Equipment used: 4-wheeled walker Transfers: Sit to/from Stand Sit to Stand: Min guard         General transfer comment: min G for safety and cues for hand placement. Pt reliant on LE support from dropped bed rail with posterior lean at EOB to power to stand.  Ambulation/Gait Ambulation/Gait assistance: Min  guard Gait Distance (Feet): 450 Feet Assistive device: 4-wheeled walker Gait Pattern/deviations: Trunk flexed;Wide base of support;Decreased stride length;Step-through pattern Gait velocity: reduced Gait velocity interpretation: 1.31 - 2.62 ft/sec, indicative of limited community ambulator General Gait Details: Slow, mostly steady gait and no overt LOB noted. Pt encouraged to take seated rest break during session, as he demonstrated increased work of breathing.  Stairs            Wheelchair Mobility    Modified Rankin (Stroke Patients Only)       Balance Overall balance assessment: Needs assistance Sitting-balance support: Feet supported Sitting balance-Leahy Scale: Fair     Standing balance support: During functional activity;Bilateral upper extremity supported;Single extremity supported Standing balance-Leahy Scale: Poor Standing balance comment: Pt reliant on at least single UE support                             Pertinent Vitals/Pain Pain Assessment: No/denies pain    Home Living Family/patient expects to be discharged to:: Assisted living                 Additional Comments: Pt from [facility] with spouse.    Prior Function Level of Independence: Independent with assistive device(s)         Comments: Pt reports being independent with rollator. Pt reports performing ADLs independently, but states that he is incontinent.     Hand Dominance        Extremity/Trunk Assessment   Upper Extremity Assessment Upper Extremity Assessment: Defer to OT evaluation    Lower Extremity Assessment Lower  Extremity Assessment: Generalized weakness       Communication   Communication: No difficulties  Cognition Arousal/Alertness: Awake/alert Behavior During Therapy: WFL for tasks assessed/performed Overall Cognitive Status: Impaired/Different from baseline Area of Impairment: Orientation;Safety/judgement;Awareness;Problem solving                  Orientation Level: Disoriented to;Time   Memory: Decreased short-term memory   Safety/Judgement: Decreased awareness of safety;Decreased awareness of deficits Awareness: Emergent Problem Solving: Requires verbal cues General Comments: Pt disoriented to year and date, able to recall month. Pt reports believing he was in [place] last night. Pt requires multiple reminders for management of device and hand placement during session. Pt requires encouragement to take breaks during session.      General Comments General comments (skin integrity, edema, etc.): Pt HR initally between 49-56. During ambulation, pt encouraged to take seated rest break and reports feeling hypoxic despite VSS on RA.    Exercises     Assessment/Plan    PT Assessment Patient needs continued PT services  PT Problem List Decreased strength;Decreased activity tolerance;Decreased balance;Decreased mobility;Decreased cognition;Decreased knowledge of use of DME;Decreased safety awareness;Decreased knowledge of precautions       PT Treatment Interventions DME instruction;Gait training;Functional mobility training;Therapeutic activities;Therapeutic exercise;Balance training;Patient/family education;Cognitive remediation    PT Goals (Current goals can be found in the Care Plan section)  Acute Rehab PT Goals Patient Stated Goal: Get better and go home PT Goal Formulation: With patient Time For Goal Achievement: 05/13/21 Potential to Achieve Goals: Good    Frequency Min 3X/week   Barriers to discharge        Co-evaluation               AM-PAC PT "6 Clicks" Mobility  Outcome Measure Help needed turning from your back to your side while in a flat bed without using bedrails?: A Little Help needed moving from lying on your back to sitting on the side of a flat bed without using bedrails?: A Little Help needed moving to and from a bed to a chair (including a wheelchair)?: A Little Help needed  standing up from a chair using your arms (e.g., wheelchair or bedside chair)?: A Little Help needed to walk in hospital room?: A Little Help needed climbing 3-5 steps with a railing? : A Lot 6 Click Score: 17    End of Session Equipment Utilized During Treatment: Gait belt Activity Tolerance: Patient tolerated treatment well Patient left: in chair;with chair alarm set;with call bell/phone within reach Nurse Communication: Mobility status PT Visit Diagnosis: Other abnormalities of gait and mobility (R26.89);History of falling (Z91.81);Muscle weakness (generalized) (M62.81);Unsteadiness on feet (R26.81)    Time: 4854-6270 PT Time Calculation (min) (ACUTE ONLY): 45 min   Charges:   PT Evaluation $PT Eval Low Complexity: 1 Low PT Treatments $Gait Training: 8-22 mins        Acute Rehab  Pager: [phone]   Garwin Brothers, SPT  04/29/2021, 5:13 PM

## 2021-04-29 NOTE — Progress Notes (Signed)
[place] for Warfarin Indication:  Aortic Valve Replacement  Allergies  Allergen Reactions   Atorvastatin Calcium Other (See Comments)    Developed myopathy. Developed myopathy.    Dorzolamide Other (See Comments)    Follicula Follicula    Brimonidine Other (See Comments)    Red and burning eyes   Brinzolamide-Brimonidine     Redness in eyes - reaction to [place] [Atorvastatin Calcium]     Developed myopathy.   Rotigotine Other (See Comments)    Falling asleep uncontrollably - reaction to Neupro   Tamsulosin Other (See Comments)    Unknown reaction (Flomax)   Vancomycin     Red Rash   Zolpidem Other (See Comments)    Pt. states that he possibly had a neuro reaction.   Dutasteride Rash    Reaction to Avodart    Patient Measurements:     Vital Signs: Temp: 98.2 F (36.8 C) (06/15 0534) Temp Source: Oral (06/15 0534) BP: 148/62 (06/15 0800) Pulse Rate: 41 (06/15 0800)  Labs: Recent Labs    04/29/21 0355 04/29/21 0511  HGB 10.6*  --   HCT 32.3*  --   PLT 149*  --   LABPROT  --  26.7*  INR  --  2.5*  CREATININE 1.24  --   TROPONINIHS  --  8    Estimated Creatinine Clearance: 41.3 mL/min (by C-G formula based on SCr of 1.24 mg/dL).   Medical History:   Medications:  Scheduled:   allopurinol  300 mg Oral Daily   fluticasone furoate-vilanterol  1 puff Inhalation Daily   latanoprost  1 drop Both Eyes QHS   levothyroxine  112 mcg Oral Weekly   spironolactone  25 mg Oral Daily   timolol  1 drop Both Eyes BID    Assessment:  From facility for disorientation. PTA warfarin 26mg  weekly, 3mg  twice weekly, 4mg  four times per week. INR today is 2.5, therapeutic but at the low end. Hgb 10, PLT 149. No signs of overt bleeding by visual assessment.  Goal of Therapy:  INR Target 2.5-3.5 (according to [facility] Dr. Eulas Post 03/16/2021) Monitor platelets by anticoagulation protocol: Yes   Plan:  Warfarin 4mg   x1 Daily INR Monitor for signs and symptoms of bleeding  Norina Buzzard, PharmD PGY1 Pharmacy Resident 04/29/2021 8:20 AM

## 2021-04-30 DIAGNOSIS — Z20822 Contact with and (suspected) exposure to covid-19: Secondary | ICD-10-CM | POA: Diagnosis not present

## 2021-04-30 DIAGNOSIS — R072 Precordial pain: Secondary | ICD-10-CM

## 2021-04-30 DIAGNOSIS — R001 Bradycardia, unspecified: Secondary | ICD-10-CM

## 2021-04-30 DIAGNOSIS — G4761 Periodic limb movement disorder: Secondary | ICD-10-CM

## 2021-04-30 DIAGNOSIS — Z952 Presence of prosthetic heart valve: Secondary | ICD-10-CM | POA: Diagnosis not present

## 2021-04-30 DIAGNOSIS — R4182 Altered mental status, unspecified: Secondary | ICD-10-CM | POA: Diagnosis not present

## 2021-04-30 DIAGNOSIS — W19XXXA Unspecified fall, initial encounter: Secondary | ICD-10-CM | POA: Diagnosis present

## 2021-04-30 DIAGNOSIS — I2510 Atherosclerotic heart disease of native coronary artery without angina pectoris: Secondary | ICD-10-CM | POA: Diagnosis not present

## 2021-04-30 DIAGNOSIS — Z7901 Long term (current) use of anticoagulants: Secondary | ICD-10-CM

## 2021-04-30 HISTORY — DX: Unspecified fall, initial encounter: W19.XXXA

## 2021-04-30 HISTORY — DX: Periodic limb movement disorder: G47.61

## 2021-04-30 LAB — COMPREHENSIVE METABOLIC PANEL
ALT: 17 U/L (ref 0–44)
AST: 30 U/L (ref 15–41)
Albumin: 3.6 g/dL (ref 3.5–5.0)
Alkaline Phosphatase: 46 U/L (ref 38–126)
Anion gap: 9 (ref 5–15)
BUN: 23 mg/dL (ref 8–23)
CO2: 24 mmol/L (ref 22–32)
Calcium: 9.6 mg/dL (ref 8.9–10.3)
Chloride: 101 mmol/L (ref 98–111)
Creatinine, Ser: 0.98 mg/dL (ref 0.61–1.24)
GFR, Estimated: >60 mL/min (ref >60)
Glucose, Bld: 118 mg/dL — ABNORMAL HIGH (ref 70–99)
Potassium: 4.2 mmol/L (ref 3.5–5.1)
Sodium: 134 mmol/L — ABNORMAL LOW (ref 135–145)
Total Bilirubin: 0.8 mg/dL (ref 0.3–1.2)
Total Protein: 7.2 g/dL (ref 6.5–8.1)

## 2021-04-30 LAB — URINALYSIS, ROUTINE W REFLEX MICROSCOPIC
Bilirubin Urine: NEGATIVE
Glucose, UA: NEGATIVE mg/dL
Hgb urine dipstick: NEGATIVE
Ketones, ur: NEGATIVE mg/dL
Leukocytes,Ua: NEGATIVE
Nitrite: NEGATIVE
Protein, ur: NEGATIVE mg/dL
Specific Gravity, Urine: 1.008 (ref 1.005–1.030)
pH: 7 (ref 5.0–8.0)

## 2021-04-30 LAB — CBC
HCT: 34.7 % — ABNORMAL LOW (ref 39.0–52.0)
Hemoglobin: 11.9 g/dL — ABNORMAL LOW (ref 13.0–17.0)
MCH: 32.9 pg (ref 26.0–34.0)
MCHC: 34.3 g/dL (ref 30.0–36.0)
MCV: 95.9 fL (ref 80.0–100.0)
Platelets: 170 10*3/uL (ref 150–400)
RBC: 3.62 MIL/uL — ABNORMAL LOW (ref 4.22–5.81)
RDW: 14.8 % (ref 11.5–15.5)
WBC: 6.3 10*3/uL (ref 4.0–10.5)
nRBC: 0 % (ref 0.0–0.2)

## 2021-04-30 LAB — PROTIME-INR
INR: 2.7 — ABNORMAL HIGH (ref 0.8–1.2)
Prothrombin Time: 28.7 seconds — ABNORMAL HIGH (ref 11.4–15.2)

## 2021-04-30 MED ORDER — WARFARIN SODIUM 2 MG PO TABS
4.0000 mg | ORAL_TABLET | Freq: Once | ORAL | Status: AC
  Administered 2021-04-30: 4 mg via ORAL
  Filled 2021-04-30: qty 2

## 2021-04-30 NOTE — Progress Notes (Signed)
Occupational Therapy Treatment Patient Details Name: Peter Beltran MRN: [id number] DOB: 1925-02-17 Today's Date: 04/30/2021    History of present illness Pt is a 87 y/o male presenting to [facility] ED 04/29/2021 after fall; workup for symptomatic bradycardia. CT head negative. No ischemic changes on EKG. PMH significant of AVR w/ metal stent, CAD, HTN, HLD, DM, OSA.   OT comments  Pt received 2nd session today, due to pt needing to go to the bathroom and attempting to exit the bed with no assist. Pt completed toileting, grooming, and hygiene in the bathroom and at the sink with min guard for safety. Pt then read the menu to the OT, and OT assisted pt with ordering dinner. Pt complete functional mobility with RW and min guard, agreeable to continueing therapies. Pt cognition still appearing impaired, however pt is pleasantly confused. Acute OT will continue to follow.    Follow Up Recommendations  Home health OT;Outpatient OT    Equipment Recommendations  None recommended by OT    Recommendations for Other Services      Precautions / Restrictions Precautions Precautions: Fall Restrictions Weight Bearing Restrictions: No       Mobility Bed Mobility               General bed mobility comments: Recieved sitting on EOB    Transfers Overall transfer level: Needs assistance Equipment used: Rolling walker (2 wheeled) Transfers: Sit to/from Stand Sit to Stand: Supervision         General transfer comment: Pt transferred from bed to bathroom on low toilet, then from bathroom to recliner, all with supervision for sit<>stand and min guard for mobility.    Balance Overall balance assessment: Needs assistance Sitting-balance support: Feet supported Sitting balance-Leahy Scale: Good Sitting balance - Comments: Able to don bilateral socks sitting EOB   Standing balance support: During functional activity;Bilateral upper extremity supported;No upper extremity supported Standing  balance-Leahy Scale: Fair Standing balance comment: Able to static stand without UE support; static and dynamic stability improved with at least single UE support                           ADL either performed or assessed with clinical judgement   ADL Overall ADL's : Needs assistance/impaired Eating/Feeding: Set up;Sitting   Grooming: Wash/dry hands;Min guard;Standing   Upper Body Bathing: Set up;Sitting   Lower Body Bathing: Minimal assistance;Sitting/lateral leans;Sit to/from stand   Upper Body Dressing : Set up;Sitting   Lower Body Dressing: Minimal assistance;Sitting/lateral leans;Sit to/from stand   Toilet Transfer: Min guard;Ambulation   Toileting- Clothing Manipulation and Hygiene: Min guard;Sit to/from stand;Sitting/lateral lean   Tub/ Shower Transfer: Min guard;Ambulation   Functional mobility during ADLs: Min guard;Rolling walker General ADL Comments: Pt completed toileting in bathroom and hygiene at the sink, all with min guard for safety.     Vision Baseline Vision/History: Wears glasses Wears Glasses: Reading only Patient Visual Report: No change from baseline Vision Assessment?: No apparent visual deficits   Perception Perception Perception Tested?: No   Praxis Praxis Praxis tested?: Not tested    Cognition Arousal/Alertness: Awake/alert Behavior During Therapy: WFL for tasks assessed/performed Overall Cognitive Status: Impaired/Different from baseline Area of Impairment: Orientation;Safety/judgement;Awareness                 Orientation Level: Disoriented to;Time Current Attention Level: Selective Memory: Decreased short-term memory Following Commands: Follows one step commands consistently;Follows multi-step commands inconsistently Safety/Judgement: Decreased awareness of safety;Decreased awareness of deficits  Awareness: Emergent Problem Solving: Slow processing General Comments: Pt attempting to get OOB on OT arrival, stating he  had to get dressed and catch a cab, despite bed alarms going off and pt being reminded earlier not to get OOB without assist.        Exercises     Shoulder Instructions       General Comments VSS on RA    Pertinent Vitals/ Pain       Pain Assessment: No/denies pain  Home Living Family/patient expects to be discharged to:: Assisted living                                 Additional Comments: Pt from [facility] with spouse.      Prior Functioning/Environment Level of Independence: Independent with assistive device(s)        Comments: Pt reports being independent with rollator. Pt reports performing ADLs independently, but states that he is incontinent.   Frequency  Min 2X/week        Progress Toward Goals  OT Goals(current goals can now be found in the care plan section)  Progress towards OT goals: OT to reassess next treatment  Acute Rehab OT Goals Patient Stated Goal: Get better and go home OT Goal Formulation: With patient/family Time For Goal Achievement: 05/14/21 Potential to Achieve Goals: Good ADL Goals Pt Will Perform Grooming: with modified independence;standing Pt Will Transfer to Toilet: with modified independence;ambulating Pt Will Perform Toileting - Clothing Manipulation and hygiene: with modified independence;sitting/lateral leans;sit to/from stand  Plan Discharge plan remains appropriate;Frequency remains appropriate    Co-evaluation                 AM-PAC OT "6 Clicks" Daily Activity     Outcome Measure   Help from another person eating meals?: A Little Help from another person taking care of personal grooming?: A Little Help from another person toileting, which includes using toliet, bedpan, or urinal?: A Little Help from another person bathing (including washing, rinsing, drying)?: A Little Help from another person to put on and taking off regular upper body clothing?: A Little Help from another person to put on  and taking off regular lower body clothing?: A Little 6 Click Score: 18    End of Session Equipment Utilized During Treatment: Rolling walker  OT Visit Diagnosis: Unsteadiness on feet (R26.81);Other abnormalities of gait and mobility (R26.89);Muscle weakness (generalized) (M62.81)   Activity Tolerance Patient tolerated treatment well   Patient Left in bed;with call bell/phone within reach;with family/visitor present   Nurse Communication Mobility status        Time: 4854-6270 OT Time Calculation (min): 18 min  Charges: OT General Charges $OT Visit: 1 Visit OT Evaluation $OT Eval Moderate Complexity: 1 Mod OT Treatments $Self Care/Home Management : 8-22 mins  Corene Resnick H., OTR/L Acute Rehabilitation   Natayah Warmack Elane Lillyian Heidt 04/30/2021, 5:06 PM

## 2021-04-30 NOTE — Progress Notes (Signed)
Patient transported back to his facility by [facility].  Pt's wife is aware that patient is on his way.  AVS and discharge instructions given to [facility].  IV discontinued with catheter tip intact.  External urinary catheter removed.  Pt's belongings sent with [facility].  [name]

## 2021-04-30 NOTE — Discharge Instructions (Signed)
Dr. Noah Delaine,  Thank you for letting me to care for you during your hospitalization.  You were initially admitted after being found down on the ground in your bedroom.  You are found to have a slow heartbeat during this time as well.  You were admitted to the hospital and had a thorough evaluation for this.  You are not found to have any stroke or brain bleed.  You had no signs or symptoms of infection or metabolic derangements.  Overall we suspect that the medications you are on for your anxiety and restless leg syndrome were contributing to your confusion.  Is also thought that on the day of your discharge some of your worsening confusion may be secondary to being out of your environment and being in the hospital.  We have discontinued your gabapentin, ropinirole, Klonopin.  Please talk with your doctor about restarting these.  Your metoprolol was also discontinued during your hospitalization and your heart rate improved.  Please continue all other medications.  Please follow-up with the doctor and your assisted living facility within the week.  Thank you for allowing me to care for you Dr. Johnney Ou

## 2021-04-30 NOTE — Evaluation (Signed)
Occupational Therapy Evaluation Patient Details Name: Peter Beltran MRN: [id number] DOB: 15-Feb-1925 Today's Date: 04/30/2021    History of Present Illness Pt is a 87 y/o male presenting to [facility] ED 04/29/2021 after fall; workup for symptomatic bradycardia. CT head negative. No ischemic changes on EKG. PMH significant of AVR w/ metal stent, CAD, HTN, HLD, DM, OSA.   Clinical Impression   Pt admitted to ED for concerns listed above. PTA pt reported that he was independent with all ADL's and his ALF provided his IADL's. Pt reports that he wants to remain independent for as long as possible. At the time of the evaluation, pt demonstrated increased confusion and weakness, requiring him to need min guard for all OOB activities and min A for all LB ADL's. Pt has decreased awareness of his safety and requires encouragement to remain safe. Pt expected to return to ALF, would benefit from therapy at facility when discharged to continue addressing safety and improving functional mobility/ADL performance. Acute OT will follow to address concerns listed below.     Follow Up Recommendations  Home health OT;Outpatient OT    Equipment Recommendations  None recommended by OT    Recommendations for Other Services       Precautions / Restrictions Precautions Precautions: Fall Restrictions Weight Bearing Restrictions: No      Mobility Bed Mobility               General bed mobility comments: Recieved sitting on EOB    Transfers Overall transfer level: Needs assistance Equipment used: Rolling walker (2 wheeled) Transfers: Sit to/from Stand Sit to Stand: Supervision         General transfer comment: Multiple sit<>stands from EOB, supervision for safety    Balance Overall balance assessment: Needs assistance Sitting-balance support: Feet supported Sitting balance-Leahy Scale: Good Sitting balance - Comments: Able to don bilateral socks sitting EOB   Standing balance support: During  functional activity;Bilateral upper extremity supported;No upper extremity supported Standing balance-Leahy Scale: Fair Standing balance comment: Able to static stand without UE support; static and dynamic stability improved with at least single UE support                           ADL either performed or assessed with clinical judgement   ADL Overall ADL's : Needs assistance/impaired Eating/Feeding: Set up;Sitting   Grooming: Set up;Standing   Upper Body Bathing: Set up;Sitting   Lower Body Bathing: Minimal assistance;Sitting/lateral leans;Sit to/from stand   Upper Body Dressing : Set up;Sitting   Lower Body Dressing: Minimal assistance;Sitting/lateral leans;Sit to/from stand   Toilet Transfer: Min guard;Ambulation   Toileting- Clothing Manipulation and Hygiene: Min guard;Sit to/from stand;Sitting/lateral lean   Tub/ Shower Transfer: Min guard;Ambulation   Functional mobility during ADLs: Min guard;Rolling walker General ADL Comments: Pt requires min A for LB ADL's for safety and difficulty with reaching his feet. All OOB activities require min guard for safety     Vision Baseline Vision/History: Wears glasses Wears Glasses: Reading only Patient Visual Report: No change from baseline Vision Assessment?: No apparent visual deficits     Perception Perception Perception Tested?: No   Praxis Praxis Praxis tested?: Not tested    Pertinent Vitals/Pain Pain Assessment: No/denies pain     Hand Dominance Right   Extremity/Trunk Assessment Upper Extremity Assessment Upper Extremity Assessment: Overall WFL for tasks assessed   Lower Extremity Assessment Lower Extremity Assessment: Defer to PT evaluation   Cervical / Trunk Assessment Cervical /  Trunk Assessment: Normal   Communication Communication Communication: No difficulties   Cognition Arousal/Alertness: Awake/alert Behavior During Therapy: WFL for tasks assessed/performed Overall Cognitive Status:  Impaired/Different from baseline Area of Impairment: Orientation;Safety/judgement;Awareness;Attention;Memory;Following commands                 Orientation Level: Disoriented to;Time Current Attention Level: Selective Memory: Decreased short-term memory Following Commands: Follows one step commands consistently;Follows multi-step commands inconsistently Safety/Judgement: Decreased awareness of safety;Decreased awareness of deficits Awareness: Emergent Problem Solving: Slow processing General Comments: Improving awareness and orientation; still demonstrates poor attention, but able to redirect well   General Comments  Pt's daughter present, very supportive    Exercises     Shoulder Instructions      Home Living Family/patient expects to be discharged to:: Assisted living                                 Additional Comments: Pt from [facility] with spouse.      Prior Functioning/Environment Level of Independence: Independent with assistive device(s)        Comments: Pt reports being independent with rollator. Pt reports performing ADLs independently, but states that he is incontinent.        OT Problem List: Decreased strength;Decreased activity tolerance;Impaired balance (sitting and/or standing);Decreased coordination;Decreased safety awareness;Decreased cognition;Decreased knowledge of use of DME or AE      OT Treatment/Interventions: Self-care/ADL training;Therapeutic exercise;Energy conservation;DME and/or AE instruction;Therapeutic activities;Patient/family education;Balance training    OT Goals(Current goals can be found in the care plan section) Acute Rehab OT Goals Patient Stated Goal: Get better and go home OT Goal Formulation: With patient/family Time For Goal Achievement: 05/14/21 Potential to Achieve Goals: Good ADL Goals Pt Will Perform Grooming: with modified independence;standing Pt Will Transfer to Toilet: with modified  independence;ambulating Pt Will Perform Toileting - Clothing Manipulation and hygiene: with modified independence;sitting/lateral leans;sit to/from stand  OT Frequency: Min 2X/week   Barriers to D/C:            Co-evaluation              AM-PAC OT "6 Clicks" Daily Activity     Outcome Measure Help from another person eating meals?: A Little Help from another person taking care of personal grooming?: A Little Help from another person toileting, which includes using toliet, bedpan, or urinal?: A Little Help from another person bathing (including washing, rinsing, drying)?: A Little Help from another person to put on and taking off regular upper body clothing?: A Little Help from another person to put on and taking off regular lower body clothing?: A Little 6 Click Score: 18   End of Session Equipment Utilized During Treatment: Rolling walker Nurse Communication: Mobility status  Activity Tolerance: Patient tolerated treatment well Patient left: in bed;with call bell/phone within reach;with family/visitor present  OT Visit Diagnosis: Unsteadiness on feet (R26.81);Other abnormalities of gait and mobility (R26.89);Muscle weakness (generalized) (M62.81)                Time: 4196-2229 OT Time Calculation (min): 35 min Charges:  OT General Charges $OT Visit: 1 Visit OT Evaluation $OT Eval Moderate Complexity: 1 Mod OT Treatments $Self Care/Home Management : 8-22 mins Fatimah Sundquist H., OTR/L Acute Rehabilitation Kahlani Graber Elane Ewell Benassi 04/30/2021, 3:21 PM

## 2021-04-30 NOTE — Progress Notes (Signed)
Progress Note  Patient Name: Peter Beltran Date of Encounter: 04/30/2021  [facility] Cardiologist: Dr. Geraldo Pitter  Subjective   No acute events overnight. Daughter present for interview today. We reviewed his telemetry; discussed that there were no indications for pacemaker. He is somewhat surprised by this as he thought he was in the hospital for a heart surgery. He does not recall speaking with me yesterday, reviewed prior discussion. No chest pain or shortness of breath.  Inpatient Medications    Scheduled Meds:  allopurinol  300 mg Oral Daily   fluticasone furoate-vilanterol  1 puff Inhalation Daily   latanoprost  1 drop Both Eyes QHS   levothyroxine  112 mcg Oral Once per day on Sun Mon Tue Wed Thu Fri   And   [START ON 05/02/2021] levothyroxine  200 mcg Oral Once per day on Sat   timolol  1 drop Both Eyes BID   Warfarin - Pharmacist Dosing Inpatient   Does not apply q1600   Continuous Infusions:  PRN Meds: acetaminophen **OR** acetaminophen, albuterol, polyethylene glycol   Vital Signs    Vitals:   04/29/21 2039 04/30/21 0411 04/30/21 0610 04/30/21 0802  BP: (!) 160/63 (!) 189/73    Pulse: 71 64    Resp: 20 16    Temp: 98.3 F (36.8 C) 98.7 F (37.1 C)    TempSrc: Oral Oral    SpO2: 94% 97%  97%  Weight:   92.9 kg   Height:        Intake/Output Summary (Last 24 hours) at 04/30/2021 1028 Last data filed at 04/30/2021 1610 Gross per 24 hour  Intake --  Output 2300 ml  Net -2300 ml   Last 3 Weights 04/30/2021 04/29/2021 04/15/2021  Weight (lbs) 204 lb 12.9 oz 208 lb 6.4 oz 216 lb  Weight (kg) 92.9 kg 94.53 kg 97.977 kg      Telemetry    Sinus rhythm in the 60s predominantly, occasional atrial and ventricular ectopy - Personally Reviewed  ECG    6/16/ NSR, RBBB, 60 bpm - Personally Reviewed  Physical Exam   GEN: No acute distress.   Neck: No JVD Cardiac: RRR, no murmurs, rubs, or gallops. Crisp mechanical S2 Respiratory: Clear to auscultation  bilaterally. GI: Soft, nontender, non-distended  MS: No edema; No deformity. Neuro:  Nonfocal  Psych: Normal affect   Labs    High Sensitivity Troponin:   Recent Labs  Lab 04/29/21 0511 04/29/21 0758  TROPONINIHS 8 8      Chemistry Recent Labs  Lab 04/29/21 0355 04/30/21 0208  NA 133* 134*  K 4.3 4.2  CL 100 101  CO2 24 24  GLUCOSE 125* 118*  BUN 23 23  CREATININE 1.24 0.98  CALCIUM 9.4 9.6  PROT  --  7.2  ALBUMIN  --  3.6  AST  --  30  ALT  --  17  ALKPHOS  --  46  BILITOT  --  0.8  GFRNONAA 54* >60  ANIONGAP 9 9     Hematology Recent Labs  Lab 04/29/21 0355 04/30/21 0208  WBC 5.2 6.3  RBC 3.26* 3.62*  HGB 10.6* 11.9*  HCT 32.3* 34.7*  MCV 99.1 95.9  MCH 32.5 32.9  MCHC 32.8 34.3  RDW 15.2 14.8  PLT 149* 170    BNPNo results for input(s): BNP, PROBNP in the last 168 hours.   DDimer No results for input(s): DDIMER in the last 168 hours.   Radiology    CT Head  Wo Contrast  Result Date: 04/29/2021 CLINICAL DATA:  87 year old male found on the floor next to the bed, altered mental status, bradycardia. EXAM: CT HEAD WITHOUT CONTRAST TECHNIQUE: Contiguous axial images were obtained from the base of the skull through the vertex without intravenous contrast. COMPARISON:  Brain MRI 10/09/2016.  Head CT 10/27/2020. FINDINGS: Brain: No midline shift, ventriculomegaly, mass effect, evidence of mass lesion, intracranial hemorrhage or evidence of cortically based acute infarction. Chronically advanced bilateral white matter hypodensity and heterogeneity in the left thalamus appears stable and compatible with chronic small vessel disease. There are small chronic infarcts in the bilateral cerebellum, most notably the right SCA territory. Vascular: Calcified atherosclerosis at the skull base. No suspicious intracranial vascular hyperdensity. Skull: No acute osseous abnormality identified. Chronic degenerative changes about the odontoid with subchondral cysts.  Sinuses/Orbits: Chronic bilateral paranasal sinus disease is stable since December, aside from increased bubbly opacity in the right sphenoid now. Tympanic cavities and mastoids remain well aerated. Other: Calcified scalp vessel atherosclerosis. No acute orbit or scalp soft tissue finding. IMPRESSION: 1. No acute intracranial abnormality. 2. Advanced chronic small vessel ischemia appears stable since December. 3. Chronic paranasal sinus disease. Electronically Signed   By: Genevie Ann M.D.   On: 04/29/2021 04:50   CT Cervical Spine Wo Contrast  Result Date: 04/29/2021 CLINICAL DATA:  87 year old male found on the floor next to the bed, altered mental status, bradycardia. EXAM: CT CERVICAL SPINE WITHOUT CONTRAST TECHNIQUE: Multidetector CT imaging of the cervical spine was performed without intravenous contrast. Multiplanar CT image reconstructions were also generated. COMPARISON:  [facility] Cervical spine CT 11/14/2020. FINDINGS: Alignment: Stable since December. Mild degenerative anterolisthesis of C5 on C6, with straightening of upper cervical lordosis. Skull base and vertebrae: Osteopenia. Visualized skull base is intact. No atlanto-occipital dissociation. No acute osseous abnormality identified. Soft tissues and spinal canal: No prevertebral fluid or swelling. No visible canal hematoma. Bulky calcified cervical carotid atherosclerosis. Disc levels: Bulky degenerative ligamentous hypertrophy about the odontoid which demonstrates multiple degenerative subchondral cysts. C3-C4 ankylosis is chronic. Severe chronic disc and endplate degeneration at C4-C5. Degenerative anterolisthesis with disc, endplate, and posterior element degeneration at C5-C6. But only mild degenerative cervical spinal stenosis is suspected and appears stable. Upper chest: Calcified aortic atherosclerosis. Tortuous proximal great vessels. Grossly intact visible upper thoracic levels. IMPRESSION: 1. No  acute traumatic injury identified in the cervical spine. 2. Stable advanced cervical spine degeneration. 3. Aortic Atherosclerosis (ICD10-I70.0). Electronically Signed   By: Genevie Ann M.D.   On: 04/29/2021 04:53   DG Chest Portable 1 View  Result Date: 04/29/2021 CLINICAL DATA:  87 year old male status post fall. Altered mental status. Bradycardia. EXAM: PORTABLE CHEST 1 VIEW COMPARISON:  Chest radiographs 07/19/2019. FINDINGS: Portable AP view at 0410 hours. Stable sequelae of CABG and mediastinal contours. Visualized tracheal air column is within normal limits. Mildly lower lung volumes. But when Allowing for portable technique the lungs are clear. No pneumothorax or pleural effusion. Paucity of bowel gas in the upper abdomen. No acute osseous abnormality identified. Partially visible chronic right shoulder arthroplasty. IMPRESSION: Lower lung volumes.  No acute cardiopulmonary abnormality. Electronically Signed   By: Genevie Ann M.D.   On: 04/29/2021 04:17    Cardiac Studies   Echo 01/05/21 1. Left ventricular ejection fraction, by estimation, is 55 to 60%. The  left ventricle has normal function. The left ventricle has no regional  wall motion abnormalities. Left ventricular diastolic function could not  be  evaluated.   2. Right ventricular systolic function is normal. The right ventricular  size is normal. There is normal pulmonary artery systolic pressure.   3. Left atrial size was moderately dilated.   4. The mitral valve is normal in structure. Moderate mitral valve  regurgitation. Mild mitral stenosis.   5. Satisfactory function of the aortic valve.. The aortic valve has been  repaired/replaced. Aortic valve regurgitation is not visualized. No aortic  stenosis is present. There is a [employer] mechanical valve present in the  aortic position. Procedure Date:   2012.   6. The inferior vena cava is normal in size with greater than 50%  respiratory variability, suggesting right atrial pressure  of 3 mmHg  Patient Profile     87 y.o. male with a hx of CAD, hypertension, mechanical AVR on coumadin, mixed hyperlipidemia who is being seen 04/29/2021 for the evaluation of bradycardia at the request of Dr. Evette Doffing.  Assessment & Plan    Bradycardia -heart rates rising with holding metoprolol succinate 50 mg BID. Has occasional PACs/PVCs on telemetry. No significant pauses -prior heart rates (presumed on metoprolol) were in the 50s -do no suspect that bradycardia triggered his event. No indication for a pacemaker. This was discussed several times this AM with patient and his daughter -with occasional ectopy, ok to continue to hold metoprolol. If ectopy increases, would start back low dose metoprolol succinate 25 mg daily (was on 50 mg BID prior to admission, per chart)  Reported chest pain: he cannot describe these events to me. Denies any while admitted -hsTnI normal/flat, reassuring. No inpatient workup needed at this time.   Mechanical AVR -valve is crisp on exam -reviewed prior echo -coumadin per pharmacy  [place] will sign off.   Medication Recommendations:  would hold metoprolol succinate at discharge. If ectopy increases as metoprolol washes out, can restart very low dose metoprolol succinate 25 mg daily and monitor Other recommendations (labs, testing, etc):  none Follow up as an outpatient:  We will arrange for outpatient follow up with Dr. Geraldo Pitter  For questions or updates, please contact [facility] Please consult [email or web address] for contact info under        Signed, Buford Dresser, MD  04/30/2021, 10:28 AM

## 2021-04-30 NOTE — Care Management Obs Status (Signed)
[place] NOTIFICATION   Patient Details  Name: Peter Beltran MRN: [id number] Date of Birth: 08/18/1925   Medicare Observation Status Notification Given:  Yes    Bethena Roys, RN 04/30/2021, 3:43 PM

## 2021-04-30 NOTE — NC FL2 (Addendum)
[place] LEVEL OF CARE SCREENING TOOL     IDENTIFICATION  Patient Name: Peter Beltran Birthdate: [birth year] Sex: male Admission Date (Current Location): 04/29/2021  [facility] and [place] Number:  Herbalist and Address:  [facility], [place] [address] [place], [place] [address]      Provider Number: [id number]  Attending Physician Name and Address:  Axel Filler, *  Relative Name and Phone Number:  Silva Bandy [phone]    Current Level of Care: Hospital Recommended Level of Care: [place] [place] ([facility]) Prior Approval Number:    Date Approved/Denied:   PASRR Number:    Discharge Plan: Other (Comment) ([facility])    Current Diagnoses: Patient Active Problem List   Diagnosis Date Noted   Bradycardia with less than 60 beats per minute 04/29/2021   Gout    Hyperplasia of prostate    Hypothyroidism    Myocardial infarct [facility])    Cognitive communication deficit 10/28/2020   Disorders of muscle in diseases classified elsewhere, multiple sites 10/28/2020   History of falling 10/28/2020   Muscle weakness (generalized) 10/28/2020   Oral mucositis (ulcerative), unspecified 10/28/2020   Other specified disorders of nose and nasal sinuses 10/28/2020   Traumatic subarachnoid hemorrhage without loss of consciousness, subsequent encounter 10/28/2020   Aortic stenosis    Hypertension    Status post shoulder joint replacement    Chronic rhinitis 12/27/2019   Old myocardial infarction 08/22/2019   Other malaise 07/25/2019   Cellulitis, unspecified 07/25/2019   Disorder of the skin and subcutaneous tissue, unspecified 07/25/2019   Encounter for immunization 07/25/2019   Localized edema 07/25/2019   Acute kidney failure, unspecified ([place]) 07/22/2019   Status post aortic valve replacement with metallic valve 56/21/3086   Essential hypertension 01/19/2019   Mixed dyslipidemia 01/19/2019    Follow-up visit for aortic valve replacement with metallic valve 57/84/6962   Overweight 01/19/2019   Unspecified abnormalities of gait and mobility 10/18/2018   Presence of prosthetic heart valve 10/18/2018   Presence of aortocoronary bypass graft 10/18/2018   Pain in thoracic spine 10/18/2018   Generalized edema 10/18/2018   Hypertensive heart disease with heart failure ([place]) 10/18/2018   Dry mouth, unspecified 10/18/2018   Urinary retention 07/18/2018   Unspecified urinary incontinence 05/17/2018   Nasal congestion 02/04/2018   Allergic rhinitis, unspecified 02/04/2018   Vitamin D deficiency, unspecified 08/16/2017   Type 2 diabetes mellitus with diabetic neuropathy, unspecified ([place]) 08/16/2017   Rash and other nonspecific skin eruption 08/16/2017   Low back pain 08/16/2017   Long term (current) use of aspirin 08/16/2017   Iron deficiency anemia, unspecified 08/16/2017   Impacted cerumen, bilateral 08/16/2017   Acute maxillary sinusitis, unspecified 08/16/2017   Candidal stomatitis 08/16/2017   Constipation, unspecified 08/16/2017   Atherosclerotic heart disease of native coronary artery without angina pectoris 08/16/2017   Benign prostatic hyperplasia with lower urinary tract symptoms 08/16/2017   Constipation 08/16/2017   Monitoring for long-term anticoagulant use 08/03/2017   Unspecified atrial fibrillation ([place]) 03/30/2017   Hyperlipidemia, unspecified 11/11/2016   Hypertriglyceridemia 11/11/2016   Cough 10/20/2016   Diabetic polyneuropathy associated with type 2 diabetes mellitus ([place]) 10/04/2016   Mild cognitive impairment 08/30/2016   Pain in the chest    Chest pain 04/10/2016   Coronary artery disease 04/10/2016   Hypothyroidism, unspecified 04/10/2016   Dyslipidemia 04/10/2016   Subtherapeutic anticoagulation 04/10/2016   Chronic coronary artery disease 04/10/2016   Memory loss, short term 03/30/2016  Foreign body aspiration 03/05/2016   Moderate persistent  asthma, uncomplicated 00/93/8182   Moderate persistent asthma without complication 99/37/1696   Ischemic optic neuropathy of both eyes 01/28/2016   Low-tension glaucoma, bilateral, 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/2016   Low-tension glaucoma of both eyes, 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/2016   Spinal stenosis, lumbar region without neurogenic claudication 04/30/2015   Lumbar stenosis 04/30/2015   Hidrocystoma of left eyelid 12/10/2014   Pseudophakia of both eyes 12/10/2014   Neuropathy 04/04/2014   Gastro-esophageal reflux disease without esophagitis 02/26/2014   Esophageal reflux 02/26/2014   Psoriasis and similar disorder 01/28/2014   Obstructive sleep apnea 12/17/2013   Obstructive sleep apnea of adult 12/17/2013   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/28/2013   Anxiety disorder, unspecified 09/17/2013   Anxiety state 09/17/2013   Restless legs syndrome 08/26/2013   Spells 08/26/2013   Periodic limb movements of sleep 08/26/2013   Hypersomnia 06/14/2013   Syncope and collapse 06/14/2013   Aortic valve disorder 05/29/2013   Enlarged prostate with lower urinary tract symptoms (LUTS) 05/29/2013   Gouty arthropathy 05/29/2013   Long term (current) use of anticoagulants 05/29/2013   Encounter for current long-term use of anticoagulants 05/29/2013   Chronic airway obstruction (HCC) 04/21/2013   Disturbance of salivary secretion 04/21/2013   Dysphagia 04/21/2013   Impaired fasting glucose 04/21/2013   Unspecified osteoarthritis, unspecified site 04/21/2013   Osteoarthrosis 04/21/2013    Orientation RESPIRATION BLADDER Height & Weight     Self, Time, Situation, Place (WDL)  Normal Incontinent, External catheter (External Urinary Catheter) Weight: 204 lb 12.9 oz (92.9 kg) Height:  5\' 8"  (172.7 cm)  BEHAVIORAL SYMPTOMS/MOOD NEUROLOGICAL BOWEL NUTRITION STATUS      Continent (WDL) DIET: Low Sodium Heart Healthy  AMBULATORY STATUS COMMUNICATION OF NEEDS Skin   Limited Assist Verbally Other (Comment) (Abrasion  Leg,bilateral,lower,anterior,non-tenting)                       Personal Care Assistance Level of Assistance  Bathing, Feeding, Dressing Bathing Assistance: Limited assistance Feeding assistance: Independent Dressing Assistance: Limited assistance     Functional Limitations Info  Sight, Hearing, Speech Sight Info:  (wears reading glasses) Hearing Info:  (mildly impaired) Speech Info: Adequate    SPECIAL CARE FACTORS FREQUENCY  PT (By licensed PT), OT (By licensed OT) HH orders      PT Frequency: 3x min weekly OT Frequency: 3x min weekly            Contractures Contractures Info: Not present    Additional Factors Info  Code Status, Allergies Code Status Info: DNR Allergies Info: Atorvastatin Calcium,Dorzolamide,Brimonidine,Brinzolamide-brimonidine,Lipitor (atorvastatin Calcium),Rotigotine,Tamsulosin,Vancomycin,Zolpidem,Dutasteride           TAKE these medications     acetaminophen 650 MG CR tablet Commonly known as: TYLENOL Take 650 mg by mouth in the morning and at bedtime.    albuterol 108 (90 Base) MCG/ACT inhaler Commonly known as: VENTOLIN HFA Inhale 2 puffs into the lungs every 6 (six) hours as needed for wheezing or shortness of breath.    allopurinol 300 MG tablet Commonly known as: ZYLOPRIM Take 300 mg by mouth daily.    antiseptic oral rinse Liqd 30 mLs by Mouth Rinse route in the morning and at bedtime.    DSS 100 MG Caps Take 100 mg by mouth as needed for constipation.    ergocalciferol 1.25 MG (50000 UT) capsule Commonly known as: VITAMIN D2 Take 50,000 Units by mouth every Tuesday.    fenofibrate 145 MG tablet Commonly known as:  TRICOR Take 145 mg by mouth daily.    Fluticasone-Salmeterol 100-50 MCG/DOSE Aepb Commonly known as: ADVAIR Inhale 2 puffs into the lungs 2 (two) times daily.    icosapent Ethyl 1 g capsule Commonly known as: Vascepa Take 2 capsules (2 g total) by mouth 2 (two) times daily.    latanoprost 0.005 %  ophthalmic solution Commonly known as: XALATAN Place 1 drop into both eyes at bedtime.    levothyroxine 200 MCG tablet Commonly known as: SYNTHROID Take 200 mcg by mouth every 7 (seven) days.    levothyroxine 112 MCG tablet Commonly known as: SYNTHROID Take 112 mcg by mouth daily before breakfast. 6 days a week    magic mouthwash (nystatin, hydrocortisone, diphenhydrAMINE) suspension Swish and spit 5 mLs 4 (four) times daily as needed for mouth pain.    nitroGLYCERIN 0.4 MG SL tablet Commonly known as: NITROSTAT Place 1 tablet (0.4 mg total) under the tongue every 5 (five) minutes as needed for chest pain.    omeprazole 20 MG capsule Commonly known as: PRILOSEC Take 20 mg by mouth 2 (two) times daily before a meal.    predniSONE 20 MG tablet Commonly known as: DELTASONE Take 20 mg by mouth daily as needed (acute pain).    Slow Fe 142 (45 Fe) MG Tbcr Generic drug: Ferrous Sulfate Take 45 mg by mouth daily.    sodium chloride 0.65 % Soln nasal spray Commonly known as: OCEAN Place 1 spray into both nostrils as needed for congestion.    spironolactone 25 MG tablet Commonly known as: ALDACTONE Take 25 mg by mouth daily.    timolol 0.5 % ophthalmic solution Commonly known as: TIMOPTIC Place 1 drop into both eyes 2 (two) times daily.    warfarin 4 MG tablet Commonly known as: COUMADIN Take 4 mg by mouth as directed. 5 times weekly    warfarin 3 MG tablet Commonly known as: COUMADIN Take 3 mg by mouth 2 (two) times a week.          Relevant Imaging Results:  Relevant Lab Results:   Additional Information [id number]  Trula Ore, LCSWA

## 2021-04-30 NOTE — Progress Notes (Signed)
Physical Therapy Treatment Patient Details Name: Peter Beltran MRN: [id number] DOB: 1925/07/25 Today's Date: 04/30/2021    History of Present Illness Pt is a 87 y/o male presenting to [facility] ED 04/29/2021 after fall; workup for symptomatic bradycardia. CT head negative. No ischemic changes on EKG. PMH significant of AVR w/ metal stent, CAD, HTN, HLD, DM, OSA.   PT Comments    Pt progressing well with mobility. Demonstrates improved activity tolerance and stability with therex and ambulation using rollator at supervision-level. Pt appears to have improving orientation and awareness this morning, but still with decreased attention and talking about unrelated topics. Will continue to follow acutely to address established goals.    Follow Up Recommendations  Home health PT     Equipment Recommendations  None recommended by PT    Recommendations for Other Services       Precautions / Restrictions Precautions Precautions: Fall Restrictions Weight Bearing Restrictions: No    Mobility  Bed Mobility Overal bed mobility: Needs Assistance Bed Mobility: Supine to Sit     Supine to sit: Supervision     General bed mobility comments: supervision for lines, cues to stay on task    Transfers Overall transfer level: Needs assistance Equipment used: 4-wheeled walker;None Transfers: Sit to/from Stand Sit to Stand: Supervision         General transfer comment: Multiple sit<>stands from EOB and recliner with and without rollator, supervision for safety  Ambulation/Gait Ambulation/Gait assistance: Supervision Gait Distance (Feet): 550 Feet Assistive device: 4-wheeled walker Gait Pattern/deviations: Step-through pattern;Decreased stride length;Trunk flexed     General Gait Details: Steady ambulation with rollator and supervision for safety, intermittent cues to maintain closer proximity to rollator to allow for more upright posture; pt did not require rest breaks, no significant  DOE noted   Marine scientist [place] (Stroke Patients Only)       Balance Overall balance assessment: Needs assistance Sitting-balance support: Feet supported Sitting balance-Leahy Scale: Good Sitting balance - Comments: Able to don bilateral socks sitting EOB   Standing balance support: During functional activity;Bilateral upper extremity supported;No upper extremity supported Standing balance-Leahy Scale: Fair Standing balance comment: Able to static stand without UE support; static and dynamic stability improved with at least single UE support                            Cognition Arousal/Alertness: Awake/alert Behavior During Therapy: WFL for tasks assessed/performed Overall Cognitive Status: Impaired/Different from baseline Area of Impairment: Orientation;Safety/judgement;Awareness;Attention;Memory;Following commands                   Current Attention Level: Selective Memory: Decreased short-term memory Following Commands: Follows one step commands consistently;Follows multi-step commands inconsistently Safety/Judgement: Decreased awareness of safety;Decreased awareness of deficits Awareness: Emergent   General Comments: Improving awareness and orientation; still demonstrates poor attention, but able to redirect well      Exercises Other Exercises Other Exercises: 12x repeated sit<>stand from recliner (reliant on UE support; 2x seated rest)    General Comments General comments (skin integrity, edema, etc.): Pt's daughter Peter Beltran) present and suportive. SpO2 97% on RA, HR up to 70s      Pertinent Vitals/Pain Pain Assessment: No/denies pain    Home Living                      Prior Function  PT Goals (current goals can now be found in the care plan section) Progress towards PT goals: Progressing toward goals    Frequency    Min 3X/week      PT Plan Current plan remains  appropriate    Co-evaluation              AM-PAC PT "6 Clicks" Mobility   Outcome Measure  Help needed turning from your back to your side while in a flat bed without using bedrails?: None Help needed moving from lying on your back to sitting on the side of a flat bed without using bedrails?: A Little Help needed moving to and from a bed to a chair (including a wheelchair)?: A Little Help needed standing up from a chair using your arms (e.g., wheelchair or bedside chair)?: A Little Help needed to walk in hospital room?: A Little Help needed climbing 3-5 steps with a railing? : A Little 6 Click Score: 19    End of Session Equipment Utilized During Treatment: Gait belt Activity Tolerance: Patient tolerated treatment well Patient left: in chair;with call bell/phone within reach;with family/visitor present (chair alarm under pt but alarm box malfunction) Nurse Communication: Mobility status;Other (comment) (chair alarm box not working) PT Visit Diagnosis: Other abnormalities of gait and mobility (R26.89);History of falling (Z91.81);Muscle weakness (generalized) (M62.81);Unsteadiness on feet (R26.81)     Time: 6283-1517 PT Time Calculation (min) (ACUTE ONLY): 25 min  Charges:  $Gait Training: 8-22 mins $Therapeutic Exercise: 8-22 mins                     Peter Beltran, PT, DPT Acute Rehabilitation Services  Pager [phone] Office [place] 04/30/2021, 9:54 AM

## 2021-04-30 NOTE — TOC Transition Note (Signed)
Transition of Care [facility]) - CM/SW Discharge Note   Patient Details  Name: Peter Beltran MRN: [id number] Date of Birth: July 26, 1925  Transition [facility]) CM/SW Contact:  Trula Ore, [place] Phone Number: 04/30/2021, 3:09 PM   Clinical Narrative:     Patient will DC to: [facility]   Anticipated DC date: 04/30/2021  Family notified: Claris Pong   Transport by: Corey Harold  ?  Per MD patient ready for DC to [facility] . RN, patient, patient's family, and facility notified of DC. Discharge Summary,FL2,Covid result, and Ramona orders sent to facility. RN given number for report tele#[phone]. DC packet on chart. DNR signed by MD attached to patients dc packet.Ambulance transport requested for patient.  CSW signing off.   Final next level of care: Assisted Living ([facility]) Barriers to Discharge: No Barriers Identified   Patient Goals and CMS Choice   CMS Medicare.gov Compare Post Acute Care list provided to:: Patient Represenative (must comment) (daughter Claris Pong) Choice offered to / list presented to : Adult Children (daughter [place])  Discharge Placement              Patient chooses bed at:  ([facility]) Patient to be transferred to facility by: [facility] Name of family member notified: Alexandra Patient and family notified of of transfer: 04/30/21  Discharge Plan and Services                                     Social Determinants of Health (SDOH) Interventions     Readmission Risk Interventions No flowsheet data found.

## 2021-04-30 NOTE — Discharge Summary (Addendum)
Name: Peter Beltran MRN: [id number] DOB: 1925/01/26 87 y.o. PCP: Javier Glazier, MD  Date of Admission: 04/29/2021  3:45 AM Date of Discharge: 04/30/21  Attending Physician: Dr. Evette Doffing  Discharge Diagnosis: Principal Problem:   Fall Active Problems:   Diabetic polyneuropathy associated with type 2 diabetes mellitus (HCC)   Mild cognitive impairment   Periodic limb movements of sleep   Bradycardia with less than 60 beats per minute    Discharge Medications: Allergies as of 04/30/2021       Reactions   Atorvastatin Calcium Other (See Comments)   Developed myopathy. Developed myopathy.   Dorzolamide Other (See Comments)   Follicula Follicula   Brimonidine Other (See Comments)   Red and burning eyes   Brinzolamide-brimonidine    Redness in eyes - reaction to [place] [atorvastatin Calcium]    Developed myopathy.   Rotigotine Other (See Comments)   Falling asleep uncontrollably - reaction to Neupro   Tamsulosin Other (See Comments)   Unknown reaction (Flomax)   Vancomycin    Red Rash   Zolpidem Other (See Comments)   Pt. states that he possibly had a neuro reaction.   Dutasteride Rash   Reaction to Avodart        Medication List     STOP taking these medications    clonazePAM 1 MG tablet Commonly known as: KLONOPIN   gabapentin 100 MG capsule Commonly known as: NEURONTIN   gabapentin 300 MG capsule Commonly known as: NEURONTIN   loratadine 10 MG tablet Commonly known as: CLARITIN   metoprolol succinate 50 MG 24 hr tablet Commonly known as: TOPROL-XL   rOPINIRole 0.5 MG tablet Commonly known as: REQUIP   rOPINIRole 2 MG tablet Commonly known as: REQUIP       TAKE these medications    acetaminophen 650 MG CR tablet Commonly known as: TYLENOL Take 650 mg by mouth in the morning and at bedtime.   albuterol 108 (90 Base) MCG/ACT inhaler Commonly known as: VENTOLIN HFA Inhale 2 puffs into the lungs every 6 (six) hours as  needed for wheezing or shortness of breath.   allopurinol 300 MG tablet Commonly known as: ZYLOPRIM Take 300 mg by mouth daily.   antiseptic oral rinse Liqd 30 mLs by Mouth Rinse route in the morning and at bedtime.   DSS 100 MG Caps Take 100 mg by mouth as needed for constipation.   ergocalciferol 1.25 MG (50000 UT) capsule Commonly known as: VITAMIN D2 Take 50,000 Units by mouth every Tuesday.   fenofibrate 145 MG tablet Commonly known as: TRICOR Take 145 mg by mouth daily.   Fluticasone-Salmeterol 100-50 MCG/DOSE Aepb Commonly known as: ADVAIR Inhale 2 puffs into the lungs 2 (two) times daily.   icosapent Ethyl 1 g capsule Commonly known as: Vascepa Take 2 capsules (2 g total) by mouth 2 (two) times daily.   latanoprost 0.005 % ophthalmic solution Commonly known as: XALATAN Place 1 drop into both eyes at bedtime.   levothyroxine 200 MCG tablet Commonly known as: SYNTHROID Take 200 mcg by mouth every 7 (seven) days.   levothyroxine 112 MCG tablet Commonly known as: SYNTHROID Take 112 mcg by mouth daily before breakfast. 6 days a week   magic mouthwash (nystatin, hydrocortisone, diphenhydrAMINE) suspension Swish and spit 5 mLs 4 (four) times daily as needed for mouth pain.   nitroGLYCERIN 0.4 MG SL tablet Commonly known as: NITROSTAT Place 1 tablet (0.4 mg total) under the tongue every 5 (five) minutes as  needed for chest pain.   omeprazole 20 MG capsule Commonly known as: PRILOSEC Take 20 mg by mouth 2 (two) times daily before a meal.   predniSONE 20 MG tablet Commonly known as: DELTASONE Take 20 mg by mouth daily as needed (acute pain).   Slow Fe 142 (45 Fe) MG Tbcr Generic drug: Ferrous Sulfate Take 45 mg by mouth daily.   sodium chloride 0.65 % Soln nasal spray Commonly known as: OCEAN Place 1 spray into both nostrils as needed for congestion.   spironolactone 25 MG tablet Commonly known as: ALDACTONE Take 25 mg by mouth daily.   timolol 0.5 %  ophthalmic solution Commonly known as: TIMOPTIC Place 1 drop into both eyes 2 (two) times daily.   warfarin 4 MG tablet Commonly known as: COUMADIN Take 4 mg by mouth as directed. 5 times weekly   warfarin 3 MG tablet Commonly known as: COUMADIN Take 3 mg by mouth 2 (two) times a week.        Disposition and follow-up:   Mr.Jsean F Ramthun was discharged from [facility] in Stable condition.  At the hospital follow up visit please address:  1.  Follow-up:  a.  Fall, confusion-suspect secondary to multiple centrally acting medications    b.  Bradycardia-follow-up with your cardiologist, 06/02/2021.  Hold metoprolol   c.  Restless leg syndrome-discontinued central acting medications  2.  Labs / imaging needed at time of follow-up: None  3.  Pending labs/ test needing follow-up: none  4.  Medication Changes  Started: None  Stopped: Klonopin, gabapentin, ropinirole, loratadine, metoprolol  Changed: None  Abx -none   Follow-up Appointments:  Follow-up Information     Revankar, Reita Cliche, MD Follow up on 06/02/2021.   Specialty: Cardiology Why: Please arrive 15 minutes early for your 11:20am post-hospital cardiology appointment Contact information: [address]  [place] [address] [phone]         Javier Glazier, MD. Schedule an appointment as soon as possible for a visit in 1 week(s).   Specialty: Internal Medicine Why: Please follow-up in 1 week Contact information: [address] [place] [address] [phone]                 Hospital Course by problem list:  Altered mental status Fall Mr. [name] was found on the ground at 4 AM.  The patient was somnolent and difficult to arouse.  He was brought into the emergency department at [facility] found to be confused.  The patient's wife noted that prior to this event occurring the patient was with worsening confusion that evening.  With his history of  subarachnoid hemorrhages as well as hygroma, the patient had a head CT that was negative for any acute hemorrhage.  During the patient's hospitalization he continued to be confused suspecting that he was here for cardiac surgery.  His work-up was negative for infectious etiology or metabolic derangements.  His urinalysis was negative as well.  While it was not known how long the patient was down on the ground, his urine was without hemoglobin.  And his kidney function was stable.  His B12, folate, and magnesium were all normal.  His TSH was also normal.  Patient is on centrally acting medications of ropinirole, gabapentin, Klonopin.  Suspect that these medications were contributing to patient's worsening confusion and his fall.  We have held these during his admission and at discharge.  Patient will be discharged back to  his health, however, he was to be discharged to the part of the facility where he can be monitored for the next 24 hours for few days at the provider of the facility's discretion.  I do believe that the patient's confusion worsened secondary to hospital delirium and him being out of his usual environment. This was discussed with the patient and the patient's daughter who both agreed to this plan.  Bradycardia Patient initially found to be bradycardic in the 40s.  He was on metoprolol succinate 50 mg twice daily.  Cardiology assessed the patient during his admission.  His troponins were unremarkable and he was without EKG changes aside from his bradycardia.  His metoprolol was held and his heart rate increased to the 60s.  His episode of being somnolent and found on the ground was not thought to be due to his bradycardia.  There is no indication for pacemaker at this time per cardiology.  During the admission patient did have episodes of PACs and PVCs on telemetry but no significant pauses when his metoprolol was held. This was stopped at discharge he will follow-up with his cardiologist in the  next month.  Restless leg syndrome Patient with extensive history of restless leg syndrome on ropinirole, gabapentin.  These medications were stopped due to his confusion.  Would recommend reassessing and restarting as appropriate.  Chest pain This past week prior to his admission, the patient endorsed 2 episodes of chest pain that resolved with nitroglycerin.  Mechanical AVR Patient with crisp sounding valve on exam.  His prior echocardiograms were reviewed by cardiology.  There was no indication of hemorrhage or other injury or any indication to not continue his warfarin.  His warfarin is at therapeutic levels, INR of 2.5.  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  Patient believes that he was admitted to the hospital for cardiac surgery that is to be scheduled shortly.  His daughter states that he had 2 episodes of chest pain this past week that improved with nitroglycerin.  He was then found down on the ground before admission.  There is uncertainty as to how long he was down on the ground.  He was found at 4 AM.  He does not remember these events occurring.  Patient's daughter states that the patient's baseline is oriented but does have some confusion in the afternoon.  He has been experience short-term memory issues recently.  She also notes that he does experience urinary incontinence chronically due to previous prostate procedures.  Since his arrival patient's daughter states that he has been experiencing hallucinations believing people are speaking [place] to him.  1432 Patient was reassessed and daughter noted worsening confusion.  I discussed with him his findings of no metabolic or infectious abnormalities.  Suspected that worsening confusion was secondary to hospital delirium and being out of his natural [place].  Patient's daughter notes that she is okay with him returning back to his facility if he goes to the section where he  can be monitored more closely for the next day or so and seen by their providers there.  Discussed this with social work.  Note that patient will be going back to out of with closer and more frequent monitoring.  Patient was pleasantly demented during my reassessment of his mental status.  He was aware of time but uncertain of why he was admitted to the hospital.  Discharge Exam:   BP (!) 160/89   Pulse 64  Temp 98.7 F (37.1 C) (Oral)   Resp 16   Ht 5\' 8"  (1.727 m)   Wt 92.9 kg   SpO2 97%   BMI 31.14 kg/m  Constitutional: Well-appearing, no acute distress HENT: normocephalic atraumatic, mucous membranes moist Eyes: conjunctiva non-erythematous Neck: supple Cardiovascular: regular rate and rhythm, crisp mechanical S2 heart sound. Pulmonary/Chest: normal work of breathing on room air, lungs clear to auscultation bilaterally Abdominal: soft, non-tender, non-distended MSK: normal bulk and tone Neurological: Alert and oriented to person and time.  Not oriented to situation or place Skin: warm and dry Psych: Pleasantly demented  Pertinent Labs, Studies, and Procedures:  CBC Latest Ref Rng & Units 04/30/2021 04/29/2021 04/15/2021  WBC 4.0 - 10.5 K/uL 6.3 5.2 6.1  Hemoglobin 13.0 - 17.0 g/dL 11.9(L) 10.6(L) 12.3(L)  Hematocrit 39.0 - 52.0 % 34.7(L) 32.3(L) 35.2(L)  Platelets 150 - 400 K/uL 170 149(L) 171    CMP Latest Ref Rng & Units 04/30/2021 04/29/2021 04/15/2021  Glucose 70 - 99 mg/dL 118(H) 125(H) 132(H)  BUN 8 - 23 mg/dL 23 23 23   Creatinine 0.61 - 1.24 mg/dL 0.98 1.24 0.85  Sodium 135 - 145 mmol/L 134(L) 133(L) 139  Potassium 3.5 - 5.1 mmol/L 4.2 4.3 4.9  Chloride 98 - 111 mmol/L 101 100 101  CO2 22 - 32 mmol/L 24 24 18(L)  Calcium 8.9 - 10.3 mg/dL 9.6 9.4 9.9  Total Protein 6.5 - 8.1 g/dL 7.2 - 7.5  Total Bilirubin 0.3 - 1.2 mg/dL 0.8 - 0.7  Alkaline Phos 38 - 126 U/L 46 - 63  AST 15 - 41 U/L 30 - 56(H)  ALT 0 - 44 U/L 17 - 30    CT Head Wo Contrast  Result Date:  04/29/2021 CLINICAL DATA:  87 year old male found on the floor next to the bed, altered mental status, bradycardia. EXAM: CT HEAD WITHOUT CONTRAST TECHNIQUE: Contiguous axial images were obtained from the base of the skull through the vertex without intravenous contrast. COMPARISON:  Brain MRI 10/09/2016.  Head CT 10/27/2020. FINDINGS: Brain: No midline shift, ventriculomegaly, mass effect, evidence of mass lesion, intracranial hemorrhage or evidence of cortically based acute infarction. Chronically advanced bilateral white matter hypodensity and heterogeneity in the left thalamus appears stable and compatible with chronic small vessel disease. There are small chronic infarcts in the bilateral cerebellum, most notably the right SCA territory. Vascular: Calcified atherosclerosis at the skull base. No suspicious intracranial vascular hyperdensity. Skull: No acute osseous abnormality identified. Chronic degenerative changes about the odontoid with subchondral cysts. Sinuses/Orbits: Chronic bilateral paranasal sinus disease is stable since December, aside from increased bubbly opacity in the right sphenoid now. Tympanic cavities and mastoids remain well aerated. Other: Calcified scalp vessel atherosclerosis. No acute orbit or scalp soft tissue finding. IMPRESSION: 1. No acute intracranial abnormality. 2. Advanced chronic small vessel ischemia appears stable since December. 3. Chronic paranasal sinus disease. Electronically Signed   By: Genevie Ann M.D.   On: 04/29/2021 04:50   CT Cervical Spine Wo Contrast  Result Date: 04/29/2021 CLINICAL DATA:  87 year old male found on the floor next to the bed, altered mental status, bradycardia. EXAM: CT CERVICAL SPINE WITHOUT CONTRAST TECHNIQUE: Multidetector CT imaging of the cervical spine was performed without intravenous contrast. Multiplanar CT image reconstructions were also generated. COMPARISON:  [facility] Cervical spine CT  11/14/2020. FINDINGS: Alignment: Stable since December. Mild degenerative anterolisthesis of C5 on C6, with straightening of upper cervical lordosis. Skull base and vertebrae: Osteopenia. Visualized skull  base is intact. No atlanto-occipital dissociation. No acute osseous abnormality identified. Soft tissues and spinal canal: No prevertebral fluid or swelling. No visible canal hematoma. Bulky calcified cervical carotid atherosclerosis. Disc levels: Bulky degenerative ligamentous hypertrophy about the odontoid which demonstrates multiple degenerative subchondral cysts. C3-C4 ankylosis is chronic. Severe chronic disc and endplate degeneration at C4-C5. Degenerative anterolisthesis with disc, endplate, and posterior element degeneration at C5-C6. But only mild degenerative cervical spinal stenosis is suspected and appears stable. Upper chest: Calcified aortic atherosclerosis. Tortuous proximal great vessels. Grossly intact visible upper thoracic levels. IMPRESSION: 1. No acute traumatic injury identified in the cervical spine. 2. Stable advanced cervical spine degeneration. 3. Aortic Atherosclerosis (ICD10-I70.0). Electronically Signed   By: Genevie Ann M.D.   On: 04/29/2021 04:53   DG Chest Portable 1 View  Result Date: 04/29/2021 CLINICAL DATA:  87 year old male status post fall. Altered mental status. Bradycardia. EXAM: PORTABLE CHEST 1 VIEW COMPARISON:  Chest radiographs 07/19/2019. FINDINGS: Portable AP view at 0410 hours. Stable sequelae of CABG and mediastinal contours. Visualized tracheal air column is within normal limits. Mildly lower lung volumes. But when Allowing for portable technique the lungs are clear. No pneumothorax or pleural effusion. Paucity of bowel gas in the upper abdomen. No acute osseous abnormality identified. Partially visible chronic right shoulder arthroplasty. IMPRESSION: Lower lung volumes.  No acute cardiopulmonary abnormality. Electronically Signed   By: Genevie Ann M.D.   On: 04/29/2021  04:17     Discharge Instructions: Discharge Instructions     Call MD for:  difficulty breathing, headache or visual disturbances   Complete by: As directed    Call MD for:  hives   Complete by: As directed    Call MD for:  persistant dizziness or light-headedness   Complete by: As directed    Call MD for:  persistant nausea and vomiting   Complete by: As directed    Call MD for:  redness, tenderness, or signs of infection (pain, swelling, redness, odor or green/yellow discharge around incision site)   Complete by: As directed    Call MD for:  severe uncontrolled pain   Complete by: As directed    Call MD for:  temperature >100.4   Complete by: As directed    Diet - low sodium heart healthy   Complete by: As directed    Increase activity slowly   Complete by: As directed        Signed: Riesa Pope, MD 04/30/2021, 2:33 PM   Pager: [phone]

## 2021-04-30 NOTE — Progress Notes (Signed)
[place] for Warfarin Indication:  Aortic Valve Replacement  + afib  Allergies  Allergen Reactions   Atorvastatin Calcium Other (See Comments)    Developed myopathy. Developed myopathy.    Dorzolamide Other (See Comments)    Follicula Follicula    Brimonidine Other (See Comments)    Red and burning eyes   Brinzolamide-Brimonidine     Redness in eyes - reaction to [place] [Atorvastatin Calcium]     Developed myopathy.   Rotigotine Other (See Comments)    Falling asleep uncontrollably - reaction to Neupro   Tamsulosin Other (See Comments)    Unknown reaction (Flomax)   Vancomycin     Red Rash   Zolpidem Other (See Comments)    Pt. states that he possibly had a neuro reaction.   Dutasteride Rash    Reaction to Avodart    Patient Measurements: Height: 5\' 8"  (172.7 cm) Weight: 92.9 kg (204 lb 12.9 oz) IBW/kg (Calculated) : 68.4   Vital Signs: Temp: 98.7 F (37.1 C) (06/16 0411) Temp Source: Oral (06/16 0411) BP: 189/73 (06/16 0411) Pulse Rate: 64 (06/16 0411)  Labs: Recent Labs    04/29/21 0355 04/29/21 0511 04/29/21 0758 04/30/21 0208 04/30/21 1201  HGB 10.6*  --   --  11.9*  --   HCT 32.3*  --   --  34.7*  --   PLT 149*  --   --  170  --   LABPROT  --  26.7*  --   --  28.7*  INR  --  2.5*  --   --  2.7*  CREATININE 1.24  --   --  0.98  --   TROPONINIHS  --  8 8  --   --      Estimated Creatinine Clearance: 51 mL/min (by C-G formula based on SCr of 0.98 mg/dL).   Medical History:   Medications:  Scheduled:   allopurinol  300 mg Oral Daily   fluticasone furoate-vilanterol  1 puff Inhalation Daily   latanoprost  1 drop Both Eyes QHS   levothyroxine  112 mcg Oral Once per day on Sun Mon Tue Wed Thu Fri   And   [START ON 05/02/2021] levothyroxine  200 mcg Oral Once per day on Sat   timolol  1 drop Both Eyes BID   warfarin  4 mg Oral ONCE-1600   Warfarin - Pharmacist Dosing Inpatient   Does not  apply q1600    Assessment:  From facility for disorientation. PTA warfarin 26mg  weekly, 3mg  two days per week, 4mg  five days per week.  Unclear from SNF records which days are which.   INR today is 2.7, within goal range.  No overt bleeding or complications noted  Goal of Therapy:  INR Target 2.5-3.5 (according to [facility] Dr. Eulas Post 03/16/2021) Monitor platelets by anticoagulation protocol: Yes   Plan:  Warfarin 4 mg x 1 again tonight. Daily INR Monitor for signs and symptoms of bleeding  [place] Crane, Roylene Reason, BCCP Clinical Pharmacist  04/30/2021 1:38 PM   [facility] pharmacy phone numbers are listed on [email or web address]

## 2021-05-04 ENCOUNTER — Other Ambulatory Visit: Payer: Self-pay

## 2021-05-04 ENCOUNTER — Emergency Department (HOSPITAL_COMMUNITY): Payer: Medicare Other

## 2021-05-04 ENCOUNTER — Emergency Department (HOSPITAL_COMMUNITY)
Admission: EM | Admit: 2021-05-04 | Discharge: 2021-05-05 | Disposition: A | Discharge status: Home / Self Care | Payer: Medicare Other | Attending: Emergency Medicine | Admitting: Emergency Medicine

## 2021-05-04 DIAGNOSIS — E1169 Type 2 diabetes mellitus with other specified complication: Secondary | ICD-10-CM | POA: Insufficient documentation

## 2021-05-04 DIAGNOSIS — Z951 Presence of aortocoronary bypass graft: Secondary | ICD-10-CM | POA: Diagnosis not present

## 2021-05-04 DIAGNOSIS — Z7901 Long term (current) use of anticoagulants: Secondary | ICD-10-CM | POA: Diagnosis not present

## 2021-05-04 DIAGNOSIS — E785 Hyperlipidemia, unspecified: Secondary | ICD-10-CM | POA: Insufficient documentation

## 2021-05-04 DIAGNOSIS — Z87891 Personal history of nicotine dependence: Secondary | ICD-10-CM | POA: Diagnosis not present

## 2021-05-04 DIAGNOSIS — I2510 Atherosclerotic heart disease of native coronary artery without angina pectoris: Secondary | ICD-10-CM | POA: Insufficient documentation

## 2021-05-04 DIAGNOSIS — Z7951 Long term (current) use of inhaled steroids: Secondary | ICD-10-CM | POA: Diagnosis not present

## 2021-05-04 DIAGNOSIS — Z79899 Other long term (current) drug therapy: Secondary | ICD-10-CM | POA: Insufficient documentation

## 2021-05-04 DIAGNOSIS — I110 Hypertensive heart disease with heart failure: Secondary | ICD-10-CM | POA: Insufficient documentation

## 2021-05-04 DIAGNOSIS — J45909 Unspecified asthma, uncomplicated: Secondary | ICD-10-CM | POA: Diagnosis not present

## 2021-05-04 DIAGNOSIS — W07XXXA Fall from chair, initial encounter: Secondary | ICD-10-CM | POA: Insufficient documentation

## 2021-05-04 DIAGNOSIS — I509 Heart failure, unspecified: Secondary | ICD-10-CM | POA: Diagnosis not present

## 2021-05-04 DIAGNOSIS — R413 Other amnesia: Secondary | ICD-10-CM | POA: Insufficient documentation

## 2021-05-04 DIAGNOSIS — Z043 Encounter for examination and observation following other accident: Secondary | ICD-10-CM | POA: Insufficient documentation

## 2021-05-04 DIAGNOSIS — E039 Hypothyroidism, unspecified: Secondary | ICD-10-CM | POA: Insufficient documentation

## 2021-05-04 DIAGNOSIS — E1142 Type 2 diabetes mellitus with diabetic polyneuropathy: Secondary | ICD-10-CM | POA: Insufficient documentation

## 2021-05-04 DIAGNOSIS — W19XXXA Unspecified fall, initial encounter: Secondary | ICD-10-CM

## 2021-05-04 LAB — PROTIME-INR
INR: 3.2 — ABNORMAL HIGH (ref 0.8–1.2)
Prothrombin Time: 32.5 seconds — ABNORMAL HIGH (ref 11.4–15.2)

## 2021-05-04 LAB — CBC WITH DIFFERENTIAL/PLATELET
Abs Immature Granulocytes: 0.01 10*3/uL (ref 0.00–0.07)
Basophils Absolute: 0 10*3/uL (ref 0.0–0.1)
Basophils Relative: 1 %
Eosinophils Absolute: 0.2 10*3/uL (ref 0.0–0.5)
Eosinophils Relative: 4 %
HCT: 35.9 % — ABNORMAL LOW (ref 39.0–52.0)
Hemoglobin: 11.6 g/dL — ABNORMAL LOW (ref 13.0–17.0)
Immature Granulocytes: 0 %
Lymphocytes Relative: 35 %
Lymphs Abs: 1.9 10*3/uL (ref 0.7–4.0)
MCH: 32.5 pg (ref 26.0–34.0)
MCHC: 32.3 g/dL (ref 30.0–36.0)
MCV: 100.6 fL — ABNORMAL HIGH (ref 80.0–100.0)
Monocytes Absolute: 0.7 10*3/uL (ref 0.1–1.0)
Monocytes Relative: 13 %
Neutro Abs: 2.6 10*3/uL (ref 1.7–7.7)
Neutrophils Relative %: 47 %
Platelets: 174 10*3/uL (ref 150–400)
RBC: 3.57 MIL/uL — ABNORMAL LOW (ref 4.22–5.81)
RDW: 15.2 % (ref 11.5–15.5)
WBC: 5.4 10*3/uL (ref 4.0–10.5)
nRBC: 0 % (ref 0.0–0.2)

## 2021-05-04 LAB — BASIC METABOLIC PANEL
Anion gap: 8 (ref 5–15)
BUN: 31 mg/dL — ABNORMAL HIGH (ref 8–23)
CO2: 25 mmol/L (ref 22–32)
Calcium: 9.5 mg/dL (ref 8.9–10.3)
Chloride: 105 mmol/L (ref 98–111)
Creatinine, Ser: 1.15 mg/dL (ref 0.61–1.24)
GFR, Estimated: 59 mL/min — ABNORMAL LOW (ref >60)
Glucose, Bld: 120 mg/dL — ABNORMAL HIGH (ref 70–99)
Potassium: 4.3 mmol/L (ref 3.5–5.1)
Sodium: 138 mmol/L (ref 135–145)

## 2021-05-04 NOTE — Discharge Instructions (Addendum)
Your labs and imaging did not show any significant findings  Return for new or worsening symptoms

## 2021-05-04 NOTE — ED Provider Notes (Signed)
[facility] EMERGENCY DEPARTMENT Provider Note   CSN: [id number] Arrival date & time: 05/04/21  1150    History Chief Complaint  Patient presents with   Peter Beltran is a 87 y.o. male with extensive past medical history, see below who presents for evaluation of fall.  Patient states he was sitting in his chair this morning and slid out of his chair.  He does not think he hit his head.  EMS initially called out and patient refused transport.  Apparently had a "film over my eyes" per EMS and patient was transported here. Patient denies any complaints.  States he does not know why they sent him here. He denies any double vision or blurred vision.  No headache, chest pain, shortness of breath.  Recently admitted for bradycardia and lethargy.  Subsequently discharged 4 days ago.  It appears he had some short-term memory issues during that time. Did not have any infectious etiology during hospitalization.   Level 5 caveat-cognitive impairment per prior chart review    Contact wife Peter Beltran at [phone]  >>>Walker nearby, pushed forward and fell over walker, unclear if head hit dresser. No vision complaints with wife. No sx or complaints prior to fall   HPI     Past Medical History:  Diagnosis Date   Acute kidney failure, unspecified ([place]) 07/22/2019   Acute maxillary sinusitis, unspecified 08/16/2017   Allergic rhinitis, unspecified 02/04/2018   Anxiety disorder, unspecified 09/17/2013   Anxiety state 09/17/2013   Aortic stenosis    post aortic valve replacement with [employer]'s valve by Dr.VanTrigt on 07-27-11   Aortic valve disorder 05/29/2013   Atherosclerotic heart disease of native coronary artery without angina pectoris 08/16/2017   Benign prostatic hyperplasia with lower urinary tract symptoms 08/16/2017   Candidal stomatitis 08/16/2017   Cellulitis, unspecified 07/25/2019   Chest pain 04/10/2016   Chronic airway obstruction ([place]) 04/21/2013    Chronic coronary artery disease 04/10/2016   Formatting of this note might be different from the original. Overview:  status post CABG x3 in 2008 Formatting of this note might be different from the original. Overview:  Overview:  status post CABG x3 in 2008   Chronic rhinitis 12/27/2019   Last Assessment & Plan:  Concern over his nose. Chronic recurring nasal congestion.  Had a severe episode recently that responded to over-the-counter medication.  Currently asymptomatic.  Wants to make sure all is okay. EXAM intranasally shows no polyps, purulence, masses or obstructing anatomy.  Mucosa is overall healthy. PLAN: Reassured all looks okay.  I think what he is doing is fine.  Happy t   Cognitive communication deficit 10/28/2020   Constipation 08/16/2017   Constipation, unspecified 08/16/2017   Coronary artery disease    status post CABG x3 in 2008   Cough 10/20/2016   Diabetic polyneuropathy associated with type 2 diabetes mellitus ([place]) 10/04/2016   Disorder of the skin and subcutaneous tissue, unspecified 07/25/2019   Disorders of muscle in diseases classified elsewhere, multiple sites 10/28/2020   Disturbance of salivary secretion 04/21/2013   Dry mouth, unspecified 10/18/2018   Dyslipidemia    Dysphagia 04/21/2013   Encounter for current long-term use of anticoagulants 05/29/2013   Formatting of this note might be different from the original. IMO routine update Formatting of this note might be different from the original. Overview:  IMO routine update   Encounter for immunization 07/25/2019   Enlarged prostate with lower urinary tract symptoms (LUTS) 05/29/2013  Esophageal reflux 02/26/2014   Essential hypertension 01/19/2019   Follow-up visit for aortic valve replacement with metallic valve 04/18/4033   Foreign body aspiration 03/05/2016   Gastro-esophageal reflux disease without esophagitis 02/26/2014   Generalized edema 10/18/2018   Gout    Gouty arthropathy 05/29/2013   Hidrocystoma of left eyelid  12/10/2014   History of falling 10/28/2020   Hyperlipidemia, unspecified 11/11/2016   Hyperplasia of prostate    benign   Hyperplasia of prostate    benign   Hypersomnia 06/14/2013   Hypertension    Hypertensive heart disease with heart failure ([place]) 10/18/2018   Hypertriglyceridemia 11/11/2016   Hypothyroidism    Hypothyroidism, unspecified 04/10/2016   Impacted cerumen, bilateral 08/16/2017   Impaired fasting glucose 04/21/2013   Iron deficiency anemia, unspecified 08/16/2017   Ischemic optic neuropathy of both eyes 01/28/2016   Localized edema 07/25/2019   Long term (current) use of anticoagulants 05/29/2013   Overview:  IMO routine update IMO routine update   Long term (current) use of aspirin 08/16/2017   Low back pain 08/16/2017   Low-tension glaucoma of both eyes, 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/2016   tmax 12/14 OCT 11/03/2016 VF 12/04/2015 Gonio 05/07/2015 tmax 12/14 OCT 11/03/2016 VF 12/04/2015 Gonio 05/07/2015   Low-tension glaucoma, bilateral, 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/2016   tmax 12/14 OCT 11/03/2016 VF 12/04/2015 Gonio 05/07/2015 tmax 12/14 OCT 11/03/2016 VF 12/04/2015 Gonio 05/07/2015   Lumbar stenosis 04/30/2015   Memory loss, short term 03/30/2016   Mild cognitive impairment 08/30/2016   Mixed dyslipidemia 01/19/2019   Moderate persistent asthma without complication 7/42/5956   Moderate persistent asthma, uncomplicated 3/87/5643   Monitoring for long-term anticoagulant use 08/03/2017   Muscle weakness (generalized) 10/28/2020   Myocardial infarct ([place])    Nasal congestion 02/04/2018   Neuropathy 04/04/2014   Obstructive sleep apnea 12/17/2013   Obstructive sleep apnea of adult 12/17/2013   Old myocardial infarction 08/22/2019   Oral mucositis (ulcerative), unspecified 10/28/2020   Osteoarthrosis 04/21/2013   Other malaise 07/25/2019   Other specified disorders of nose and nasal sinuses 10/28/2020   Overweight 01/19/2019   Pain in the chest    Pain in thoracic spine 10/18/2018   Periodic limb movements  of sleep 08/26/2013   Presence of aortocoronary bypass graft 10/18/2018   Presence of prosthetic heart valve 10/18/2018   Pseudophakia of both eyes 12/10/2014   Psoriasis and similar disorder 01/28/2014   Rash and other nonspecific skin eruption 08/16/2017   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/28/2013   Restless legs syndrome 08/26/2013   Spells 08/26/2013   Spinal stenosis, lumbar region without neurogenic claudication 04/30/2015   Status post aortic valve replacement with metallic valve 01/14/9517   Status post shoulder joint replacement    right shoulder   Subtherapeutic anticoagulation 04/10/2016   Syncope and collapse 06/14/2013   Overview:  with seizure like activity. with seizure like activity.   Traumatic subarachnoid hemorrhage without loss of consciousness, subsequent encounter 10/28/2020   Type 2 diabetes mellitus with diabetic neuropathy, unspecified ([place]) 08/16/2017   Unspecified abnormalities of gait and mobility 10/18/2018   Unspecified atrial fibrillation ([place]) 03/30/2017   Unspecified osteoarthritis, unspecified site 04/21/2013   Unspecified urinary incontinence 05/17/2018   Urinary retention 07/18/2018   Vitamin D deficiency, unspecified 08/16/2017    Patient Active Problem List   Diagnosis Date Noted   Fall 04/30/2021   Bradycardia with less than 60 beats per minute 04/29/2021   Gout    Hyperplasia of prostate    Hypothyroidism    Myocardial infarct (  [place])    Cognitive communication deficit 10/28/2020   Disorders of muscle in diseases classified elsewhere, multiple sites 10/28/2020   History of falling 10/28/2020   Muscle weakness (generalized) 10/28/2020   Oral mucositis (ulcerative), unspecified 10/28/2020   Other specified disorders of nose and nasal sinuses 10/28/2020   Traumatic subarachnoid hemorrhage without loss of consciousness, subsequent encounter 10/28/2020   Aortic stenosis    Hypertension    Status post shoulder joint replacement    Chronic rhinitis 12/27/2019   Old  myocardial infarction 08/22/2019   Other malaise 07/25/2019   Cellulitis, unspecified 07/25/2019   Disorder of the skin and subcutaneous tissue, unspecified 07/25/2019   Encounter for immunization 07/25/2019   Localized edema 07/25/2019   Acute kidney failure, unspecified ([place]) 07/22/2019   Status post aortic valve replacement with metallic valve 75/08/2584   Essential hypertension 01/19/2019   Mixed dyslipidemia 01/19/2019   Follow-up visit for aortic valve replacement with metallic valve 27/78/2423   Overweight 01/19/2019   Unspecified abnormalities of gait and mobility 10/18/2018   Presence of prosthetic heart valve 10/18/2018   Presence of aortocoronary bypass graft 10/18/2018   Pain in thoracic spine 10/18/2018   Generalized edema 10/18/2018   Hypertensive heart disease with heart failure ([place]) 10/18/2018   Dry mouth, unspecified 10/18/2018   Urinary retention 07/18/2018   Unspecified urinary incontinence 05/17/2018   Nasal congestion 02/04/2018   Allergic rhinitis, unspecified 02/04/2018   Vitamin D deficiency, unspecified 08/16/2017   Type 2 diabetes mellitus with diabetic neuropathy, unspecified ([place]) 08/16/2017   Rash and other nonspecific skin eruption 08/16/2017   Low back pain 08/16/2017   Long term (current) use of aspirin 08/16/2017   Iron deficiency anemia, unspecified 08/16/2017   Impacted cerumen, bilateral 08/16/2017   Acute maxillary sinusitis, unspecified 08/16/2017   Candidal stomatitis 08/16/2017   Constipation, unspecified 08/16/2017   Atherosclerotic heart disease of native coronary artery without angina pectoris 08/16/2017   Benign prostatic hyperplasia with lower urinary tract symptoms 08/16/2017   Constipation 08/16/2017   Monitoring for long-term anticoagulant use 08/03/2017   Hyperlipidemia, unspecified 11/11/2016   Hypertriglyceridemia 11/11/2016   Diabetic polyneuropathy associated with type 2 diabetes mellitus ([place]) 10/04/2016   Mild cognitive  impairment 08/30/2016   Coronary artery disease 04/10/2016   Hypothyroidism, unspecified 04/10/2016   Dyslipidemia 04/10/2016   Subtherapeutic anticoagulation 04/10/2016   Chronic coronary artery disease 04/10/2016   Memory loss, short term 03/30/2016   Foreign body aspiration 03/05/2016   Moderate persistent asthma, uncomplicated 53/61/4431   Moderate persistent asthma without complication 54/00/8676   Ischemic optic neuropathy of both eyes 01/28/2016   Low-tension glaucoma, bilateral, 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/2016   Low-tension glaucoma of both eyes, 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/2016   Spinal stenosis, lumbar region without neurogenic claudication 04/30/2015   Lumbar stenosis 04/30/2015   Hidrocystoma of left eyelid 12/10/2014   Pseudophakia of both eyes 12/10/2014   Neuropathy 04/04/2014   Gastro-esophageal reflux disease without esophagitis 02/26/2014   Esophageal reflux 02/26/2014   Psoriasis and similar disorder 01/28/2014   Obstructive sleep apnea 12/17/2013   Obstructive sleep apnea of adult 12/17/2013   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/28/2013   Anxiety disorder, unspecified 09/17/2013   Anxiety state 09/17/2013   Restless legs syndrome 08/26/2013   Spells 08/26/2013   Periodic limb movements of sleep 08/26/2013   Hypersomnia 06/14/2013   Syncope and collapse 06/14/2013   Aortic valve disorder 05/29/2013   Enlarged prostate with lower urinary tract symptoms (LUTS) 05/29/2013   Gouty arthropathy 05/29/2013   Long  term (current) use of anticoagulants 05/29/2013   Encounter for current long-term use of anticoagulants 05/29/2013   Chronic airway obstruction ([place]) 04/21/2013   Disturbance of salivary secretion 04/21/2013   Dysphagia 04/21/2013   Impaired fasting glucose 04/21/2013   Unspecified osteoarthritis, unspecified site 04/21/2013   Osteoarthrosis 04/21/2013    Past Surgical History:  Procedure Laterality Date   AORTIC VALVE REPLACEMENT     CORONARY ARTERY BYPASS GRAFT      PROSTATE ABLATION     TONSILLECTOMY         Family History  Problem Relation Age of Onset   Hypertension Neg Hx    Diabetes Neg Hx    Cancer Neg Hx    Heart disease Neg Hx     Social History   Tobacco Use   Smoking status: Former    Pack years: 0.00   Smokeless tobacco: Never  Substance Use Topics   Alcohol use: No   Drug use: No    Home Medications Prior to Admission medications   Medication Sig Start Date End Date Taking? Authorizing Provider  acetaminophen (TYLENOL) 650 MG CR tablet Take 650 mg by mouth in the morning and at bedtime.    [provider]  albuterol (PROVENTIL HFA;VENTOLIN HFA) 108 (90 Base) MCG/ACT inhaler Inhale 2 puffs into the lungs every 6 (six) hours as needed for wheezing or shortness of breath.    [provider]  allopurinol (ZYLOPRIM) 300 MG tablet Take 300 mg by mouth daily.    [provider]  antiseptic oral rinse (BIOTENE) LIQD 30 mLs by Mouth Rinse route in the morning and at bedtime.    [provider]  Docusate Sodium (DSS) 100 MG CAPS Take 100 mg by mouth as needed for constipation.    [provider]  ergocalciferol (VITAMIN D2) 1.25 MG (50000 UT) capsule Take 50,000 Units by mouth every Tuesday.    [provider]  fenofibrate (TRICOR) 145 MG tablet Take 145 mg by mouth daily.    [provider]  Fluticasone-Salmeterol (ADVAIR) 100-50 MCG/DOSE AEPB Inhale 2 puffs into the lungs 2 (two) times daily.    [provider]  icosapent Ethyl (VASCEPA) 1 g capsule Take 2 capsules (2 g total) by mouth 2 (two) times daily. 04/23/21   Revankar, Reita Cliche, MD  latanoprost (XALATAN) 0.005 % ophthalmic solution Place 1 drop into both eyes at bedtime.    [provider]  levothyroxine (SYNTHROID) 112 MCG tablet Take 112 mcg by mouth daily before breakfast. 6 days a week    [provider]  levothyroxine (SYNTHROID) 200 MCG tablet Take 200 mcg by mouth every 7 (seven)  days.    [provider]  magic mouthwash (nystatin, hydrocortisone, diphenhydrAMINE) suspension Swish and spit 5 mLs 4 (four) times daily as needed for mouth pain.    [provider]  nitroGLYCERIN (NITROSTAT) 0.4 MG SL tablet Place 1 tablet (0.4 mg total) under the tongue every 5 (five) minutes as needed for chest pain. 04/21/21 07/20/21  Revankar, Reita Cliche, MD  omeprazole (PRILOSEC) 20 MG capsule Take 20 mg by mouth 2 (two) times daily before a meal.    [provider]  predniSONE (DELTASONE) 20 MG tablet Take 20 mg by mouth daily as needed (acute pain).    [provider]  SLOW FE 142 (45 Fe) MG TBCR Take 45 mg by mouth daily. 04/01/21   [provider]  sodium chloride (OCEAN) 0.65 % SOLN nasal spray Place 1 spray  into both nostrils as needed for congestion.    [provider]  spironolactone (ALDACTONE) 25 MG tablet Take 25 mg by mouth daily.    [provider]  timolol (TIMOPTIC) 0.5 % ophthalmic solution Place 1 drop into both eyes 2 (two) times daily. 12/24/19   [provider]  warfarin (COUMADIN) 3 MG tablet Take 3 mg by mouth 2 (two) times a week.    [provider]  warfarin (COUMADIN) 4 MG tablet Take 4 mg by mouth as directed. 5 times weekly    [provider]    Allergies    Atorvastatin calcium, Dorzolamide, Brimonidine, Brinzolamide-brimonidine, Lipitor [atorvastatin calcium], Rotigotine, Tamsulosin, Vancomycin, Zolpidem, and Dutasteride  Review of Systems   Review of Systems  Unable to perform ROS: Dementia  All other systems reviewed and are negative.  Physical Exam Updated Vital Signs BP (!) 160/66   Pulse (!) 58   Temp 97.9 F (36.6 C)   Resp 16   Ht 5\' 8"  (1.727 m)   Wt 99.8 kg   SpO2 96%   BMI 33.45 kg/m   Physical Exam Vitals and nursing note reviewed.  Constitutional:      General: He is not in acute distress.    Appearance: He is well-developed. He is not ill-appearing,  toxic-appearing or diaphoretic.  HENT:     Head: Normocephalic and atraumatic.     Jaw: There is normal jaw occlusion.     Mouth/Throat:     Lips: Pink.     Mouth: Mucous membranes are moist.     Pharynx: Oropharynx is clear. Uvula midline.     Comments: Tongue midline.  Posterior oropharynx clear.  Smile symmetric Eyes:     Extraocular Movements: Extraocular movements intact.     Conjunctiva/sclera: Conjunctivae normal.     Pupils: Pupils are equal, round, and reactive to light.     Visual Fields: Right eye visual fields normal and left eye visual fields normal.     Comments: PERRLA Able to visualized fingers in all 4 quadrants without difficulty  Neck:     Trachea: Trachea and phonation normal.     Comments: No midline neck pain.  Full range of motion without difficulty Cardiovascular:     Rate and Rhythm: Normal rate and regular rhythm.     Pulses: Normal pulses.     Heart sounds: Normal heart sounds.  Pulmonary:     Effort: Pulmonary effort is normal. No respiratory distress.     Breath sounds: Normal breath sounds and air entry.     Comments: Clear, speaks in full sentences without difficulty Chest:     Comments: Equal rise and fall to chest wall. Abdominal:     General: Bowel sounds are normal. There is no distension.     Palpations: Abdomen is soft.     Tenderness: There is no abdominal tenderness. There is no guarding or rebound.     Comments: Soft, nontender without rebound or guarding  Musculoskeletal:        General: Normal range of motion.     Cervical back: Full passive range of motion without pain, normal range of motion and neck supple.     Comments: No bony tenderness.  Moves all 4 extremities without difficulty.  Skin:    General: Skin is warm and dry.     Capillary Refill: Capillary refill takes less than 2 seconds.     Comments: No rashes or lesions  Neurological:     General: No focal  deficit present.     Mental Status: He is alert.     Cranial Nerves:  Cranial nerves are intact.     Sensory: Sensation is intact.     Motor: Motor function is intact.     Coordination: Coordination is intact.     Comments: Cranial nerves II to XII grossly intact Moves all 4 extremities at difficulty Equal strength bilaterally Negative finger-to-nose bilaterally Alert to person, place (hospital), year (2022)    ED Results / Procedures / Treatments   Labs (all labs ordered are listed, but only abnormal results are displayed) Labs Reviewed  CBC WITH DIFFERENTIAL/PLATELET - Abnormal; Notable for the following components:      Result Value   RBC 3.57 (*)    Hemoglobin 11.6 (*)    HCT 35.9 (*)    MCV 100.6 (*)    All other components within normal limits  BASIC METABOLIC PANEL - Abnormal; Notable for the following components:   Glucose, Bld 120 (*)    BUN 31 (*)    GFR, Estimated 59 (*)    All other components within normal limits  PROTIME-INR - Abnormal; Notable for the following components:   Prothrombin Time 32.5 (*)    INR 3.2 (*)    All other components within normal limits   EKG None  Radiology DG Chest 2 View  Result Date: 05/04/2021 CLINICAL DATA:  Status post fall. EXAM: CHEST - 2 VIEW COMPARISON:  04/29/2021 FINDINGS: No focal consolidation. Mild bibasilar atelectasis. No pleural effusion or pneumothorax. Stable cardiomediastinal silhouette. Prior CABG. No acute osseous abnormality. Right shoulder arthroplasty. IMPRESSION: No active cardiopulmonary disease. Electronically Signed   By: Kathreen Devoid   On: 05/04/2021 13:23   DG Pelvis 1-2 Views  Result Date: 05/04/2021 CLINICAL DATA:  Acute pelvic pain following fall. EXAM: PELVIS - 1-2 VIEW COMPARISON:  None. FINDINGS: There is no evidence of pelvic fracture or diastasis. No pelvic bone lesions are seen. IMPRESSION: Negative. Electronically Signed   By: Margarette Canada M.D.   On: 05/04/2021 13:21   CT Head Wo Contrast  Result Date: 05/04/2021 CLINICAL DATA:  Acute head and neck pain  following fall today. Visual disturbance. Initial encounter. EXAM: CT HEAD WITHOUT CONTRAST CT CERVICAL SPINE WITHOUT CONTRAST TECHNIQUE: Multidetector CT imaging of the head and cervical spine was performed following the standard protocol without intravenous contrast. Multiplanar CT image reconstructions of the cervical spine were also generated. COMPARISON:  04/29/2021 CT and prior studies FINDINGS: CT HEAD FINDINGS Brain: No evidence of acute infarction, hemorrhage, hydrocephalus, extra-axial collection or mass lesion/mass effect. Atrophy, chronic small-vessel white matter ischemic changes and remote LEFT thalamic and cerebellar infarcts again noted. Vascular: Carotid and vertebral atherosclerotic calcifications are again noted. Skull: No acute abnormality. Sinuses/Orbits: No acute abnormality. Chronic mucosal thickening in the paranasal sinuses again identified. Other: None CT CERVICAL SPINE FINDINGS Alignment: Anterolisthesis of C5 in relation to C4 and C6 is unchanged. No new subluxation identified. Skull base and vertebrae: No acute fracture. No primary bone lesion or focal pathologic process. Soft tissues and spinal canal: No prevertebral fluid or swelling. No visible canal hematoma. Disc levels: Multilevel degenerative disc disease and facet arthropathy are unchanged. Upper chest: No acute abnormality Other: None IMPRESSION: 1. No evidence of acute intracranial abnormality. Atrophy, chronic small-vessel white matter ischemic changes and remote LEFT thalamic and cerebellar infarcts. 2. No static evidence of acute injury to the cervical spine. Degenerative changes as described. Electronically Signed   By: Cleatis Polka.D.  On: 05/04/2021 13:28   CT Cervical Spine Wo Contrast  Result Date: 05/04/2021 CLINICAL DATA:  Acute head and neck pain following fall today. Visual disturbance. Initial encounter. EXAM: CT HEAD WITHOUT CONTRAST CT CERVICAL SPINE WITHOUT CONTRAST TECHNIQUE: Multidetector CT imaging of  the head and cervical spine was performed following the standard protocol without intravenous contrast. Multiplanar CT image reconstructions of the cervical spine were also generated. COMPARISON:  04/29/2021 CT and prior studies FINDINGS: CT HEAD FINDINGS Brain: No evidence of acute infarction, hemorrhage, hydrocephalus, extra-axial collection or mass lesion/mass effect. Atrophy, chronic small-vessel white matter ischemic changes and remote LEFT thalamic and cerebellar infarcts again noted. Vascular: Carotid and vertebral atherosclerotic calcifications are again noted. Skull: No acute abnormality. Sinuses/Orbits: No acute abnormality. Chronic mucosal thickening in the paranasal sinuses again identified. Other: None CT CERVICAL SPINE FINDINGS Alignment: Anterolisthesis of C5 in relation to C4 and C6 is unchanged. No new subluxation identified. Skull base and vertebrae: No acute fracture. No primary bone lesion or focal pathologic process. Soft tissues and spinal canal: No prevertebral fluid or swelling. No visible canal hematoma. Disc levels: Multilevel degenerative disc disease and facet arthropathy are unchanged. Upper chest: No acute abnormality Other: None IMPRESSION: 1. No evidence of acute intracranial abnormality. Atrophy, chronic small-vessel white matter ischemic changes and remote LEFT thalamic and cerebellar infarcts. 2. No static evidence of acute injury to the cervical spine. Degenerative changes as described. Electronically Signed   By: Margarette Canada M.D.   On: 05/04/2021 13:28    Procedures Procedures   Medications Ordered in ED Medications - No data to display  ED Course  I have reviewed the triage vital signs and the nursing notes.  Pertinent labs & imaging results that were available during my care of the patient were reviewed by me and considered in my medical decision making (see chart for details).  87 year old here for evaluation of fall.  Likely mechanical per wife, Apparently fell  over walker. Unclear if hit head. EMS stated patient told family he had vision changes (cloudiness over Bl eyes) however wife and patient deny this. At baseline mentation. Patient without complaints on exam. NV intact. Moves all 4 extremities and no bony tenderness. Able to see in all 4 quadrants of eyes. Heart and lungs clear. No obvious traumatic injuries on exam.  Will check basic labs, imaging and reassess.  Labs and imaging personally reviewed and interpreted:  CBC without leukocytosis, Hgb 11.6 similar to prior BMP glucose 120 no additional aggravating or alleviating factors. Inr 3.2, therapeutic for mechanical valve CT head wo acute findings CT cervical wo acute findings DG pelvis wo acute findings DG chest wo acute findings  Patient reassessed. No complaints.  Was able to ambulate with a walker which he uses at baseline. Tolerating PO intake. No deficit son neuro exam. Patient requesting DC home.  The patient has been appropriately medically screened and/or stabilized in the ED. I have low suspicion for any other emergent medical condition which would require further screening, evaluation or treatment in the ED or require inpatient management.  Patient is hemodynamically stable and in no acute distress.  Patient able to ambulate in department prior to ED.  Evaluation does not show acute pathology that would require ongoing or additional emergent interventions while in the emergency department or further inpatient treatment.  I have discussed the diagnosis with the patient and answered all questions.  Pain is been managed while in the emergency department and patient has no further complaints prior to discharge.  Patient is comfortable with plan discussed in room and is stable for discharge at this time.  I have discussed strict return precautions for returning to the emergency department.  Patient was encouraged to follow-up with PCP/specialist refer to at discharge.      MDM  Rules/Calculators/A&P                           Final Clinical Impression(s) / ED Diagnoses Final diagnoses:  Fall, initial encounter    Rx / DC Orders ED Discharge Orders     None        Sumeya Yontz A, PA-C 05/04/21 1532    Charlesetta Shanks, MD 05/10/21 1221

## 2021-05-04 NOTE — ED Triage Notes (Signed)
Pt arrives via [facility] from [facility]. EMS was called earlier this morning for fall where pt slid onto the ground, initially refused. Later pt reported double vision and "a film over my eyes" and EMS was called back out. Pt A+Ox4 on arrival, NAD. Pt is on coumadin  EMS last VS - 124/68, HR 60, CBG 157, 97.9 temp, 99% on RA

## 2021-05-04 NOTE — ED Provider Notes (Signed)
I provided a substantive portion of the care of this patient.  I personally performed the entirety of the history for this encounter.     Patient slid out of his chair this morning.  Patient denies headache or chest pain.  Recent admission for bradycardia and lethargy.  Patient alert no acute distress.  No respiratory distress.  Speaking full sentences  Agree with plan of management.    Charlesetta Shanks, MD 05/10/21 1221

## 2021-05-05 NOTE — ED Notes (Signed)
Called [facility] to transport patient. States that there are several already on the list but could not give an exact number.

## 2021-06-02 ENCOUNTER — Ambulatory Visit: Payer: Medicare Other | Admitting: Cardiology

## 2021-06-08 ENCOUNTER — Encounter: Payer: Self-pay | Admitting: Cardiology

## 2021-06-08 ENCOUNTER — Other Ambulatory Visit: Payer: Self-pay

## 2021-06-08 ENCOUNTER — Ambulatory Visit (INDEPENDENT_AMBULATORY_CARE_PROVIDER_SITE_OTHER): Payer: Medicare Other | Admitting: Cardiology

## 2021-06-08 VITALS — BP 152/64 | HR 76 | Ht 68.0 in | Wt 206.1 lb

## 2021-06-08 DIAGNOSIS — E782 Mixed hyperlipidemia: Secondary | ICD-10-CM | POA: Diagnosis not present

## 2021-06-08 DIAGNOSIS — I2510 Atherosclerotic heart disease of native coronary artery without angina pectoris: Secondary | ICD-10-CM

## 2021-06-08 DIAGNOSIS — R001 Bradycardia, unspecified: Secondary | ICD-10-CM | POA: Diagnosis not present

## 2021-06-08 DIAGNOSIS — Z954 Presence of other heart-valve replacement: Secondary | ICD-10-CM | POA: Diagnosis not present

## 2021-06-08 DIAGNOSIS — Z8679 Personal history of other diseases of the circulatory system: Secondary | ICD-10-CM | POA: Diagnosis not present

## 2021-06-08 DIAGNOSIS — I10 Essential (primary) hypertension: Secondary | ICD-10-CM

## 2021-06-08 NOTE — Patient Instructions (Signed)

## 2021-06-08 NOTE — Progress Notes (Signed)
Cardiology Office Note:    Date:  06/08/2021   ID:  Peter Beltran, DOB January 22, 1925, MRN [id number]  PCP:  Javier Glazier, MD  Cardiologist:  Jenean Lindau, MD   Referring MD: Javier Glazier, MD    ASSESSMENT:    1. Coronary artery disease involving native coronary artery of native heart without angina pectoris   2. Essential hypertension   3. Mixed hyperlipidemia   4. Status post aortic valve replacement with metallic valve   5. Mixed dyslipidemia    PLAN:    In order of problems listed above:  Coronary artery disease: Secondary prevention stressed with the patient.  Importance of compliance with diet medication stressed and she vocalized understanding. Essential hypertension: Blood pressure stable and diet was emphasized.  Lifestyle modification urged. Post metallic aortic valve: Stable at this time.  Medical management Mixed dyslipidemia: On lipid-lowering medications followed by primary care. Patient will be seen in follow-up appointment in 6 months or earlier if the patient has any concerns    Medication Adjustments/Labs and Tests Ordered: Current medicines are reviewed at length with the patient today.  Concerns regarding medicines are outlined above.  No orders of the defined types were placed in this encounter.  No orders of the defined types were placed in this encounter.    No chief complaint on file.    History of Present Illness:    Peter Beltran is a 87 y.o. male.  Patient has past medical history of aortic valve replacement with metallic St. Jude's valve, essential hypertension and dyslipidemia and coronary artery disease.  He denies any problems at this time and takes care of activities of daily living.  He lives at a facility.  He tells me that he feels good and has no symptoms.  At the time of my evaluation, the patient is alert awake oriented and in no distress.  He is on anticoagulation managed by his primary care providers.  Past  Medical History:  Diagnosis Date   Acute kidney failure, unspecified ([place]) 07/22/2019   Acute maxillary sinusitis, unspecified 08/16/2017   Allergic rhinitis, unspecified 02/04/2018   Anxiety disorder, unspecified 09/17/2013   Anxiety state 09/17/2013   Aortic stenosis    post aortic valve replacement with St.Jude's valve by Dr.VanTrigt on 07-27-11   Aortic valve disorder 05/29/2013   Atherosclerotic heart disease of native coronary artery without angina pectoris 08/16/2017   Benign prostatic hyperplasia with lower urinary tract symptoms 08/16/2017   Bradycardia with less than 60 beats per minute 04/29/2021   Candidal stomatitis 08/16/2017   Cellulitis, unspecified 07/25/2019   Chronic airway obstruction ([place]) 04/21/2013   Chronic coronary artery disease 04/10/2016   Formatting of this note might be different from the original. Overview:  status post CABG x3 in 2008 Formatting of this note might be different from the original. Overview:  Overview:  status post CABG x3 in 2008   Chronic rhinitis 12/27/2019   Last Assessment & Plan:  Concern over his nose. Chronic recurring nasal congestion.  Had a severe episode recently that responded to over-the-counter medication.  Currently asymptomatic.  Wants to make sure all is okay. EXAM intranasally shows no polyps, purulence, masses or obstructing anatomy.  Mucosa is overall healthy. PLAN: Reassured all looks okay.  I think what he is doing is fine.  Happy t   Cognitive communication deficit 10/28/2020   Constipation 08/16/2017   Constipation, unspecified 08/16/2017   Coronary artery disease    status post CABG x3 in  2008   Diabetic polyneuropathy associated with type 2 diabetes mellitus ([place]) 10/04/2016   Disorder of the skin and subcutaneous tissue, unspecified 07/25/2019   Disorders of muscle in diseases classified elsewhere, multiple sites 10/28/2020   Disturbance of salivary secretion 04/21/2013   Dry mouth, unspecified 10/18/2018    Dyslipidemia    Dysphagia 04/21/2013   Encounter for current long-term use of anticoagulants 05/29/2013   Formatting of this note might be different from the original. IMO routine update Formatting of this note might be different from the original. Overview:  IMO routine update   Encounter for immunization 07/25/2019   Enlarged prostate with lower urinary tract symptoms (LUTS) 05/29/2013   Esophageal reflux 02/26/2014   Essential hypertension 01/19/2019   Fall 04/30/2021   Follow-up visit for aortic valve replacement with metallic valve 99991111   Foreign body aspiration 03/05/2016   Gastro-esophageal reflux disease without esophagitis 02/26/2014   Generalized edema 10/18/2018   Gout    Gouty arthropathy 05/29/2013   Hidrocystoma of left eyelid 12/10/2014   History of falling 10/28/2020   Hyperlipidemia, unspecified 11/11/2016   Hyperplasia of prostate    benign   Hypersomnia 06/14/2013   Hypertension    Hypertensive heart disease with heart failure ([place]) 10/18/2018   Hypertriglyceridemia 11/11/2016   Hypothyroidism    Hypothyroidism, unspecified 04/10/2016   Impacted cerumen, bilateral 08/16/2017   Impaired fasting glucose 04/21/2013   Iron deficiency anemia, unspecified 08/16/2017   Ischemic optic neuropathy of both eyes 01/28/2016   Localized edema 07/25/2019   Long term (current) use of anticoagulants 05/29/2013   Overview:  IMO routine update IMO routine update   Long term (current) use of aspirin 08/16/2017   Low back pain 08/16/2017   Low-tension glaucoma of both eyes, 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/2016   tmax 12/14 OCT 11/03/2016 VF 12/04/2015 Gonio 05/07/2015 tmax 12/14 OCT 11/03/2016 VF 12/04/2015 Gonio 05/07/2015   Low-tension glaucoma, bilateral, 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/2016   tmax 12/14 OCT 11/03/2016 VF 12/04/2015 Gonio 05/07/2015 tmax 12/14 OCT 11/03/2016 VF 12/04/2015 Gonio 05/07/2015   Lumbar stenosis 04/30/2015   Memory loss, short term 03/30/2016   Mild cognitive  impairment 08/30/2016   Mixed dyslipidemia 01/19/2019   Moderate persistent asthma without complication [id number]   Moderate persistent asthma, uncomplicated [id number]   Monitoring for long-term anticoagulant use 08/03/2017   Muscle weakness (generalized) 10/28/2020   Myocardial infarct ([place])    Nasal congestion 02/04/2018   Neuropathy 04/04/2014   Obstructive sleep apnea 12/17/2013   Obstructive sleep apnea of adult 12/17/2013   Old myocardial infarction 08/22/2019   Oral mucositis (ulcerative), unspecified 10/28/2020   Osteoarthrosis 04/21/2013   Other malaise 07/25/2019   Other specified disorders of nose and nasal sinuses 10/28/2020   Overweight 01/19/2019   Pain in thoracic spine 10/18/2018   Periodic limb movement disorder 04/30/2021   Periodic limb movements of sleep 08/26/2013   Presence of aortocoronary bypass graft 10/18/2018   Presence of prosthetic heart valve 10/18/2018   Pseudophakia of both eyes 12/10/2014   Psoriasis and similar disorder 01/28/2014   Rash and other nonspecific skin eruption 08/16/2017   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/28/2013   Restless legs syndrome 08/26/2013   Spells 08/26/2013   Spinal stenosis, lumbar region without neurogenic claudication 04/30/2015   Status post aortic valve replacement with metallic valve 99991111   Status post shoulder joint replacement    right shoulder   Subtherapeutic anticoagulation 04/10/2016   Syncope and collapse 06/14/2013   Overview:  with seizure like activity. with seizure like  activity.   Traumatic subarachnoid hemorrhage without loss of consciousness, subsequent encounter 10/28/2020   Type 2 diabetes mellitus with diabetic neuropathy, unspecified ([place]) 08/16/2017   Unspecified abnormalities of gait and mobility 10/18/2018   Unspecified osteoarthritis, unspecified site 04/21/2013   Unspecified urinary incontinence 05/17/2018   Urinary retention 07/18/2018   Vitamin D deficiency, unspecified 08/16/2017     Past Surgical History:  Procedure Laterality Date   AORTIC VALVE REPLACEMENT     CORONARY ARTERY BYPASS GRAFT     PROSTATE ABLATION     TONSILLECTOMY      Current Medications: Current Meds  Medication Sig   acetaminophen (TYLENOL) 650 MG CR tablet Take 650 mg by mouth in the morning and at bedtime.   albuterol (PROVENTIL HFA;VENTOLIN HFA) 108 (90 Base) MCG/ACT inhaler Inhale 2 puffs into the lungs every 6 (six) hours as needed for wheezing or shortness of breath.   allopurinol (ZYLOPRIM) 300 MG tablet Take 300 mg by mouth daily.   antiseptic oral rinse (BIOTENE) LIQD 30 mLs by Mouth Rinse route in the morning and at bedtime.   clonazePAM (KLONOPIN) 0.5 MG tablet Take 0.5 mg by mouth at bedtime.   Docusate Sodium (DSS) 100 MG CAPS Take 100 mg by mouth as needed for constipation.   ergocalciferol (VITAMIN D2) 1.25 MG (50000 UT) capsule Take 50,000 Units by mouth every Tuesday.   fenofibrate (TRICOR) 145 MG tablet Take 145 mg by mouth daily.   Fluticasone-Salmeterol (ADVAIR) 100-50 MCG/DOSE AEPB Inhale 2 puffs into the lungs 2 (two) times daily.   gabapentin (NEURONTIN) 100 MG capsule Take 100 mg by mouth 3 (three) times daily.   icosapent Ethyl (VASCEPA) 1 g capsule Take 2 capsules (2 g total) by mouth 2 (two) times daily.   latanoprost (XALATAN) 0.005 % ophthalmic solution Place 1 drop into both eyes at bedtime.   levothyroxine (SYNTHROID) 112 MCG tablet Take 112 mcg by mouth daily before breakfast. 6 days a week   levothyroxine (SYNTHROID) 200 MCG tablet Take 200 mcg by mouth every 7 (seven) days.   magic mouthwash (nystatin, hydrocortisone, diphenhydrAMINE) suspension Swish and spit 5 mLs 4 (four) times daily as needed for mouth pain.   nitroGLYCERIN (NITROSTAT) 0.4 MG SL tablet Place 1 tablet (0.4 mg total) under the tongue every 5 (five) minutes as needed for chest pain.   omeprazole (PRILOSEC) 20 MG capsule Take 20 mg by mouth 2 (two) times daily before a meal.   ondansetron  (ZOFRAN) 4 MG tablet Take 4 mg by mouth as needed for nausea or vomiting.   predniSONE (DELTASONE) 20 MG tablet Take 20 mg by mouth daily as needed (acute pain).   rOPINIRole (REQUIP) 0.5 MG tablet Take 0.5 mg by mouth 2 (two) times daily.   SLOW FE 142 (45 Fe) MG TBCR Take 45 mg by mouth daily.   sodium chloride (OCEAN) 0.65 % SOLN nasal spray Place 1 spray into both nostrils as needed for congestion.   spironolactone (ALDACTONE) 25 MG tablet Take 25 mg by mouth daily.   timolol (TIMOPTIC) 0.5 % ophthalmic solution Place 1 drop into both eyes 2 (two) times daily.   warfarin (COUMADIN) 3 MG tablet Take 3 mg by mouth 2 (two) times a week.   warfarin (COUMADIN) 4 MG tablet Take 4 mg by mouth as directed. 5 times weekly     Allergies:   Atorvastatin calcium, Dorzolamide, Brimonidine, Brinzolamide-brimonidine, Lipitor [atorvastatin calcium], Rotigotine, Tamsulosin, Vancomycin, Zolpidem, and Dutasteride   Social History   Socioeconomic History  Marital status: Married    Spouse name: Not on file   Number of children: Not on file   Years of education: Not on file   Highest education level: Not on file  Occupational History   Not on file  Tobacco Use   Smoking status: Former   Smokeless tobacco: Never  Substance and Sexual Activity   Alcohol use: No   Drug use: No   Sexual activity: Not on file  Other Topics Concern   Not on file  Social History Narrative   Not on file   Social Determinants of Health   Financial Resource Strain: Not on file  Food Insecurity: Not on file  Transportation Needs: Not on file  Physical Activity: Not on file  Stress: Not on file  Social Connections: Not on file     Family History: The patient's family history is negative for Hypertension, Diabetes, Cancer, and Heart disease.  ROS:   Please see the history of present illness.    All other systems reviewed and are negative.  EKGs/Labs/Other Studies Reviewed:    The following studies were  reviewed today: EKG reveals sinus rhythm and nonspecific ST-T changes.  Left axis deviation and right bundle branch block and LVH.   Recent Labs: 04/29/2021: Magnesium 2.0; TSH 0.965 04/30/2021: ALT 17 05/04/2021: BUN 31; Creatinine, Ser 1.15; Hemoglobin 11.6; Platelets 174; Potassium 4.3; Sodium 138  Recent Lipid Panel    Component Value Date/Time   CHOL 242 (H) 04/15/2021 1614   TRIG 782 (HH) 04/15/2021 1614   HDL 19 (L) 04/15/2021 1614   CHOLHDL 12.7 (H) 04/15/2021 1614   CHOLHDL 8.5 04/11/2016 0300   VLDL UNABLE TO CALCULATE IF TRIGLYCERIDE OVER 400 mg/dL 04/11/2016 0300   LDLCALC 91 04/15/2021 1614    Physical Exam:    VS:  BP (!) 152/64   Pulse 76   Ht '5\' 8"'$  (1.727 m)   Wt 206 lb 1.3 oz (93.5 kg)   SpO2 97%   BMI 31.33 kg/m     Wt Readings from Last 3 Encounters:  06/08/21 206 lb 1.3 oz (93.5 kg)  05/04/21 220 lb (99.8 kg)  04/30/21 204 lb 12.9 oz (92.9 kg)     GEN: Patient is in no acute distress HEENT: Normal NECK: No JVD; No carotid bruits LYMPHATICS: No lymphadenopathy CARDIAC: Hear sounds regular, 2/6 systolic murmur at the apex. RESPIRATORY:  Clear to auscultation without rales, wheezing or rhonchi  ABDOMEN: Soft, non-tender, non-distended MUSCULOSKELETAL:  No edema; No deformity  SKIN: Warm and dry NEUROLOGIC:  Alert and oriented x 3 PSYCHIATRIC:  Normal affect   Signed, Jenean Lindau, MD  06/08/2021 3:58 PM    [facility]

## 2021-06-08 NOTE — Addendum Note (Signed)
Addended by: Lino Wickliff, Jonelle Sidle L on: 06/08/2021 04:10 PM   Modules accepted: Orders

## 2021-06-18 ENCOUNTER — Telehealth: Payer: Self-pay | Admitting: Cardiology

## 2021-06-18 NOTE — Telephone Encounter (Signed)
Pt c/o BP issue: STAT if pt c/o blurred vision, one-sided weakness or slurred speech  1. What are your last 5 BP readings?  06/08/21 6:13 am: 175/83  06/09/21 6:35 am: 175/83 06/16/21  4:20 pm: 212/103 5:00 pm: 220/118 *Pt given one time dose of Hydralazine ordered by On Call MD around 6:00 pm 7:20 pm: 207/99  8:00 pm: 131/65  06/17/21 10:34 pm: 123/68 06/18/21 7:30 am: 200/101 06/18/21 12:30 am: 133/57  2. Are you having any other symptoms (ex. Dizziness, headache, blurred vision, passed out)? No. Patient is Asymptomatic when these BP readings are high  3. What is your BP issue? Patient's Nurse from [facility] called to report these readings.  She wanted to know if the patient needed to have his medication adjusted pr be put on something PRN to lower BP readings. She also faxed this information over to the [facility] but gave information over the phone in case fax got lost

## 2021-06-19 NOTE — Telephone Encounter (Signed)
Spoke to Baxter Flattery just now and gave her the verbal order for the hydralazine per Dr. Julien Nordmann recommendation. She verbalizes understanding and thanks me for calling back.

## 2021-09-02 ENCOUNTER — Other Ambulatory Visit: Payer: Self-pay

## 2021-09-17 ENCOUNTER — Ambulatory Visit: Payer: Medicare Other | Admitting: Cardiology

## 2021-10-05 ENCOUNTER — Emergency Department (HOSPITAL_COMMUNITY): Payer: Medicare Other

## 2021-10-05 ENCOUNTER — Inpatient Hospital Stay (HOSPITAL_COMMUNITY)
Admission: EM | Admit: 2021-10-05 | Discharge: 2021-10-08 | DRG: 871 | Disposition: A | Discharge status: Skilled Nursing Facility | Payer: Medicare Other | Source: Skilled Nursing Facility | Attending: Family Medicine | Admitting: Family Medicine

## 2021-10-05 ENCOUNTER — Other Ambulatory Visit: Payer: Self-pay

## 2021-10-05 ENCOUNTER — Encounter (HOSPITAL_COMMUNITY): Payer: Self-pay | Admitting: Emergency Medicine

## 2021-10-05 DIAGNOSIS — R6520 Severe sepsis without septic shock: Secondary | ICD-10-CM | POA: Diagnosis present

## 2021-10-05 DIAGNOSIS — R7989 Other specified abnormal findings of blood chemistry: Secondary | ICD-10-CM | POA: Diagnosis not present

## 2021-10-05 DIAGNOSIS — Z87891 Personal history of nicotine dependence: Secondary | ICD-10-CM

## 2021-10-05 DIAGNOSIS — Z7951 Long term (current) use of inhaled steroids: Secondary | ICD-10-CM

## 2021-10-05 DIAGNOSIS — E1142 Type 2 diabetes mellitus with diabetic polyneuropathy: Secondary | ICD-10-CM | POA: Diagnosis present

## 2021-10-05 DIAGNOSIS — Z9181 History of falling: Secondary | ICD-10-CM

## 2021-10-05 DIAGNOSIS — E039 Hypothyroidism, unspecified: Secondary | ICD-10-CM | POA: Diagnosis present

## 2021-10-05 DIAGNOSIS — A419 Sepsis, unspecified organism: Secondary | ICD-10-CM

## 2021-10-05 DIAGNOSIS — K219 Gastro-esophageal reflux disease without esophagitis: Secondary | ICD-10-CM | POA: Diagnosis present

## 2021-10-05 DIAGNOSIS — E876 Hypokalemia: Secondary | ICD-10-CM | POA: Diagnosis present

## 2021-10-05 DIAGNOSIS — G4733 Obstructive sleep apnea (adult) (pediatric): Secondary | ICD-10-CM | POA: Diagnosis present

## 2021-10-05 DIAGNOSIS — A4151 Sepsis due to Escherichia coli [E. coli]: Principal | ICD-10-CM | POA: Diagnosis present

## 2021-10-05 DIAGNOSIS — R778 Other specified abnormalities of plasma proteins: Secondary | ICD-10-CM | POA: Diagnosis present

## 2021-10-05 DIAGNOSIS — D631 Anemia in chronic kidney disease: Secondary | ICD-10-CM | POA: Diagnosis present

## 2021-10-05 DIAGNOSIS — E782 Mixed hyperlipidemia: Secondary | ICD-10-CM | POA: Diagnosis present

## 2021-10-05 DIAGNOSIS — Z7901 Long term (current) use of anticoagulants: Secondary | ICD-10-CM

## 2021-10-05 DIAGNOSIS — I4510 Unspecified right bundle-branch block: Secondary | ICD-10-CM | POA: Diagnosis present

## 2021-10-05 DIAGNOSIS — E1122 Type 2 diabetes mellitus with diabetic chronic kidney disease: Secondary | ICD-10-CM | POA: Diagnosis present

## 2021-10-05 DIAGNOSIS — Z881 Allergy status to other antibiotic agents status: Secondary | ICD-10-CM

## 2021-10-05 DIAGNOSIS — Z79899 Other long term (current) drug therapy: Secondary | ICD-10-CM

## 2021-10-05 DIAGNOSIS — Z9861 Coronary angioplasty status: Secondary | ICD-10-CM

## 2021-10-05 DIAGNOSIS — R7402 Elevation of levels of lactic acid dehydrogenase (LDH): Secondary | ICD-10-CM | POA: Diagnosis present

## 2021-10-05 DIAGNOSIS — Z79890 Hormone replacement therapy (postmenopausal): Secondary | ICD-10-CM

## 2021-10-05 DIAGNOSIS — Z952 Presence of prosthetic heart valve: Secondary | ICD-10-CM

## 2021-10-05 DIAGNOSIS — I252 Old myocardial infarction: Secondary | ICD-10-CM

## 2021-10-05 DIAGNOSIS — K529 Noninfective gastroenteritis and colitis, unspecified: Secondary | ICD-10-CM | POA: Diagnosis present

## 2021-10-05 DIAGNOSIS — F03A4 Unspecified dementia, mild, with anxiety: Secondary | ICD-10-CM | POA: Diagnosis present

## 2021-10-05 DIAGNOSIS — Z888 Allergy status to other drugs, medicaments and biological substances status: Secondary | ICD-10-CM

## 2021-10-05 DIAGNOSIS — Z8616 Personal history of COVID-19: Secondary | ICD-10-CM

## 2021-10-05 DIAGNOSIS — G9341 Metabolic encephalopathy: Secondary | ICD-10-CM | POA: Diagnosis present

## 2021-10-05 DIAGNOSIS — U071 COVID-19: Secondary | ICD-10-CM

## 2021-10-05 DIAGNOSIS — F411 Generalized anxiety disorder: Secondary | ICD-10-CM | POA: Diagnosis present

## 2021-10-05 DIAGNOSIS — N1831 Chronic kidney disease, stage 3a: Secondary | ICD-10-CM | POA: Diagnosis present

## 2021-10-05 DIAGNOSIS — M109 Gout, unspecified: Secondary | ICD-10-CM | POA: Diagnosis present

## 2021-10-05 DIAGNOSIS — E860 Dehydration: Secondary | ICD-10-CM | POA: Diagnosis present

## 2021-10-05 DIAGNOSIS — G2581 Restless legs syndrome: Secondary | ICD-10-CM | POA: Diagnosis present

## 2021-10-05 DIAGNOSIS — R1110 Vomiting, unspecified: Secondary | ICD-10-CM

## 2021-10-05 DIAGNOSIS — I052 Rheumatic mitral stenosis with insufficiency: Secondary | ICD-10-CM | POA: Diagnosis present

## 2021-10-05 DIAGNOSIS — I214 Non-ST elevation (NSTEMI) myocardial infarction: Secondary | ICD-10-CM

## 2021-10-05 DIAGNOSIS — I2510 Atherosclerotic heart disease of native coronary artery without angina pectoris: Secondary | ICD-10-CM | POA: Diagnosis present

## 2021-10-05 DIAGNOSIS — I129 Hypertensive chronic kidney disease with stage 1 through stage 4 chronic kidney disease, or unspecified chronic kidney disease: Secondary | ICD-10-CM | POA: Diagnosis present

## 2021-10-05 DIAGNOSIS — Z66 Do not resuscitate: Secondary | ICD-10-CM | POA: Diagnosis present

## 2021-10-05 LAB — FIBRINOGEN: Fibrinogen: 346 mg/dL (ref 210–475)

## 2021-10-05 LAB — COMPREHENSIVE METABOLIC PANEL
ALT: 70 U/L — ABNORMAL HIGH (ref 0–44)
AST: 146 U/L — ABNORMAL HIGH (ref 15–41)
Albumin: 3.4 g/dL — ABNORMAL LOW (ref 3.5–5.0)
Alkaline Phosphatase: 47 U/L (ref 38–126)
Anion gap: 13 (ref 5–15)
BUN: 21 mg/dL (ref 8–23)
CO2: 23 mmol/L (ref 22–32)
Calcium: 9.6 mg/dL (ref 8.9–10.3)
Chloride: 99 mmol/L (ref 98–111)
Creatinine, Ser: 1.21 mg/dL (ref 0.61–1.24)
GFR, Estimated: 55 mL/min — ABNORMAL LOW (ref >60)
Glucose, Bld: 143 mg/dL — ABNORMAL HIGH (ref 70–99)
Potassium: 3.1 mmol/L — ABNORMAL LOW (ref 3.5–5.1)
Sodium: 135 mmol/L (ref 135–145)
Total Bilirubin: 4.3 mg/dL — ABNORMAL HIGH (ref 0.3–1.2)
Total Protein: 6.8 g/dL (ref 6.5–8.1)

## 2021-10-05 LAB — CBC WITH DIFFERENTIAL/PLATELET
Abs Immature Granulocytes: 0.07 10*3/uL (ref 0.00–0.07)
Basophils Absolute: 0 10*3/uL (ref 0.0–0.1)
Basophils Relative: 0 %
Eosinophils Absolute: 0 10*3/uL (ref 0.0–0.5)
Eosinophils Relative: 0 %
HCT: 35.7 % — ABNORMAL LOW (ref 39.0–52.0)
Hemoglobin: 12.3 g/dL — ABNORMAL LOW (ref 13.0–17.0)
Immature Granulocytes: 1 %
Lymphocytes Relative: 3 %
Lymphs Abs: 0.3 10*3/uL — ABNORMAL LOW (ref 0.7–4.0)
MCH: 32.7 pg (ref 26.0–34.0)
MCHC: 34.5 g/dL (ref 30.0–36.0)
MCV: 94.9 fL (ref 80.0–100.0)
Monocytes Absolute: 0.6 10*3/uL (ref 0.1–1.0)
Monocytes Relative: 5 %
Neutro Abs: 10.6 10*3/uL — ABNORMAL HIGH (ref 1.7–7.7)
Neutrophils Relative %: 91 %
Platelets: 162 10*3/uL (ref 150–400)
RBC: 3.76 MIL/uL — ABNORMAL LOW (ref 4.22–5.81)
RDW: 14.3 % (ref 11.5–15.5)
WBC: 11.6 10*3/uL — ABNORMAL HIGH (ref 4.0–10.5)
nRBC: 0 % (ref 0.0–0.2)

## 2021-10-05 LAB — PROCALCITONIN: Procalcitonin: 20.18 ng/mL

## 2021-10-05 LAB — RESP PANEL BY RT-PCR (FLU A&B, COVID) ARPGX2
Influenza A by PCR: NEGATIVE
Influenza B by PCR: NEGATIVE
SARS Coronavirus 2 by RT PCR: POSITIVE — AB

## 2021-10-05 LAB — APTT: aPTT: 44 seconds — ABNORMAL HIGH (ref 24–36)

## 2021-10-05 LAB — FERRITIN: Ferritin: 1026 ng/mL — ABNORMAL HIGH (ref 24–336)

## 2021-10-05 LAB — PROTIME-INR
INR: 3.3 — ABNORMAL HIGH (ref 0.8–1.2)
Prothrombin Time: 33.7 seconds — ABNORMAL HIGH (ref 11.4–15.2)

## 2021-10-05 LAB — LACTATE DEHYDROGENASE: LDH: 316 U/L — ABNORMAL HIGH (ref 98–192)

## 2021-10-05 LAB — URINALYSIS, ROUTINE W REFLEX MICROSCOPIC
Bilirubin Urine: NEGATIVE
Glucose, UA: NEGATIVE mg/dL
Hgb urine dipstick: NEGATIVE
Ketones, ur: NEGATIVE mg/dL
Leukocytes,Ua: NEGATIVE
Nitrite: NEGATIVE
Protein, ur: NEGATIVE mg/dL
Specific Gravity, Urine: 1.013 (ref 1.005–1.030)
pH: 7 (ref 5.0–8.0)

## 2021-10-05 LAB — BRAIN NATRIURETIC PEPTIDE: B Natriuretic Peptide: 427.9 pg/mL — ABNORMAL HIGH (ref 0.0–100.0)

## 2021-10-05 LAB — TROPONIN I (HIGH SENSITIVITY)
Troponin I (High Sensitivity): 146 ng/L (ref <18)
Troponin I (High Sensitivity): 550 ng/L (ref <18)

## 2021-10-05 LAB — BILIRUBIN, DIRECT: Bilirubin, Direct: 2.6 mg/dL — ABNORMAL HIGH (ref 0.0–0.2)

## 2021-10-05 LAB — LACTIC ACID, PLASMA
Lactic Acid, Venous: 3.6 mmol/L (ref 0.5–1.9)
Lactic Acid, Venous: 3.9 mmol/L (ref 0.5–1.9)

## 2021-10-05 LAB — SEDIMENTATION RATE: Sed Rate: 35 mm/hr — ABNORMAL HIGH (ref 0–16)

## 2021-10-05 LAB — C-REACTIVE PROTEIN: CRP: 3.5 mg/dL — ABNORMAL HIGH (ref <1.0)

## 2021-10-05 LAB — D-DIMER, QUANTITATIVE: D-Dimer, Quant: 0.88 ug/mL-FEU — ABNORMAL HIGH (ref 0.00–0.50)

## 2021-10-05 LAB — MAGNESIUM: Magnesium: 1.4 mg/dL — ABNORMAL LOW (ref 1.7–2.4)

## 2021-10-05 MED ORDER — IOHEXOL 350 MG/ML SOLN
100.0000 mL | Freq: Once | INTRAVENOUS | Status: AC | PRN
  Administered 2021-10-05: 100 mL via INTRAVENOUS

## 2021-10-05 MED ORDER — MAGNESIUM OXIDE -MG SUPPLEMENT 400 (240 MG) MG PO TABS
400.0000 mg | ORAL_TABLET | Freq: Once | ORAL | Status: AC
  Administered 2021-10-06: 400 mg via ORAL
  Filled 2021-10-05: qty 1

## 2021-10-05 MED ORDER — LACTATED RINGERS IV BOLUS (SEPSIS)
1000.0000 mL | Freq: Once | INTRAVENOUS | Status: AC
  Administered 2021-10-05: 1000 mL via INTRAVENOUS

## 2021-10-05 MED ORDER — SODIUM CHLORIDE 0.9 % IV SOLN
2.0000 g | INTRAVENOUS | Status: DC
  Administered 2021-10-05: 2 g via INTRAVENOUS
  Filled 2021-10-05: qty 20

## 2021-10-05 MED ORDER — POTASSIUM CHLORIDE CRYS ER 20 MEQ PO TBCR
20.0000 meq | EXTENDED_RELEASE_TABLET | Freq: Once | ORAL | Status: AC
  Administered 2021-10-06: 20 meq via ORAL
  Filled 2021-10-05: qty 1

## 2021-10-05 MED ORDER — LACTATED RINGERS IV SOLN: INTRAVENOUS | Status: DC

## 2021-10-05 MED ORDER — MAGNESIUM SULFATE 2 GM/50ML IV SOLN
2.0000 g | Freq: Once | INTRAVENOUS | Status: AC
  Administered 2021-10-06: 2 g via INTRAVENOUS
  Filled 2021-10-05: qty 50

## 2021-10-05 MED ORDER — ACETAMINOPHEN 500 MG PO TABS
1000.0000 mg | ORAL_TABLET | Freq: Once | ORAL | Status: AC
  Administered 2021-10-05: 1000 mg via ORAL
  Filled 2021-10-05: qty 2

## 2021-10-05 MED ORDER — ASPIRIN 325 MG PO TABS
325.0000 mg | ORAL_TABLET | Freq: Every day | ORAL | Status: DC
  Administered 2021-10-05 – 2021-10-06 (×2): 325 mg via ORAL
  Filled 2021-10-05 (×3): qty 1

## 2021-10-05 MED ORDER — SODIUM CHLORIDE 0.9 % IV SOLN
500.0000 mg | INTRAVENOUS | Status: DC
  Administered 2021-10-05: 500 mg via INTRAVENOUS
  Filled 2021-10-05: qty 500

## 2021-10-05 NOTE — ED Provider Notes (Signed)
[facility] Provider Note   CSN: [id number] Arrival date & time: 10/05/21  1848     History Chief Complaint  Patient presents with  . Fever  . Weakness    [facility] is a 87 y.o. male.   Fever Associated symptoms: chills, nausea and vomiting   Weakness Associated symptoms: fever, nausea and vomiting     87 year old male with extensive medical history below presenting with one day of chills and malaise, decreased oral intake for the past day. Symptoms came on suddenly today. Episode of vomiting today. Febrile today at his facility with subjective warmth. Subsequent episode of diarrhea today. Very weak and fatigued today. Recently had COVID 2 weeks ago. Level 5 caveat due to patient dementia.   Past Medical History:  Diagnosis Date  . Acute kidney failure, unspecified ([place]) 07/22/2019  . Acute maxillary sinusitis, unspecified 08/16/2017  . Allergic rhinitis, unspecified 02/04/2018  . Anxiety disorder, unspecified 09/17/2013  . Anxiety state 09/17/2013  . Aortic stenosis    post aortic valve replacement with [employer].Jude's valve by Dr.VanTrigt on 07-27-11  . Aortic valve disorder 05/29/2013  . Atherosclerotic heart disease of native coronary artery without angina pectoris 08/16/2017  . Benign prostatic hyperplasia with lower urinary tract symptoms 08/16/2017  . Bradycardia with less than 60 beats per minute 04/29/2021  . Candidal stomatitis 08/16/2017  . Cellulitis, unspecified 07/25/2019  . Chronic airway obstruction ([place]) 04/21/2013  . Chronic coronary artery disease 04/10/2016   Formatting of this note might be different from the original. Overview:  status post CABG x3 in 2008 Formatting of this note might be different from the original. Overview:  Overview:  status post CABG x3 in 2008  . Chronic rhinitis 12/27/2019   Last Assessment & Plan:  Concern over his nose. Chronic recurring nasal congestion.  Had a severe episode  recently that responded to over-the-counter medication.  Currently asymptomatic.  Wants to make sure all is okay. EXAM intranasally shows no polyps, purulence, masses or obstructing anatomy.  Mucosa is overall healthy. PLAN: Reassured all looks okay.  I think what he is doing is fine.  Happy t  . Cognitive communication deficit 10/28/2020  . Constipation 08/16/2017  . Constipation, unspecified 08/16/2017  . Coronary artery disease    status post CABG x3 in 2008  . Diabetic polyneuropathy associated with type 2 diabetes mellitus ([place]) 10/04/2016  . Disorder of the skin and subcutaneous tissue, unspecified 07/25/2019  . Disorders of muscle in diseases classified elsewhere, multiple sites 10/28/2020  . Disturbance of salivary secretion 04/21/2013  . Dry mouth, unspecified 10/18/2018  . Dyslipidemia   . Dysphagia 04/21/2013  . Encounter for current long-term use of anticoagulants 05/29/2013   Formatting of this note might be different from the original. IMO routine update Formatting of this note might be different from the original. Overview:  IMO routine update  . Encounter for immunization 07/25/2019  . Enlarged prostate with lower urinary tract symptoms (LUTS) 05/29/2013  . Esophageal reflux 02/26/2014  . Essential hypertension 01/19/2019  . Fall 04/30/2021  . Follow-up visit for aortic valve replacement with metallic valve 24/23/5361  . Foreign body aspiration 03/05/2016  . Gastro-esophageal reflux disease without esophagitis 02/26/2014  . Generalized edema 10/18/2018  . Gout   . Gouty arthropathy 05/29/2013  . Hidrocystoma of left eyelid 12/10/2014  . History of falling 10/28/2020  . Hyperlipidemia, unspecified 11/11/2016  . Hyperplasia of prostate    benign  . Hypersomnia 06/14/2013  .  Hypertension   . Hypertensive heart disease with heart failure ([place]) 10/18/2018  . Hypertriglyceridemia 11/11/2016  . Hypothyroidism   . Hypothyroidism, unspecified 04/10/2016  . Impacted cerumen,  bilateral 08/16/2017  . Impaired fasting glucose 04/21/2013  . Iron deficiency anemia, unspecified 08/16/2017  . Ischemic optic neuropathy of both eyes 01/28/2016  . Localized edema 07/25/2019  . Long term (current) use of anticoagulants 05/29/2013   Overview:  IMO routine update IMO routine update  . Long term (current) use of aspirin 08/16/2017  . Low back pain 08/16/2017  . Low-tension glaucoma of both eyes, 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/2016   tmax 12/14 OCT 11/03/2016 VF 12/04/2015 Gonio 05/07/2015 tmax 12/14 OCT 11/03/2016 VF 12/04/2015 Gonio 05/07/2015  . Low-tension glaucoma, bilateral, 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/2016   tmax 12/14 OCT 11/03/2016 VF 12/04/2015 Gonio 05/07/2015 tmax 12/14 OCT 11/03/2016 VF 12/04/2015 Gonio 05/07/2015  . Lumbar stenosis 04/30/2015  . Memory loss, short term 03/30/2016  . Mild cognitive impairment 08/30/2016  . Mixed dyslipidemia 01/19/2019  . Moderate persistent asthma without complication 11/94/1740  . Moderate persistent asthma, uncomplicated 81/44/8185  . Monitoring for long-term anticoagulant use 08/03/2017  . Muscle weakness (generalized) 10/28/2020  . Myocardial infarct ([place])   . Nasal congestion 02/04/2018  . Neuropathy 04/04/2014  . Obstructive sleep apnea 12/17/2013  . Obstructive sleep apnea of adult 12/17/2013  . Old myocardial infarction 08/22/2019  . Oral mucositis (ulcerative), unspecified 10/28/2020  . Osteoarthrosis 04/21/2013  . Other malaise 07/25/2019  . Other specified disorders of nose and nasal sinuses 10/28/2020  . Overweight 01/19/2019  . Pain in thoracic spine 10/18/2018  . Periodic limb movement disorder 04/30/2021  . Periodic limb movements of sleep 08/26/2013  . Presence of aortocoronary bypass graft 10/18/2018  . Presence of prosthetic heart valve 10/18/2018  . Pruritus, unspecified 11/10/2020  . Pseudophakia of both eyes 12/10/2014  . Psoriasis and similar disorder 01/28/2014  . Rash and other nonspecific skin eruption  08/16/2017  . 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/28/2013  . Restless legs syndrome 08/26/2013  . Spells 08/26/2013  . Spinal stenosis, lumbar region without neurogenic claudication 04/30/2015  . Status post aortic valve replacement with metallic valve 63/14/9702  . Status post shoulder joint replacement    right shoulder  . Subtherapeutic anticoagulation 04/10/2016  . Syncope and collapse 06/14/2013   Overview:  with seizure like activity. with seizure like activity.  . Traumatic subarachnoid hemorrhage without loss of consciousness, subsequent encounter 10/28/2020  . Type 2 diabetes mellitus with diabetic neuropathy, unspecified ([place]) 08/16/2017  . Unspecified abnormalities of gait and mobility 10/18/2018  . Unspecified jaundice 11/10/2020  . Unspecified osteoarthritis, unspecified site 04/21/2013  . Unspecified urinary incontinence 05/17/2018  . Urinary retention 07/18/2018  . Vitamin D deficiency, unspecified 08/16/2017    Patient Active Problem List   Diagnosis Date Noted  . Sepsis ([place]) 10/06/2021  . Fall 04/30/2021  . Periodic limb movement disorder 04/30/2021  . Bradycardia with less than 60 beats per minute 04/29/2021  . Gout   . Hyperplasia of prostate   . Hypothyroidism   . Myocardial infarct ([place])   . Pruritus, unspecified 11/10/2020  . Unspecified jaundice 11/10/2020  . COVID-19 11/10/2020  . Cognitive communication deficit 10/28/2020  . Disorders of muscle in diseases classified elsewhere, multiple sites 10/28/2020  . History of falling 10/28/2020  . Muscle weakness (generalized) 10/28/2020  . Oral mucositis (ulcerative), unspecified 10/28/2020  . Other specified disorders of nose and nasal sinuses 10/28/2020  . Traumatic subarachnoid hemorrhage without loss of consciousness, subsequent encounter  10/28/2020  . Aortic stenosis   . Hypertension   . Status post shoulder joint replacement   . Chronic rhinitis 12/27/2019  . Old myocardial infarction 08/22/2019  .  Other malaise 07/25/2019  . Cellulitis, unspecified 07/25/2019  . Disorder of the skin and subcutaneous tissue, unspecified 07/25/2019  . Encounter for immunization 07/25/2019  . Localized edema 07/25/2019  . Acute kidney failure, unspecified ([place]) 07/22/2019  . Status post aortic valve replacement with metallic valve 20/08/711  . Essential hypertension 01/19/2019  . Mixed dyslipidemia 01/19/2019  . Follow-up visit for aortic valve replacement with metallic valve 19/75/8832  . Overweight 01/19/2019  . Unspecified abnormalities of gait and mobility 10/18/2018  . Presence of prosthetic heart valve 10/18/2018  . Presence of aortocoronary bypass graft 10/18/2018  . Pain in thoracic spine 10/18/2018  . Generalized edema 10/18/2018  . Hypertensive heart disease with heart failure ([place]) 10/18/2018  . Dry mouth, unspecified 10/18/2018  . Urinary retention 07/18/2018  . Unspecified urinary incontinence 05/17/2018  . Nasal congestion 02/04/2018  . Allergic rhinitis, unspecified 02/04/2018  . Vitamin D deficiency, unspecified 08/16/2017  . Type 2 diabetes mellitus with diabetic neuropathy, unspecified ([place]) 08/16/2017  . Rash and other nonspecific skin eruption 08/16/2017  . Low back pain 08/16/2017  . Long term (current) use of aspirin 08/16/2017  . Iron deficiency anemia, unspecified 08/16/2017  . Impacted cerumen, bilateral 08/16/2017  . Acute maxillary sinusitis, unspecified 08/16/2017  . Candidal stomatitis 08/16/2017  . Constipation, unspecified 08/16/2017  . Atherosclerotic heart disease of native coronary artery without angina pectoris 08/16/2017  . Benign prostatic hyperplasia with lower urinary tract symptoms 08/16/2017  . Constipation 08/16/2017  . Monitoring for long-term anticoagulant use 08/03/2017  . Hyperlipidemia, unspecified 11/11/2016  . Hypertriglyceridemia 11/11/2016  . Diabetic polyneuropathy associated with type 2 diabetes mellitus ([place]) 10/04/2016  . Mild  cognitive impairment 08/30/2016  . Coronary artery disease 04/10/2016  . Hypothyroidism, unspecified 04/10/2016  . Dyslipidemia 04/10/2016  . Subtherapeutic anticoagulation 04/10/2016  . Chronic coronary artery disease 04/10/2016  . Memory loss, short term 03/30/2016  . Foreign body aspiration 03/05/2016  . Moderate persistent asthma, uncomplicated 54/98/2641  . Moderate persistent asthma without complication 58/30/9407  . Ischemic optic neuropathy of both eyes 01/28/2016  . Low-tension glaucoma, bilateral, 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/2016  . Low-tension glaucoma of both eyes, 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/2016  . Spinal stenosis, lumbar region without neurogenic claudication 04/30/2015  . Lumbar stenosis 04/30/2015  . Hidrocystoma of left eyelid 12/10/2014  . Pseudophakia of both eyes 12/10/2014  . Neuropathy 04/04/2014  . Gastro-esophageal reflux disease without esophagitis 02/26/2014  . Esophageal reflux 02/26/2014  . Psoriasis and similar disorder 01/28/2014  . Obstructive sleep apnea 12/17/2013  . Obstructive sleep apnea of adult 12/17/2013  . 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/28/2013  . Anxiety disorder, unspecified 09/17/2013  . Anxiety state 09/17/2013  . Restless legs syndrome 08/26/2013  . Spells 08/26/2013  . Periodic limb movements of sleep 08/26/2013  . Hypersomnia 06/14/2013  . Syncope and collapse 06/14/2013  . Aortic valve disorder 05/29/2013  . Enlarged prostate with lower urinary tract symptoms (LUTS) 05/29/2013  . Gouty arthropathy 05/29/2013  . Long term (current) use of anticoagulants 05/29/2013  . Encounter for current long-term use of anticoagulants 05/29/2013  . Chronic airway obstruction ([place]) 04/21/2013  . Disturbance of salivary secretion 04/21/2013  . Dysphagia 04/21/2013  . Impaired fasting glucose 04/21/2013  . Unspecified osteoarthritis, unspecified site 04/21/2013  . Osteoarthrosis 04/21/2013    Past Surgical History:  Procedure Laterality Date  .  AORTIC  VALVE REPLACEMENT    . CORONARY ARTERY BYPASS GRAFT    . PROSTATE ABLATION    . TONSILLECTOMY         Family History  Problem Relation Age of Onset  . Hypertension Neg Hx   . Diabetes Neg Hx   . Cancer Neg Hx   . Heart disease Neg Hx     Social History   Tobacco Use  . Smoking status: Former  . Smokeless tobacco: Never  Substance Use Topics  . Alcohol use: No  . Drug use: No    Home Medications Prior to Admission medications   Medication Sig Start Date End Date Taking? Authorizing Provider  acetaminophen (TYLENOL) 650 MG CR tablet Take 650 mg by mouth in the morning and at bedtime.    [provider]  albuterol (PROVENTIL HFA;VENTOLIN HFA) 108 (90 Base) MCG/ACT inhaler Inhale 2 puffs into the lungs every 6 (six) hours as needed for wheezing or shortness of breath.    [provider]  allopurinol (ZYLOPRIM) 300 MG tablet Take 300 mg by mouth daily.    [provider]  antiseptic oral rinse (BIOTENE) LIQD 30 mLs by Mouth Rinse route in the morning and at bedtime.    [provider]  clonazePAM (KLONOPIN) 0.5 MG tablet Take 0.5 mg by mouth at bedtime.    [provider]  Docusate Sodium (DSS) 100 MG CAPS Take 100 mg by mouth as needed for constipation.    [provider]  ergocalciferol (VITAMIN D2) 1.25 MG (50000 UT) capsule Take 50,000 Units by mouth every Tuesday.    [provider]  fenofibrate (TRICOR) 145 MG tablet Take 145 mg by mouth daily.    [provider]  Fluticasone-Salmeterol (ADVAIR) 100-50 MCG/DOSE AEPB Inhale 2 puffs into the lungs 2 (two) times daily.    [provider]  gabapentin (NEURONTIN) 100 MG capsule Take 100 mg by mouth 3 (three) times daily.    [provider]  icosapent Ethyl (VASCEPA) 1 g capsule Take 2 capsules (2 g total) by mouth 2 (two) times daily. 04/23/21   Revankar, Reita Cliche, MD  latanoprost (XALATAN) 0.005 % ophthalmic solution Place 1 drop into both eyes  at bedtime.    [provider]  levothyroxine (SYNTHROID) 112 MCG tablet Take 112 mcg by mouth as directed. 6 days a week    [provider]  levothyroxine (SYNTHROID) 200 MCG tablet Take 200 mcg by mouth every 7 (seven) days.    [provider]  magic mouthwash (nystatin, hydrocortisone, diphenhydrAMINE) suspension Swish and spit 5 mLs 4 (four) times daily as needed for mouth pain.    [provider]  nitroGLYCERIN (NITROSTAT) 0.4 MG SL tablet Place 0.4 mg under the tongue every 5 (five) minutes as needed for chest pain.    [provider]  omeprazole (PRILOSEC) 20 MG capsule Take 20 mg by mouth 2 (two) times daily before a meal.    [provider]  ondansetron (ZOFRAN) 4 MG tablet Take 4 mg by mouth as needed for nausea or vomiting.    [provider]  rOPINIRole (REQUIP) 0.5 MG tablet Take 0.5 mg by mouth 2 (two) times daily. 05/19/21   [provider]  rOPINIRole (REQUIP) 1 MG tablet Take 1 mg by mouth daily. 08/03/21   [provider]  SLOW FE 142 (45 Fe) MG TBCR Take 45 mg by mouth daily. 04/01/21   [provider]  sodium chloride (OCEAN) 0.65 %  SOLN nasal spray Place 1 spray into both nostrils as needed for congestion.    [provider]  spironolactone (ALDACTONE) 25 MG tablet Take 25 mg by mouth daily.    [provider]  timolol (TIMOPTIC) 0.5 % ophthalmic solution Place 1 drop into both eyes 2 (two) times daily. 12/24/19   [provider]  warfarin (COUMADIN) 3 MG tablet Take 3 mg by mouth 2 (two) times a week.    [provider]  warfarin (COUMADIN) 4 MG tablet Take 4 mg by mouth as directed. 5 times weekly    [provider]  warfarin (COUMADIN) 5 MG tablet Take 5 mg by mouth daily. 08/05/21   [provider]    Allergies    Atorvastatin calcium, Dorzolamide, Brimonidine, Brinzolamide-brimonidine, Lipitor [atorvastatin calcium], Rotigotine,  Tamsulosin, Vancomycin, Zolpidem, and Dutasteride  Review of Systems   Review of Systems  Unable to perform ROS: Dementia  Constitutional:  Positive for appetite change, chills, fatigue and fever.  Gastrointestinal:  Positive for nausea and vomiting.  Neurological:  Positive for weakness.   Physical Exam Updated Vital Signs BP (!) 123/51   Pulse 90   Temp (!) 100.6 F (38.1 C) (Oral)   Resp 18   Ht 5' 8"  (1.727 m)   Wt 99.8 kg   SpO2 93%   BMI 33.45 kg/m   Physical Exam Vitals and nursing note reviewed.  Constitutional:      General: He is not in acute distress.    Appearance: He is not diaphoretic.  HENT:     Head: Normocephalic and atraumatic.     Mouth/Throat:     Mouth: Mucous membranes are dry.  Eyes:     Conjunctiva/sclera: Conjunctivae normal.     Pupils: Pupils are equal, round, and reactive to light.  Cardiovascular:     Rate and Rhythm: Regular rhythm. Tachycardia present.     Pulses: Normal pulses.  Pulmonary:     Effort: Pulmonary effort is normal. No respiratory distress.  Abdominal:     General: There is no distension.     Tenderness: There is generalized abdominal tenderness. There is no guarding.  Musculoskeletal:        General: No deformity or signs of injury.     Cervical back: Neck supple.     Right lower leg: No edema.     Left lower leg: No edema.  Skin:    Findings: No lesion or rash.  Neurological:     Mental Status: He is alert.     Comments: Pleasantly demented, appears at baseline confusion. Moving all four extremities spontaneously.     ED Results / Procedures / Treatments   Labs (all labs ordered are listed, but only abnormal results are displayed) Labs Reviewed  RESP PANEL BY RT-PCR (FLU A&B, COVID) ARPGX2 - Abnormal; Notable for the following components:      Result Value   SARS Coronavirus 2 by RT PCR POSITIVE (*)    All other components within normal limits  LACTIC ACID, PLASMA - Abnormal; Notable for the following  components:   Lactic Acid, Venous 3.6 (*)    All other components within normal limits  LACTIC ACID, PLASMA - Abnormal; Notable for the following components:   Lactic Acid, Venous 3.9 (*)    All other components within normal limits  COMPREHENSIVE METABOLIC PANEL - Abnormal; Notable for the following components:   Potassium 3.1 (*)    Glucose, Bld 143 (*)    Albumin 3.4 (*)  AST 146 (*)    ALT 70 (*)    Total Bilirubin 4.3 (*)    GFR, Estimated 55 (*)    All other components within normal limits  CBC WITH DIFFERENTIAL/PLATELET - Abnormal; Notable for the following components:   WBC 11.6 (*)    RBC 3.76 (*)    Hemoglobin 12.3 (*)    HCT 35.7 (*)    Neutro Abs 10.6 (*)    Lymphs Abs 0.3 (*)    All other components within normal limits  PROTIME-INR - Abnormal; Notable for the following components:   Prothrombin Time 33.7 (*)    INR 3.3 (*)    All other components within normal limits  APTT - Abnormal; Notable for the following components:   aPTT 44 (*)    All other components within normal limits  URINALYSIS, ROUTINE W REFLEX MICROSCOPIC - Abnormal; Notable for the following components:   Color, Urine AMBER (*)    All other components within normal limits  BRAIN NATRIURETIC PEPTIDE - Abnormal; Notable for the following components:   B Natriuretic Peptide 427.9 (*)    All other components within normal limits  BILIRUBIN, DIRECT - Abnormal; Notable for the following components:   Bilirubin, Direct 2.6 (*)    All other components within normal limits  LACTATE DEHYDROGENASE - Abnormal; Notable for the following components:   LDH 316 (*)    All other components within normal limits  D-DIMER, QUANTITATIVE - Abnormal; Notable for the following components:   D-Dimer, Quant 0.88 (*)    All other components within normal limits  C-REACTIVE PROTEIN - Abnormal; Notable for the following components:   CRP 3.5 (*)    All other components within normal limits  SEDIMENTATION RATE -  Abnormal; Notable for the following components:   Sed Rate 35 (*)    All other components within normal limits  FERRITIN - Abnormal; Notable for the following components:   Ferritin 1,026 (*)    All other components within normal limits  MAGNESIUM - Abnormal; Notable for the following components:   Magnesium 1.4 (*)    All other components within normal limits  TROPONIN I (HIGH SENSITIVITY) - Abnormal; Notable for the following components:   Troponin I (High Sensitivity) 146 (*)    All other components within normal limits  TROPONIN I (HIGH SENSITIVITY) - Abnormal; Notable for the following components:   Troponin I (High Sensitivity) 550 (*)    All other components within normal limits  CULTURE, BLOOD (ROUTINE X 2)  CULTURE, BLOOD (ROUTINE X 2)  URINE CULTURE  FIBRINOGEN  PROCALCITONIN  PROCALCITONIN  LACTIC ACID, PLASMA  LACTIC ACID, PLASMA    EKG EKG Interpretation  Date/Time:  Monday October 05 2021 22:18:26 EST Ventricular Rate:  90 PR Interval:  132 QRS Duration: 176 QT Interval:  428 QTC Calculation: 524 R Axis:   -67 Text Interpretation: Sinus rhythm RBBB and LAFB Left ventricular hypertrophy Confirmed by Regan Lemming ([id number]) on 10/05/2021 10:48:25 PM  Radiology DG Chest Port 1 View  Result Date: 10/05/2021 CLINICAL DATA:  Questionable sepsis. EXAM: PORTABLE CHEST 1 VIEW COMPARISON:  Chest radiograph dated 05/04/2021. FINDINGS: Diffuse chronic interstitial coarsening. There is cardiomegaly with central vascular congestion. No focal consolidation, pleural effusion or pneumothorax. Median sternotomy wires and CABG vascular clips. Atherosclerotic calcification of the aorta. No acute osseous pathology degenerative changes spine. Partially visualized right shoulder arthroplasty. IMPRESSION: Cardiomegaly with central vascular congestion. No focal consolidation. Electronically Signed   By: Laren Everts.D.  On: 10/05/2021 20:50   CT Angio Chest/Abd/Pel for Dissection  W and/or Wo Contrast  Result Date: 10/05/2021 CLINICAL DATA:  Nausea and vomiting, evaluate for possible mesenteric ischemia. EXAM: CT ANGIOGRAPHY CHEST, ABDOMEN AND PELVIS TECHNIQUE: Non-contrast CT of the chest was initially obtained. Multidetector CT imaging through the chest, abdomen and pelvis was performed using the standard protocol during bolus administration of intravenous contrast. Multiplanar reconstructed images and MIPs were obtained and reviewed to evaluate the vascular anatomy. CONTRAST:  117m OMNIPAQUE IOHEXOL 350 MG/ML SOLN COMPARISON:  None. FINDINGS: CTA CHEST FINDINGS Cardiovascular: Initial precontrast images demonstrate atherosclerotic calcification as well as findings of prior coronary bypass grafting. No aneurysmal dilatation is seen. No hyperdense crescent to suggest aortic injury is noted. Post-contrast images show no evidence of dissection. The pulmonary artery shows a normal branching pattern. No evidence of pulmonary emboli are seen. Coronary calcifications and changes of prior coronary bypass are noted. No cardiac enlargement is seen. Aortic valve replacement is noted. No pericardial effusion is noted. Mediastinum/Nodes: Thoracic inlet is within normal limits. No sizable hilar or mediastinal adenopathy is noted. The esophagus as visualized is within normal limits. Lungs/Pleura: Lungs are well aerated bilaterally. No focal infiltrate or sizable effusion is seen. No sizable parenchymal nodules are noted. Mild scarring is noted in the lower lobes bilaterally. Musculoskeletal: Degenerative changes of the thoracic spine are noted. No rib fractures are seen. No compression deformities are noted. Review of the MIP images confirms the above findings. CTA ABDOMEN AND PELVIS FINDINGS VASCULAR Aorta: Abdominal aorta demonstrates diffuse atherosclerotic calcifications. No aneurysmal dilatation or dissection is noted. Celiac: Atherosclerotic calcifications of the origin of the celiac axis are  seen although no focal stenosis is noted. SMA: Atherosclerotic calcification is noted without focal stenosis. Renals: Dual renal arteries are noted on the right with single renal artery on the left. No stenotic changes are seen. IMA: Patent without evidence of aneurysm, dissection, vasculitis or significant stenosis. Inflow: Iliac arteries demonstrate atherosclerotic calcification without aneurysmal dilatation or dissection. Veins: No specific venous abnormality is noted. Review of the MIP images confirms the above findings. NON-VASCULAR Hepatobiliary: No focal liver abnormality is seen. No gallstones, gallbladder wall thickening, or biliary dilatation. Pancreas: Unremarkable. No pancreatic ductal dilatation or surrounding inflammatory changes. Spleen: Normal in size without focal abnormality. Adrenals/Urinary Tract: Adrenal glands are within normal limits bilaterally. Normal enhancement is noted in the kidneys bilaterally. No renal calculi or obstructive changes are seen. A tiny cyst is noted within the left kidney measuring less than 1 cm. No obstructive changes are seen. The ureters are unremarkable. The bladder is well distended. Stomach/Bowel: Scattered diverticular change of the colon is noted without evidence of diverticulitis. No obstructive changes of the colon are noted. The appendix is not well visualized. No inflammatory changes to suggest appendicitis are noted. The small bowel and stomach are unremarkable. Lymphatic: No sizable lymphadenopathy is noted. Reproductive: Prostate is diminutive likely related to the prior ablation Other: No abdominal wall hernia or abnormality. No abdominopelvic ascites. Musculoskeletal: Degenerative changes of lumbar spine are noted. No acute bony abnormality is seen. Degenerative changes of the sacroiliac joints are noted as well. Review of the MIP images confirms the above findings. IMPRESSION: No evidence of mesenteric ischemia. Diverticulosis without diverticulitis. No  acute abnormality noted. Aortic Atherosclerosis (ICD10-I70.0). Electronically Signed   By: MInez CatalinaM.D.   On: 10/05/2021 22:23    Procedures .Critical Care Performed by: LRegan Lemming MD Authorized by: LRegan Lemming MD   Critical care provider statement:  Critical care time (minutes):  120   Critical care was necessary to treat or prevent imminent or life-threatening deterioration of the following conditions:  Sepsis   Critical care was time spent personally by me on the following activities:  Discussions with consultants, discussions with primary provider, ordering and performing treatments and interventions, ordering and review of laboratory studies, ordering and review of radiographic studies, pulse oximetry, re-evaluation of patient's condition, review of old charts, obtaining history from patient or surrogate, evaluation of patient's response to treatment and development of treatment plan with patient or surrogate   Care discussed with: admitting provider     Medications Ordered in ED Medications  lactated ringers infusion ( Intravenous New Bag/Given 10/05/21 1929)  cefTRIAXone (ROCEPHIN) 2 g in sodium chloride 0.9 % 100 mL IVPB (0 g Intravenous Stopped 10/05/21 1947)  azithromycin (ZITHROMAX) 500 mg in sodium chloride 0.9 % 250 mL IVPB (0 mg Intravenous Stopped 10/05/21 2105)  aspirin tablet 325 mg (325 mg Oral Given 10/05/21 2218)  magnesium sulfate IVPB 2 g 50 mL (has no administration in time range)  lactated ringers bolus 1,000 mL (0 mLs Intravenous Stopped 10/05/21 2052)  acetaminophen (TYLENOL) tablet 1,000 mg (1,000 mg Oral Given 10/05/21 2218)  iohexol (OMNIPAQUE) 350 MG/ML injection 100 mL (100 mLs Intravenous Contrast Given 10/05/21 2204)  lactated ringers bolus 1,000 mL (0 mLs Intravenous Stopped 10/06/21 0131)  magnesium oxide (MAG-OX) tablet 400 mg (400 mg Oral Given 10/06/21 0139)  potassium chloride SA (KLOR-CON) CR tablet 20 mEq (20 mEq Oral Given 10/06/21  0139)    ED Course  I have reviewed the triage vital signs and the nursing notes.  Pertinent labs & imaging results that were available during my care of the patient were reviewed by me and considered in my medical decision making (see chart for details).  Clinical Course as of 10/06/21 1338  Mon Oct 05, 2021  2124 SARS Coronavirus 2 by RT PCR(!): POSITIVE [JL]  2124 Lactic Acid, Venous(!!): 3.6 [JL]  2124 Troponin I (High Sensitivity)(!!): 146 [JL]  2312 Troponin I (High Sensitivity)(!!): 550 [JL]  Tue Oct 06, 2021  1337 Enterobacterales(!): DETECTED [JL]  1338 Escherichia coli(!): DETECTED [JL]    Clinical Course User Index [JL] Regan Lemming, MD   MDM Rules/Calculators/A&P                           Peter Beltran presents with sepsis as per above.  His exam is most notable for sinus tachycardia, dry mucous membranes, generalized abdominal tenderness to palpation.  My immediate concern is for a life-threatening infection in this patient. Evaluation and treatment for sepsis began on patient arrival. Patient resuscitated with 2L LR for IVF resuscitation.   The most likely infectious source is abdominal, respiratory or genitourinary.  Skin exam revealed no clear source of infection. Genitourinary exam revealed no evidence of Fournierre's Gangrene. The patient is alert, interactive, with no meningismus. Low suspicion for meningitis.  Generalized abdominal tenderness to palpation on exam raises concern for intra-abdominal infectious process such as diverticulitis, pyelonephritis, bowel obstruction, bowel ischemia, ascending cholangitis, cholecystitis, mesenteric ischemia, other intra-abdominal emergency.  We will evaluate further with CTA chest abdomen pelvis.  Septic work-up initiated with screening labs.  Labs: Initial lactic acid elevated to 3.6, trending upwards to 3.9.  BNP nonspecifically elevated to 428, urinalysis without evidence of UTI, COVID-19 PCR testing resulted  positive, INR mildly supratherapeutic at 3.3 as the patient is on warfarin, leukocytosis to  11.6, mild anemia to 12.3, hypokalemia to 3.1, no evidence of AKI, mildly elevated LFTs with an elevated T bili to 4.3 and direct bili to 2.6.  Blood cultures and urine cultures collected prior to antibiotic administration.  The patient was covered with azithromycin and Rocephin.  Inflammatory markers were sent and significant for an elevated ferritin to 1026, CRP 3.5, ESR 35, D-dimer 0.88, LDH 316, procalcitonin of 20.  Imaging: CTA of the chest abdomen pelvis revealed no evidence of mesenteric ischemia, diverticulosis without diverticulitis, no acute abnormalities noted.  No inflammatory changes to suggest appendicitis, small bowel and stomach unremarkable, no obstructive changes noted, normal enhancement of the kidneys, bladder is well distended with no significant enhancement, no evidence of focal infiltrate or effusion of the lungs, no evidence of aortic abnormality, no evidence of PE or pericardial effusion.  Coronary calcifications and prior changes of coronary bypass were noted.  Patient denies chest pain.  He is not specifically tachypneic or short of breath.  He initially arrived tachycardic and febrile.  His fever improved following Tylenol administration and his tachycardia improved following IV fluid resuscitation.  He remained hemodynamically stable.  Spoke with the patient's daughter who stated the patient did test positive for COVID 2 weeks ago and was treated with a 5-day course of Paxil bid.  He has never cleared the virus on testing.  Suspect possible COVID rebound versus inflammatory syndrome versus sepsis from another organism. Regarding goals of care, the patient remains DNR.    For treatment, the patient was given 2L for IVF and Rocephin and azithromycin for antimicrobials.  Upon reassessment, the patient appears clinically improved. The patient is not currently hypotensive and has a MAP of  70. The patient's volume status appears to be improved after fluid resuscitation.  His tachycardia improved.  He remained hemodynamically stable, saturating well on room air.  The patient's initial troponin was elevated to 146, repeat troponin elevated to 550.  Cardiology consulted due to concern for NSTEMI.  Based off the presentation and lab findings, the patient meets criteria for severe sepsis.    [SEVERE SEPSIS] HYPOTENSION (Nursing BPA for hypotension = Is this sepsis?) CREATININE > 2.0, or urine output < 0.5 mL/kg/hour for 2 hours  BILIRUBIN > 2 mg/dL (34.2 mmol/L)  PLATELET COUNT < 100,000  INR > 1.5 or aPTT > 60 sec  LACTATE > 2 mmol/L    While in the ED, the patient was given: Medications  lactated ringers infusion ( Intravenous New Bag/Given 10/05/21 1929)  cefTRIAXone (ROCEPHIN) 2 g in sodium chloride 0.9 % 100 mL IVPB (0 g Intravenous Stopped 10/05/21 1947)  azithromycin (ZITHROMAX) 500 mg in sodium chloride 0.9 % 250 mL IVPB (0 mg Intravenous Stopped 10/05/21 2105)  aspirin tablet 325 mg (325 mg Oral Given 10/05/21 2218)  magnesium sulfate IVPB 2 g 50 mL (has no administration in time range)  lactated ringers bolus 1,000 mL (0 mLs Intravenous Stopped 10/05/21 2052)  acetaminophen (TYLENOL) tablet 1,000 mg (1,000 mg Oral Given 10/05/21 2218)  iohexol (OMNIPAQUE) 350 MG/ML injection 100 mL (100 mLs Intravenous Contrast Given 10/05/21 2204)  lactated ringers bolus 1,000 mL (0 mLs Intravenous Stopped 10/06/21 0131)  magnesium oxide (MAG-OX) tablet 400 mg (400 mg Oral Given 10/06/21 0139)  potassium chloride SA (KLOR-CON) CR tablet 20 mEq (20 mEq Oral Given 10/06/21 0139)   The patient is currently supratherapeutic on his warfarin with an INR greater than 3. With a climbing troponin, pt administered Aspirin 397m.  His urinalysis was without  evidence of urinary tract infection.  His CTA chest abdomen pelvis did not reveal an infectious etiology of his presentation.  Suspect  inflammatory syndrome post COVID, repeat COVID infection with concern for myocarditis versus ACS/NSTEMI in the setting of systemic infection.  Final cardiology recommendations pending at time of admission.  Patient will likely benefit from echocardiogram inpatient.   Peter Beltran was admitted to the hospital for ongoing treatment and monitoring.   Final Clinical Impression(s) / ED Diagnoses Final diagnoses:  COVID-19  NSTEMI (non-ST elevated myocardial infarction) ([place])  Sepsis with acute organ dysfunction, due to unspecified organism, unspecified type, unspecified whether septic shock present [facility])    Rx / DC Orders ED Discharge Orders     None        Regan Lemming, MD 10/06/21 [phone]

## 2021-10-05 NOTE — Sepsis Progress Note (Signed)
Notified bedside nurse of need to draw repeat lactic acid. 

## 2021-10-05 NOTE — ED Triage Notes (Signed)
Pt arrives via EMS from [facility] for fever and flulike symptoms x a couple of days. N/v/d , weakness, and fever. Hx of dementia. DNR at bedside.

## 2021-10-05 NOTE — Sepsis Progress Note (Signed)
Monitoring for the code sepsis protocol. °

## 2021-10-06 ENCOUNTER — Ambulatory Visit: Payer: Medicare Other | Admitting: Cardiology

## 2021-10-06 ENCOUNTER — Inpatient Hospital Stay (HOSPITAL_COMMUNITY): Payer: Medicare Other

## 2021-10-06 ENCOUNTER — Encounter (HOSPITAL_COMMUNITY): Payer: Self-pay | Admitting: Internal Medicine

## 2021-10-06 DIAGNOSIS — E1122 Type 2 diabetes mellitus with diabetic chronic kidney disease: Secondary | ICD-10-CM | POA: Diagnosis present

## 2021-10-06 DIAGNOSIS — Z954 Presence of other heart-valve replacement: Secondary | ICD-10-CM

## 2021-10-06 DIAGNOSIS — R509 Fever, unspecified: Secondary | ICD-10-CM

## 2021-10-06 DIAGNOSIS — R7401 Elevation of levels of liver transaminase levels: Secondary | ICD-10-CM | POA: Diagnosis not present

## 2021-10-06 DIAGNOSIS — I129 Hypertensive chronic kidney disease with stage 1 through stage 4 chronic kidney disease, or unspecified chronic kidney disease: Secondary | ICD-10-CM | POA: Diagnosis present

## 2021-10-06 DIAGNOSIS — R7989 Other specified abnormal findings of blood chemistry: Secondary | ICD-10-CM

## 2021-10-06 DIAGNOSIS — I252 Old myocardial infarction: Secondary | ICD-10-CM | POA: Diagnosis not present

## 2021-10-06 DIAGNOSIS — N1831 Chronic kidney disease, stage 3a: Secondary | ICD-10-CM | POA: Diagnosis present

## 2021-10-06 DIAGNOSIS — R778 Other specified abnormalities of plasma proteins: Secondary | ICD-10-CM

## 2021-10-06 DIAGNOSIS — R748 Abnormal levels of other serum enzymes: Secondary | ICD-10-CM | POA: Diagnosis not present

## 2021-10-06 DIAGNOSIS — Z9861 Coronary angioplasty status: Secondary | ICD-10-CM | POA: Diagnosis not present

## 2021-10-06 DIAGNOSIS — Z66 Do not resuscitate: Secondary | ICD-10-CM | POA: Diagnosis present

## 2021-10-06 DIAGNOSIS — G4733 Obstructive sleep apnea (adult) (pediatric): Secondary | ICD-10-CM | POA: Diagnosis present

## 2021-10-06 DIAGNOSIS — F03A4 Unspecified dementia, mild, with anxiety: Secondary | ICD-10-CM | POA: Diagnosis present

## 2021-10-06 DIAGNOSIS — A419 Sepsis, unspecified organism: Secondary | ICD-10-CM

## 2021-10-06 DIAGNOSIS — Z87891 Personal history of nicotine dependence: Secondary | ICD-10-CM | POA: Diagnosis not present

## 2021-10-06 DIAGNOSIS — A4151 Sepsis due to Escherichia coli [E. coli]: Secondary | ICD-10-CM | POA: Diagnosis present

## 2021-10-06 DIAGNOSIS — G2581 Restless legs syndrome: Secondary | ICD-10-CM | POA: Diagnosis present

## 2021-10-06 DIAGNOSIS — I052 Rheumatic mitral stenosis with insufficiency: Secondary | ICD-10-CM | POA: Diagnosis present

## 2021-10-06 DIAGNOSIS — R6520 Severe sepsis without septic shock: Secondary | ICD-10-CM

## 2021-10-06 DIAGNOSIS — R7402 Elevation of levels of lactic acid dehydrogenase (LDH): Secondary | ICD-10-CM | POA: Diagnosis present

## 2021-10-06 DIAGNOSIS — Z888 Allergy status to other drugs, medicaments and biological substances status: Secondary | ICD-10-CM | POA: Diagnosis not present

## 2021-10-06 DIAGNOSIS — G9341 Metabolic encephalopathy: Secondary | ICD-10-CM | POA: Diagnosis present

## 2021-10-06 DIAGNOSIS — E860 Dehydration: Secondary | ICD-10-CM | POA: Diagnosis present

## 2021-10-06 DIAGNOSIS — Z952 Presence of prosthetic heart valve: Secondary | ICD-10-CM | POA: Diagnosis not present

## 2021-10-06 DIAGNOSIS — E1142 Type 2 diabetes mellitus with diabetic polyneuropathy: Secondary | ICD-10-CM | POA: Diagnosis present

## 2021-10-06 DIAGNOSIS — Z881 Allergy status to other antibiotic agents status: Secondary | ICD-10-CM | POA: Diagnosis not present

## 2021-10-06 DIAGNOSIS — R1110 Vomiting, unspecified: Secondary | ICD-10-CM

## 2021-10-06 DIAGNOSIS — E039 Hypothyroidism, unspecified: Secondary | ICD-10-CM | POA: Diagnosis present

## 2021-10-06 DIAGNOSIS — I2510 Atherosclerotic heart disease of native coronary artery without angina pectoris: Secondary | ICD-10-CM | POA: Diagnosis present

## 2021-10-06 DIAGNOSIS — Z8616 Personal history of COVID-19: Secondary | ICD-10-CM | POA: Diagnosis not present

## 2021-10-06 DIAGNOSIS — D631 Anemia in chronic kidney disease: Secondary | ICD-10-CM | POA: Diagnosis present

## 2021-10-06 HISTORY — DX: Sepsis, unspecified organism: R65.20

## 2021-10-06 HISTORY — DX: Other specified abnormalities of plasma proteins: R77.8

## 2021-10-06 HISTORY — DX: Other specified abnormal findings of blood chemistry: R79.89

## 2021-10-06 HISTORY — DX: Vomiting, unspecified: R11.10

## 2021-10-06 LAB — BLOOD CULTURE ID PANEL (REFLEXED) - BCID2

## 2021-10-06 LAB — COMPREHENSIVE METABOLIC PANEL
ALT: 65 U/L — ABNORMAL HIGH (ref 0–44)
AST: 124 U/L — ABNORMAL HIGH (ref 15–41)
Albumin: 2.8 g/dL — ABNORMAL LOW (ref 3.5–5.0)
Alkaline Phosphatase: 40 U/L (ref 38–126)
Anion gap: 11 (ref 5–15)
BUN: 25 mg/dL — ABNORMAL HIGH (ref 8–23)
CO2: 21 mmol/L — ABNORMAL LOW (ref 22–32)
Calcium: 8.8 mg/dL — ABNORMAL LOW (ref 8.9–10.3)
Chloride: 101 mmol/L (ref 98–111)
Creatinine, Ser: 1.49 mg/dL — ABNORMAL HIGH (ref 0.61–1.24)
GFR, Estimated: 43 mL/min — ABNORMAL LOW (ref >60)
Glucose, Bld: 168 mg/dL — ABNORMAL HIGH (ref 70–99)
Potassium: 4.2 mmol/L (ref 3.5–5.1)
Sodium: 133 mmol/L — ABNORMAL LOW (ref 135–145)
Total Bilirubin: 4.6 mg/dL — ABNORMAL HIGH (ref 0.3–1.2)
Total Protein: 6 g/dL — ABNORMAL LOW (ref 6.5–8.1)

## 2021-10-06 LAB — CBC
HCT: 30.4 % — ABNORMAL LOW (ref 39.0–52.0)
Hemoglobin: 10.2 g/dL — ABNORMAL LOW (ref 13.0–17.0)
MCH: 32.2 pg (ref 26.0–34.0)
MCHC: 33.6 g/dL (ref 30.0–36.0)
MCV: 95.9 fL (ref 80.0–100.0)
Platelets: 130 10*3/uL — ABNORMAL LOW (ref 150–400)
RBC: 3.17 MIL/uL — ABNORMAL LOW (ref 4.22–5.81)
RDW: 14.6 % (ref 11.5–15.5)
WBC: 15.2 10*3/uL — ABNORMAL HIGH (ref 4.0–10.5)
nRBC: 0 % (ref 0.0–0.2)

## 2021-10-06 LAB — ECHOCARDIOGRAM COMPLETE
AR max vel: 1.52 cm2
AV Area VTI: 1.41 cm2
AV Area mean vel: 1.42 cm2
AV Mean grad: 15 mmHg
AV Peak grad: 27.7 mmHg
Ao pk vel: 2.63 m/s
Area-P 1/2: 3.45 cm2
Height: 68 in
MV M vel: 4.81 m/s
MV Peak grad: 92.5 mmHg
MV VTI: 1.62 cm2
Radius: 0.8 cm
S' Lateral: 3.9 cm
Weight: 3520 oz

## 2021-10-06 LAB — LACTIC ACID, PLASMA
Lactic Acid, Venous: 4.4 mmol/L (ref 0.5–1.9)
Lactic Acid, Venous: 2.2 mmol/L (ref 0.5–1.9)

## 2021-10-06 LAB — HEPATITIS PANEL, ACUTE
HCV Ab: NONREACTIVE
Hep A IgM: NONREACTIVE
Hep B C IgM: NONREACTIVE
Hepatitis B Surface Ag: NONREACTIVE

## 2021-10-06 LAB — AMMONIA: Ammonia: 18 umol/L (ref 9–35)

## 2021-10-06 LAB — TSH: TSH: 0.247 u[IU]/mL — ABNORMAL LOW (ref 0.350–4.500)

## 2021-10-06 LAB — PROTIME-INR
INR: 3.4 — ABNORMAL HIGH (ref 0.8–1.2)
Prothrombin Time: 34.5 seconds — ABNORMAL HIGH (ref 11.4–15.2)

## 2021-10-06 LAB — URINE CULTURE: Culture: NO GROWTH

## 2021-10-06 LAB — TROPONIN I (HIGH SENSITIVITY)
Troponin I (High Sensitivity): 1321 ng/L (ref <18)
Troponin I (High Sensitivity): 1245 ng/L (ref <18)

## 2021-10-06 LAB — T4, FREE: Free T4: 1.38 ng/dL — ABNORMAL HIGH (ref 0.61–1.12)

## 2021-10-06 LAB — VITAMIN B12: Vitamin B-12: 222 pg/mL (ref 180–914)

## 2021-10-06 LAB — HEMOGLOBIN A1C
Hgb A1c MFr Bld: 6 % — ABNORMAL HIGH (ref 4.8–5.6)
Mean Plasma Glucose: 125.5 mg/dL

## 2021-10-06 LAB — MAGNESIUM: Magnesium: 1.9 mg/dL (ref 1.7–2.4)

## 2021-10-06 LAB — PROCALCITONIN: Procalcitonin: 33.42 ng/mL

## 2021-10-06 MED ORDER — LINEZOLID 600 MG/300ML IV SOLN
600.0000 mg | Freq: Two times a day (BID) | INTRAVENOUS | Status: DC
  Administered 2021-10-06: 600 mg via INTRAVENOUS
  Filled 2021-10-06: qty 300

## 2021-10-06 MED ORDER — WARFARIN SODIUM 3 MG PO TABS
3.0000 mg | ORAL_TABLET | Freq: Once | ORAL | Status: AC
  Administered 2021-10-06: 3 mg via ORAL
  Filled 2021-10-06: qty 1

## 2021-10-06 MED ORDER — PERFLUTREN LIPID MICROSPHERE
1.0000 mL | INTRAVENOUS | Status: AC | PRN
  Administered 2021-10-06: 2 mL via INTRAVENOUS
  Filled 2021-10-06: qty 10

## 2021-10-06 MED ORDER — METRONIDAZOLE 500 MG/100ML IV SOLN
500.0000 mg | Freq: Three times a day (TID) | INTRAVENOUS | Status: DC
  Administered 2021-10-06: 500 mg via INTRAVENOUS
  Filled 2021-10-06: qty 100

## 2021-10-06 MED ORDER — ACETAMINOPHEN 325 MG PO TABS
650.0000 mg | ORAL_TABLET | Freq: Four times a day (QID) | ORAL | Status: DC | PRN
  Administered 2021-10-06: 650 mg via ORAL
  Filled 2021-10-06: qty 2

## 2021-10-06 MED ORDER — ACETAMINOPHEN 650 MG RE SUPP: 650.0000 mg | Freq: Four times a day (QID) | RECTAL | Status: DC | PRN

## 2021-10-06 MED ORDER — WARFARIN - PHARMACIST DOSING INPATIENT: Freq: Every day | Status: DC

## 2021-10-06 MED ORDER — LACTATED RINGERS IV SOLN: INTRAVENOUS | Status: AC

## 2021-10-06 MED ORDER — POTASSIUM CHLORIDE CRYS ER 20 MEQ PO TBCR
40.0000 meq | EXTENDED_RELEASE_TABLET | Freq: Once | ORAL | Status: AC
  Administered 2021-10-06: 40 meq via ORAL
  Filled 2021-10-06: qty 2

## 2021-10-06 MED ORDER — SODIUM CHLORIDE 0.9 % IV SOLN
2.0000 g | Freq: Two times a day (BID) | INTRAVENOUS | Status: DC
  Administered 2021-10-06: 2 g via INTRAVENOUS
  Filled 2021-10-06: qty 2

## 2021-10-06 MED ORDER — SODIUM CHLORIDE 0.9 % IV SOLN
2.0000 g | INTRAVENOUS | Status: DC
  Administered 2021-10-06 – 2021-10-07 (×2): 2 g via INTRAVENOUS
  Filled 2021-10-06 (×2): qty 20

## 2021-10-06 NOTE — Plan of Care (Signed)
°  Problem: Education: °Goal: Knowledge of General Education information will improve °Description: Including pain rating scale, medication(s)/side effects and non-pharmacologic comfort measures °Outcome: Progressing °  °Problem: Health Behavior/Discharge Planning: °Goal: Ability to manage health-related needs will improve °Outcome: Progressing °  °Problem: Clinical Measurements: °Goal: Ability to maintain clinical measurements within normal limits will improve °Outcome: Progressing °  °Problem: Activity: °Goal: Risk for activity intolerance will decrease °Outcome: Progressing °  °Problem: Nutrition: °Goal: Adequate nutrition will be maintained °Outcome: Progressing °  °Problem: Safety: °Goal: Ability to remain free from injury will improve °Outcome: Progressing °  °

## 2021-10-06 NOTE — H&P (Signed)
History and Physical    Peter Beltran CHE:[id number] DOB: [birth year] DOA: 10/05/2021  PCP: Javier Glazier, MD Patient coming from: Nursing home  Chief Complaint: Fever  HPI: Peter Beltran is a 87 y.o. male with medical history significant of mild cognitive impairment, diet-controlled type 2 diabetes, CAD status post CABG x3 in 2008, hypertension, hyperlipidemia, CKD stage II-IIIa, aortic stenosis status post mechanical AVR on Coumadin, bradycardia, RLS, BPH, GERD, gout, hypothyroidism, asthma, OSA presenting to the ED from his nursing home for evaluation of fever, chills, malaise, poor oral intake, vomiting, and diarrhea x1 day.  Daughter told ED provider that patient tested positive for COVID 2 weeks ago and was treated with a 5-day course of Paxlovid.  In the ED, patient febrile with temperature 100.6 F, slightly tachycardic.  Not hypoxic.  Labs showing WBC 11.6, hemoglobin 12.3 (stable), platelet count 162k.  Sodium 135, potassium 3.1, chloride 99, bicarb 23, BUN 21, creatinine 1.2, glucose 143.  AST 146, ALT 70, T bili 4.3.  Alk phos normal.  SARS-CoV-2 PCR test positive.  Influenza panel negative.  Lactic acid 3.6 >3.9 >4.4.  INR 3.3.  UA without signs of infection.  Blood and urine cultures pending.  High-sensitivity troponin 146 >550.  EKG without acute ischemic changes.  BNP 427.  LDH 316.  Age-adjusted D-dimer normal.  CRP 3.5.  ESR 35.  Ferritin 1026.  Procalcitonin 20.18.  Magnesium 1.4.  Chest x-ray showing cardiomegaly with central vascular congestion; no focal consolidation.  CT angiogram chest/abdomen/pelvis negative for acute finding. Cardiology consulted by ED provider.  Patient was given Tylenol, aspirin 325 mg, p.o. magnesium 400 mg, p.o. potassium 20 mEq, ceftriaxone, azithromycin, 2 L LR boluses, and IV magnesium 2 g.  Patient is not able to give any history due to his altered mental status.  Review of Systems:  All systems reviewed and apart from history of  presenting illness, are negative.  Past Medical History:  Diagnosis Date   Acute kidney failure, unspecified ([place]) 07/22/2019   Acute maxillary sinusitis, unspecified 08/16/2017   Allergic rhinitis, unspecified 02/04/2018   Anxiety disorder, unspecified 09/17/2013   Anxiety state 09/17/2013   Aortic stenosis    post aortic valve replacement with [employer] valve by Dr.VanTrigt on 07-27-11   Aortic valve disorder 05/29/2013   Atherosclerotic heart disease of native coronary artery without angina pectoris 08/16/2017   Benign prostatic hyperplasia with lower urinary tract symptoms 08/16/2017   Bradycardia with less than 60 beats per minute 04/29/2021   Candidal stomatitis 08/16/2017   Cellulitis, unspecified 07/25/2019   Chronic airway obstruction ([place]) 04/21/2013   Chronic coronary artery disease 04/10/2016   Formatting of this note might be different from the original. Overview:  status post CABG x3 in 2008 Formatting of this note might be different from the original. Overview:  Overview:  status post CABG x3 in 2008   Chronic rhinitis 12/27/2019   Last Assessment & Plan:  Concern over his nose. Chronic recurring nasal congestion.  Had a severe episode recently that responded to over-the-counter medication.  Currently asymptomatic.  Wants to make sure all is okay. EXAM intranasally shows no polyps, purulence, masses or obstructing anatomy.  Mucosa is overall healthy. PLAN: Reassured all looks okay.  I think what he is doing is fine.  Happy t   Cognitive communication deficit 10/28/2020   Constipation 08/16/2017   Constipation, unspecified 08/16/2017   Coronary artery disease    status post CABG x3 in 2008   Diabetic polyneuropathy associated with type  2 diabetes mellitus ([place]) 10/04/2016   Disorder of the skin and subcutaneous tissue, unspecified 07/25/2019   Disorders of muscle in diseases classified elsewhere, multiple sites 10/28/2020   Disturbance of salivary secretion 04/21/2013    Dry mouth, unspecified 10/18/2018   Dyslipidemia    Dysphagia 04/21/2013   Encounter for current long-term use of anticoagulants 05/29/2013   Formatting of this note might be different from the original. IMO routine update Formatting of this note might be different from the original. Overview:  IMO routine update   Encounter for immunization 07/25/2019   Enlarged prostate with lower urinary tract symptoms (LUTS) 05/29/2013   Esophageal reflux 02/26/2014   Essential hypertension 01/19/2019   Fall 04/30/2021   Follow-up visit for aortic valve replacement with metallic valve 16/08/9603   Foreign body aspiration 03/05/2016   Gastro-esophageal reflux disease without esophagitis 02/26/2014   Generalized edema 10/18/2018   Gout    Gouty arthropathy 05/29/2013   Hidrocystoma of left eyelid 12/10/2014   History of falling 10/28/2020   Hyperlipidemia, unspecified 11/11/2016   Hyperplasia of prostate    benign   Hypersomnia 06/14/2013   Hypertension    Hypertensive heart disease with heart failure ([place]) 10/18/2018   Hypertriglyceridemia 11/11/2016   Hypothyroidism    Hypothyroidism, unspecified 04/10/2016   Impacted cerumen, bilateral 08/16/2017   Impaired fasting glucose 04/21/2013   Iron deficiency anemia, unspecified 08/16/2017   Ischemic optic neuropathy of both eyes 01/28/2016   Localized edema 07/25/2019   Long term (current) use of anticoagulants 05/29/2013   Overview:  IMO routine update IMO routine update   Long term (current) use of aspirin 08/16/2017   Low back pain 08/16/2017   Low-tension glaucoma of both eyes, 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/2016   tmax 12/14 OCT 11/03/2016 VF 12/04/2015 Gonio 05/07/2015 tmax 12/14 OCT 11/03/2016 VF 12/04/2015 Gonio 05/07/2015   Low-tension glaucoma, bilateral, 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/2016   tmax 12/14 OCT 11/03/2016 VF 12/04/2015 Gonio 05/07/2015 tmax 12/14 OCT 11/03/2016 VF 12/04/2015 Gonio 05/07/2015   Lumbar stenosis 04/30/2015   Memory loss, short  term 03/30/2016   Mild cognitive impairment 08/30/2016   Mixed dyslipidemia 01/19/2019   Moderate persistent asthma without complication 54/07/8118   Moderate persistent asthma, uncomplicated 14/78/2956   Monitoring for long-term anticoagulant use 08/03/2017   Muscle weakness (generalized) 10/28/2020   Myocardial infarct ([place])    Nasal congestion 02/04/2018   Neuropathy 04/04/2014   Obstructive sleep apnea 12/17/2013   Obstructive sleep apnea of adult 12/17/2013   Old myocardial infarction 08/22/2019   Oral mucositis (ulcerative), unspecified 10/28/2020   Osteoarthrosis 04/21/2013   Other malaise 07/25/2019   Other specified disorders of nose and nasal sinuses 10/28/2020   Overweight 01/19/2019   Pain in thoracic spine 10/18/2018   Periodic limb movement disorder 04/30/2021   Periodic limb movements of sleep 08/26/2013   Presence of aortocoronary bypass graft 10/18/2018   Presence of prosthetic heart valve 10/18/2018   Pruritus, unspecified 11/10/2020   Pseudophakia of both eyes 12/10/2014   Psoriasis and similar disorder 01/28/2014   Rash and other nonspecific skin eruption 08/16/2017   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/28/2013   Restless legs syndrome 08/26/2013   Spells 08/26/2013   Spinal stenosis, lumbar region without neurogenic claudication 04/30/2015   Status post aortic valve replacement with metallic valve 21/30/8657   Status post shoulder joint replacement    right shoulder   Subtherapeutic anticoagulation 04/10/2016   Syncope and collapse 06/14/2013   Overview:  with seizure like activity. with seizure like activity.  Traumatic subarachnoid hemorrhage without loss of consciousness, subsequent encounter 10/28/2020   Type 2 diabetes mellitus with diabetic neuropathy, unspecified ([place]) 08/16/2017   Unspecified abnormalities of gait and mobility 10/18/2018   Unspecified jaundice 11/10/2020   Unspecified osteoarthritis, unspecified site 04/21/2013   Unspecified urinary  incontinence 05/17/2018   Urinary retention 07/18/2018   Vitamin D deficiency, unspecified 08/16/2017    Past Surgical History:  Procedure Laterality Date   AORTIC VALVE REPLACEMENT     CORONARY ARTERY BYPASS GRAFT     PROSTATE ABLATION     TONSILLECTOMY       reports that he has quit smoking. He has never used smokeless tobacco. He reports that he does not drink alcohol and does not use drugs.  Allergies  Allergen Reactions   Atorvastatin Calcium Other (See Comments)    Developed myopathy.     Dorzolamide Other (See Comments)    Follicula     Brimonidine Other (See Comments)    Red and burning eyes   Brinzolamide-Brimonidine     Redness in eyes - reaction to [place] [Atorvastatin Calcium]     Developed myopathy.   Rotigotine Other (See Comments)    Falling asleep uncontrollably - reaction to Neupro   Tamsulosin Other (See Comments)    Unknown reaction (Flomax)   Vancomycin     Red Rash   Zolpidem Other (See Comments)    Pt. states that he possibly had a neuro reaction.   Dutasteride Rash    Reaction to Avodart    Family History  Problem Relation Age of Onset   Hypertension Neg Hx    Diabetes Neg Hx    Cancer Neg Hx    Heart disease Neg Hx     Prior to Admission medications   Medication Sig Start Date End Date Taking? Authorizing Provider  acetaminophen (TYLENOL) 650 MG CR tablet Take 650 mg by mouth in the morning and at bedtime.    [provider]  albuterol (PROVENTIL HFA;VENTOLIN HFA) 108 (90 Base) MCG/ACT inhaler Inhale 2 puffs into the lungs every 6 (six) hours as needed for wheezing or shortness of breath.    [provider]  allopurinol (ZYLOPRIM) 300 MG tablet Take 300 mg by mouth daily.    [provider]  antiseptic oral rinse (BIOTENE) LIQD 30 mLs by Mouth Rinse route in the morning and at bedtime.    [provider]  clonazePAM (KLONOPIN) 0.5 MG tablet Take 0.5 mg by mouth at bedtime.    [provider]  Docusate Sodium (DSS) 100 MG CAPS Take 100 mg by mouth as needed for constipation.    [provider]  ergocalciferol (VITAMIN D2) 1.25 MG (50000 UT) capsule Take 50,000 Units by mouth every Tuesday.    [provider]  fenofibrate (TRICOR) 145 MG tablet Take 145 mg by mouth daily.    [provider]  Fluticasone-Salmeterol (ADVAIR) 100-50 MCG/DOSE AEPB Inhale 2 puffs into the lungs 2 (two) times daily.    [provider]  gabapentin (NEURONTIN) 100 MG capsule Take 100 mg by mouth 3 (three) times daily.    [provider]  icosapent Ethyl (VASCEPA) 1 g capsule Take 2 capsules (2 g total) by mouth 2 (two) times daily. 04/23/21   Revankar, Reita Cliche, MD  latanoprost (XALATAN) 0.005 % ophthalmic solution Place 1 drop into both eyes at bedtime.    [provider]  levothyroxine (SYNTHROID) 112 MCG tablet Take 112 mcg by mouth as directed.  6 days a week    [provider]  levothyroxine (SYNTHROID) 200 MCG tablet Take 200 mcg by mouth every 7 (seven) days.    [provider]  magic mouthwash (nystatin, hydrocortisone, diphenhydrAMINE) suspension Swish and spit 5 mLs 4 (four) times daily as needed for mouth pain.    [provider]  nitroGLYCERIN (NITROSTAT) 0.4 MG SL tablet Place 0.4 mg under the tongue every 5 (five) minutes as needed for chest pain.    [provider]  omeprazole (PRILOSEC) 20 MG capsule Take 20 mg by mouth 2 (two) times daily before a meal.    [provider]  ondansetron (ZOFRAN) 4 MG tablet Take 4 mg by mouth as needed for nausea or vomiting.    [provider]  rOPINIRole (REQUIP) 0.5 MG tablet Take 0.5 mg by mouth 2 (two) times daily. 05/19/21   [provider]  rOPINIRole (REQUIP) 1 MG tablet Take 1 mg by mouth daily. 08/03/21   [provider]  SLOW FE 142 (45 Fe) MG TBCR Take 45 mg by mouth daily. 04/01/21   [provider]  sodium  chloride (OCEAN) 0.65 % SOLN nasal spray Place 1 spray into both nostrils as needed for congestion.    [provider]  spironolactone (ALDACTONE) 25 MG tablet Take 25 mg by mouth daily.    [provider]  timolol (TIMOPTIC) 0.5 % ophthalmic solution Place 1 drop into both eyes 2 (two) times daily. 12/24/19   [provider]  warfarin (COUMADIN) 3 MG tablet Take 3 mg by mouth 2 (two) times a week.    [provider]  warfarin (COUMADIN) 4 MG tablet Take 4 mg by mouth as directed. 5 times weekly    [provider]  warfarin (COUMADIN) 5 MG tablet Take 5 mg by mouth daily. 08/05/21   [provider]    Physical Exam: Vitals:   10/05/21 2230 10/06/21 0100 10/06/21 0211 10/06/21 0230  BP: (!) 123/51 (!) 100/49 104/63 (!) 131/102  Pulse: 90 82 70 65  Resp: 18 (!) 23 20 16   Temp:      TempSrc:      SpO2: 93% 94% 97% 96%  Weight:      Height:        Physical Exam Constitutional:      General: He is not in acute distress. HENT:     Head: Normocephalic and atraumatic.     Mouth/Throat:     Comments: Very dry mucous membranes Cardiovascular:     Rate and Rhythm: Normal rate and regular rhythm.     Pulses: Normal pulses.  Pulmonary:     Effort: Pulmonary effort is normal. No respiratory distress.     Breath sounds: Normal breath sounds. No wheezing or rales.  Abdominal:     General: Bowel sounds are normal. There is no distension.     Palpations: Abdomen is soft.     Tenderness: There is no abdominal tenderness.  Musculoskeletal:        General: No swelling or tenderness.     Cervical back: Normal range of motion and neck supple.  Skin:    General: Skin is warm and dry.  Neurological:     Comments: Somnolent but arousable Nonverbal Not following commands Moving all extremities spontaneously     Labs on Admission: I have personally reviewed following labs and imaging studies  CBC: Recent Labs  Lab 10/05/21 1903  WBC 11.6*   NEUTROABS 10.6*  HGB 12.3*  HCT 35.7*  MCV 94.9  PLT 742   Basic Metabolic Panel: Recent Labs  Lab 10/05/21 1903 10/05/21 2225  NA 135  --   K 3.1*  --   CL 99  --   CO2 23  --   GLUCOSE 143*  --   BUN 21  --   CREATININE 1.21  --   CALCIUM 9.6  --   MG  --  1.4*   GFR: Estimated Creatinine Clearance: 42.8 mL/min (by C-G formula based on SCr of 1.21 mg/dL). Liver Function Tests: Recent Labs  Lab 10/05/21 1903  AST 146*  ALT 70*  ALKPHOS 47  BILITOT 4.3*  PROT 6.8  ALBUMIN 3.4*   No results for input(s): LIPASE, AMYLASE in the last 168 hours. No results for input(s): AMMONIA in the last 168 hours. Coagulation Profile: Recent Labs  Lab 10/05/21 1903  INR 3.3*   Cardiac Enzymes: No results for input(s): CKTOTAL, CKMB, CKMBINDEX, TROPONINI in the last 168 hours. BNP (last 3 results) No results for input(s): PROBNP in the last 8760 hours. HbA1C: No results for input(s): HGBA1C in the last 72 hours. CBG: No results for input(s): GLUCAP in the last 168 hours. Lipid Profile: No results for input(s): CHOL, HDL, LDLCALC, TRIG, CHOLHDL, LDLDIRECT in the last 72 hours. Thyroid Function Tests: No results for input(s): TSH, T4TOTAL, FREET4, T3FREE, THYROIDAB in the last 72 hours. Anemia Panel: Recent Labs    10/05/21 2225  FERRITIN 1,026*   Urine analysis:    Component Value Date/Time   COLORURINE AMBER (A) 10/05/2021 2259   APPEARANCEUR CLEAR 10/05/2021 2259   LABSPEC 1.013 10/05/2021 2259   PHURINE 7.0 10/05/2021 2259   GLUCOSEU NEGATIVE 10/05/2021 2259   HGBUR NEGATIVE 10/05/2021 2259   BILIRUBINUR NEGATIVE 10/05/2021 2259   Short 10/05/2021 2259   PROTEINUR NEGATIVE 10/05/2021 2259   UROBILINOGEN 1.0 05/27/2015 1600   NITRITE NEGATIVE 10/05/2021 2259   LEUKOCYTESUR NEGATIVE 10/05/2021 2259    Radiological Exams on Admission: DG Chest Port 1 View  Result Date: 10/05/2021 CLINICAL DATA:  Questionable sepsis. EXAM: PORTABLE CHEST 1  VIEW COMPARISON:  Chest radiograph dated 05/04/2021. FINDINGS: Diffuse chronic interstitial coarsening. There is cardiomegaly with central vascular congestion. No focal consolidation, pleural effusion or pneumothorax. Median sternotomy wires and CABG vascular clips. Atherosclerotic calcification of the aorta. No acute osseous pathology degenerative changes spine. Partially visualized right shoulder arthroplasty. IMPRESSION: Cardiomegaly with central vascular congestion. No focal consolidation. Electronically Signed   By: Anner Crete M.D.   On: 10/05/2021 20:50   CT Angio Chest/Abd/Pel for Dissection W and/or Wo Contrast  Result Date: 10/05/2021 CLINICAL DATA:  Nausea and vomiting, evaluate for possible mesenteric ischemia. EXAM: CT ANGIOGRAPHY CHEST, ABDOMEN AND PELVIS TECHNIQUE: Non-contrast CT of the chest was initially obtained. Multidetector CT imaging through the chest, abdomen and pelvis was performed using the standard protocol during bolus administration of intravenous contrast. Multiplanar reconstructed images and MIPs were obtained and reviewed to evaluate the vascular anatomy. CONTRAST:  181m OMNIPAQUE IOHEXOL 350 MG/ML SOLN COMPARISON:  None. FINDINGS: CTA CHEST FINDINGS Cardiovascular: Initial precontrast images demonstrate atherosclerotic calcification as well as findings of prior coronary bypass grafting. No aneurysmal dilatation is seen. No hyperdense crescent to suggest aortic injury is noted. Post-contrast images show no evidence of dissection. The pulmonary artery shows a normal branching pattern. No evidence of pulmonary emboli are seen. Coronary calcifications and changes of prior coronary bypass are noted. No cardiac enlargement is seen. Aortic valve replacement is noted. No pericardial  effusion is noted. Mediastinum/Nodes: Thoracic inlet is within normal limits. No sizable hilar or mediastinal adenopathy is noted. The esophagus as visualized is within normal limits. Lungs/Pleura:  Lungs are well aerated bilaterally. No focal infiltrate or sizable effusion is seen. No sizable parenchymal nodules are noted. Mild scarring is noted in the lower lobes bilaterally. Musculoskeletal: Degenerative changes of the thoracic spine are noted. No rib fractures are seen. No compression deformities are noted. Review of the MIP images confirms the above findings. CTA ABDOMEN AND PELVIS FINDINGS VASCULAR Aorta: Abdominal aorta demonstrates diffuse atherosclerotic calcifications. No aneurysmal dilatation or dissection is noted. Celiac: Atherosclerotic calcifications of the origin of the celiac axis are seen although no focal stenosis is noted. SMA: Atherosclerotic calcification is noted without focal stenosis. Renals: Dual renal arteries are noted on the right with single renal artery on the left. No stenotic changes are seen. IMA: Patent without evidence of aneurysm, dissection, vasculitis or significant stenosis. Inflow: Iliac arteries demonstrate atherosclerotic calcification without aneurysmal dilatation or dissection. Veins: No specific venous abnormality is noted. Review of the MIP images confirms the above findings. NON-VASCULAR Hepatobiliary: No focal liver abnormality is seen. No gallstones, gallbladder wall thickening, or biliary dilatation. Pancreas: Unremarkable. No pancreatic ductal dilatation or surrounding inflammatory changes. Spleen: Normal in size without focal abnormality. Adrenals/Urinary Tract: Adrenal glands are within normal limits bilaterally. Normal enhancement is noted in the kidneys bilaterally. No renal calculi or obstructive changes are seen. A tiny cyst is noted within the left kidney measuring less than 1 cm. No obstructive changes are seen. The ureters are unremarkable. The bladder is well distended. Stomach/Bowel: Scattered diverticular change of the colon is noted without evidence of diverticulitis. No obstructive changes of the colon are noted. The appendix is not well  visualized. No inflammatory changes to suggest appendicitis are noted. The small bowel and stomach are unremarkable. Lymphatic: No sizable lymphadenopathy is noted. Reproductive: Prostate is diminutive likely related to the prior ablation Other: No abdominal wall hernia or abnormality. No abdominopelvic ascites. Musculoskeletal: Degenerative changes of lumbar spine are noted. No acute bony abnormality is seen. Degenerative changes of the sacroiliac joints are noted as well. Review of the MIP images confirms the above findings. IMPRESSION: No evidence of mesenteric ischemia. Diverticulosis without diverticulitis. No acute abnormality noted. Aortic Atherosclerosis (ICD10-I70.0). Electronically Signed   By: Inez Catalina M.D.   On: 10/05/2021 22:23    EKG: Independently reviewed.  Sinus rhythm, LVH, LAFB, RBBB, QTC 524.  No significant change since prior tracing except QT interval has increased.  Assessment/Plan Principal Problem:   Severe sepsis [facility]) Active Problems:   Coronary artery disease   Lab test positive for detection of COVID-19 virus   Elevated troponin   Vomiting and diarrhea   Severe sepsis COVID-19 positive Meets criteria for severe sepsis with fever, tachycardia, leukocytosis, and worsening lactic acidosis.  He is testing positive for COVID but family told ED provider that patient tested positive for COVID 2 weeks ago and was treated with a 5-day course of Paxlovid.  He is not hypoxic.  CT not suggestive of pneumonia.  UA without signs of infection.  No nuchal rigidity to suggest meningitis, no headaches reported.  Having vomiting and diarrhea but CT abdomen pelvis negative for acute infectious process.  Concern for possible viral myocarditis given elevated troponin, elevated BNP, elevated ESR/CRP, and recent COVID infection. -Given significantly elevated procalcitonin, cover with broad-spectrum antibiotics at this time including cefepime, Flagyl, and linezolid (allergic to vancomycin).   Urine and blood cultures  pending.  Patient received 2 L of fluid boluses in the ED, continue gentle IV fluid hydration as he appears dehydrated, however, monitor volume status closely given elevated BNP and concern for possible LV dysfunction.  Echocardiogram ordered.  Trend lactate and WBC count.  Elevated troponin CAD status post CABG x3 in 2008 High-sensitivity troponin 146 >550.  EKG without acute ischemic changes.  Patient is not able to give any history.  CT angiogram negative for PE.  Cardiology feels that elevated troponin is due to demand ischemia but recommending checking echocardiogram to exclude acute LV dysfunction/myocarditis.  -Cardiac monitoring.  Patient was given full dose aspirin.  Trend troponin. Echocardiogram ordered.  Elevated BNP Echo done in February 2022 showing EF 55 to 60% and moderate mitral regurgitation.  Patient is not hypoxic and CT without pulmonary edema. -Repeat echocardiogram ordered  Vomiting and diarrhea In setting of recent COVID infection.  CT negative for acute intra-abdominal finding. -Antiemetic as needed.  C. difficile PCR and GI pathogen panel, enteric precautions.  Acute metabolic encephalopathy Per chart, patient has mild cognitive impairment.  At present, he is somnolent but arousable.  Nonverbal and not following any commands.  However, moving all extremities spontaneously.  No nuchal rigidity. -Stat head CT.  Check TSH, B12, and ammonia levels.  Hypokalemia Hypomagnesemia QT prolongation -Cardiac monitoring.  Replace potassium and magnesium, continue to monitor.  Avoid QT prolonging drugs if possible.  Repeat EKG in AM.  Elevated liver enzymes Likely due to viral illness/severe sepsis.  Transaminases and bilirubin elevated but no hepatobiliary abnormality seen on CT. Abdominal ultrasound done in August 2022 ([facility]) showing gallstones. -Hepatitis panel, repeat ultrasound  Diet controlled type 2 diabetes -Check  A1c  Hypertension -Hold antihypertensives at this time  CKD stage II-IIIa Creatinine 1.2, previously ranging between 0.8-1.2. -Continue to monitor renal function  History of mechanical AVR INR currently within therapeutic range. -Stat head CT ordered given altered mental status, if negative for acute intracranial bleed, continue Coumadin.  Asthma: Stable. BPH GERD Gout Hypothyroidism -Pharmacy med rec pending.  DVT prophylaxis: Coumadin Code Status: DNR   Family Communication: No family available at this time. Disposition Plan: Status is: Inpatient  Remains inpatient appropriate because: Severe sepsis  Level of care: Level of care: Telemetry Medical  The medical decision making on this patient was of high complexity and the patient is at high risk for clinical deterioration, therefore this is a level 3 visit.  Shela Leff MD [facility]  If 7PM-7AM, please contact night-coverage [email or web address]  10/06/2021, 3:51 AM

## 2021-10-06 NOTE — Progress Notes (Signed)
HOSPITAL MEDICINE OVERNIGHT EVENT NOTE    Nursing reports that patients last troponin was 1321 and last lactic acid was 4.4 without establishment of a peak.  Chart reviewed, elevated troponin etiology not entirely clear.  Patient currently being treated for sepsis thought to be secondary to gastroenteritis and E. coli bacteremia.  Repeat troponin and lactic acid ordered.  Repeat lactic acid found to be 2.2 which is down trended substantially from 4.4 earlier this morning.  We will maintain current treatment plan as I anticipate lactic acid will continue to downtrend.  Troponin has additionally been repeated and has been found to be 1245 confirming that 1321 was indeed the peak.  Patient is currently hemodynamically stable without chest pain.  No further troponin is necessary.  Vernelle Emerald  MD [facility]

## 2021-10-06 NOTE — Progress Notes (Signed)
NEW ADMISSION NOTE New Admission Note:   Arrival Method: ED stretcher Mental Orientation: AAOx3 Telemetry: 5M01 Assessment: Completed Skin: Intact IV: LFA Pain: 0/10 Tubes: n/a Safety Measures: Safety Fall Prevention Plan has been given, discussed and signed Admission: Completed [facility] Orientation: Patient has been orientated to the room, unit and staff.  Family: None at bedside  Orders have been reviewed and implemented. Will continue to monitor the patient. Call light has been placed within reach and bed alarm has been activated.   Vira Agar, RN

## 2021-10-06 NOTE — Consult Note (Signed)
Cardiology Consultation:   Patient ID: BRAIDEN Beltran MRN: [id number]; DOB: 1924-12-13  Admit date: 10/05/2021 Date of Consult: 10/06/2021  PCP:  Javier Glazier, MD   [place] Prov[facility] Cardiologist:  None        Patient Profile:   Peter Beltran is a 87 y.o. male with extensive past medical history including mechanical AVR 2012, dementai, CABG 2008, diabetes who is being seen 10/06/2021 for the evaluation of abnormal troponin at the request of Dr. Armandina Gemma.  History of Present Illness:   Peter Beltran is a 87 year old male who presents to the emergency department with sepsis in the setting of recent COVID19 illness after having ongoing nausea, vomiting, and dehydration.  He underwent fluid resuscitation with interval improvement in his clinical status on my evaluation.  When I evaluated the patient he denies chest pain, shortness of breath, orthopnea, or PND.  He has been adherent to his warfarin and INR is therapeutic.  Given troponin elevation we were asked to evaluate out of concern for myocarditis or acute coronary syndrome.  In discussion with the family they would not want to pursue invasive procedures unless absolutely necessary.  A CTA was obtained in the ED with concerns for mesenteric ischemia but does not exclude pulmonary embolism.   Past Medical History:  Diagnosis Date   Acute kidney failure, unspecified ([place]) 07/22/2019   Acute maxillary sinusitis, unspecified 08/16/2017   Allergic rhinitis, unspecified 02/04/2018   Anxiety disorder, unspecified 09/17/2013   Anxiety state 09/17/2013   Aortic stenosis    post aortic valve replacement with [employer].Jude's valve by Dr.VanTrigt on 07-27-11   Aortic valve disorder 05/29/2013   Atherosclerotic heart disease of native coronary artery without angina pectoris 08/16/2017   Benign prostatic hyperplasia with lower urinary tract symptoms 08/16/2017   Bradycardia with less than 60 beats per minute 04/29/2021    Candidal stomatitis 08/16/2017   Cellulitis, unspecified 07/25/2019   Chronic airway obstruction ([place]) 04/21/2013   Chronic coronary artery disease 04/10/2016   Formatting of this note might be different from the original. Overview:  status post CABG x3 in 2008 Formatting of this note might be different from the original. Overview:  Overview:  status post CABG x3 in 2008   Chronic rhinitis 12/27/2019   Last Assessment & Plan:  Concern over his nose. Chronic recurring nasal congestion.  Had a severe episode recently that responded to over-the-counter medication.  Currently asymptomatic.  Wants to make sure all is okay. EXAM intranasally shows no polyps, purulence, masses or obstructing anatomy.  Mucosa is overall healthy. PLAN: Reassured all looks okay.  I think what he is doing is fine.  Happy t   Cognitive communication deficit 10/28/2020   Constipation 08/16/2017   Constipation, unspecified 08/16/2017   Coronary artery disease    status post CABG x3 in 2008   Diabetic polyneuropathy associated with type 2 diabetes mellitus ([place]) 10/04/2016   Disorder of the skin and subcutaneous tissue, unspecified 07/25/2019   Disorders of muscle in diseases classified elsewhere, multiple sites 10/28/2020   Disturbance of salivary secretion 04/21/2013   Dry mouth, unspecified 10/18/2018   Dyslipidemia    Dysphagia 04/21/2013   Encounter for current long-term use of anticoagulants 05/29/2013   Formatting of this note might be different from the original. IMO routine update Formatting of this note might be different from the original. Overview:  IMO routine update   Encounter for immunization 07/25/2019   Enlarged prostate with lower urinary tract symptoms (LUTS) 05/29/2013  Esophageal reflux 02/26/2014   Essential hypertension 01/19/2019   Fall 04/30/2021   Follow-up visit for aortic valve replacement with metallic valve 31/51/7616   Foreign body aspiration 03/05/2016   Gastro-esophageal reflux disease  without esophagitis 02/26/2014   Generalized edema 10/18/2018   Gout    Gouty arthropathy 05/29/2013   Hidrocystoma of left eyelid 12/10/2014   History of falling 10/28/2020   Hyperlipidemia, unspecified 11/11/2016   Hyperplasia of prostate    benign   Hypersomnia 06/14/2013   Hypertension    Hypertensive heart disease with heart failure ([place]) 10/18/2018   Hypertriglyceridemia 11/11/2016   Hypothyroidism    Hypothyroidism, unspecified 04/10/2016   Impacted cerumen, bilateral 08/16/2017   Impaired fasting glucose 04/21/2013   Iron deficiency anemia, unspecified 08/16/2017   Ischemic optic neuropathy of both eyes 01/28/2016   Localized edema 07/25/2019   Long term (current) use of anticoagulants 05/29/2013   Overview:  IMO routine update IMO routine update   Long term (current) use of aspirin 08/16/2017   Low back pain 08/16/2017   Low-tension glaucoma of both eyes, 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/2016   tmax 12/14 OCT 11/03/2016 VF 12/04/2015 Gonio 05/07/2015 tmax 12/14 OCT 11/03/2016 VF 12/04/2015 Gonio 05/07/2015   Low-tension glaucoma, bilateral, 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/2016   tmax 12/14 OCT 11/03/2016 VF 12/04/2015 Gonio 05/07/2015 tmax 12/14 OCT 11/03/2016 VF 12/04/2015 Gonio 05/07/2015   Lumbar stenosis 04/30/2015   Memory loss, short term 03/30/2016   Mild cognitive impairment 08/30/2016   Mixed dyslipidemia 01/19/2019   Moderate persistent asthma without complication 07/37/1062   Moderate persistent asthma, uncomplicated 69/48/5462   Monitoring for long-term anticoagulant use 08/03/2017   Muscle weakness (generalized) 10/28/2020   Myocardial infarct ([place])    Nasal congestion 02/04/2018   Neuropathy 04/04/2014   Obstructive sleep apnea 12/17/2013   Obstructive sleep apnea of adult 12/17/2013   Old myocardial infarction 08/22/2019   Oral mucositis (ulcerative), unspecified 10/28/2020   Osteoarthrosis 04/21/2013   Other malaise 07/25/2019   Other specified disorders of nose and  nasal sinuses 10/28/2020   Overweight 01/19/2019   Pain in thoracic spine 10/18/2018   Periodic limb movement disorder 04/30/2021   Periodic limb movements of sleep 08/26/2013   Presence of aortocoronary bypass graft 10/18/2018   Presence of prosthetic heart valve 10/18/2018   Pruritus, unspecified 11/10/2020   Pseudophakia of both eyes 12/10/2014   Psoriasis and similar disorder 01/28/2014   Rash and other nonspecific skin eruption 08/16/2017   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/28/2013   Restless legs syndrome 08/26/2013   Spells 08/26/2013   Spinal stenosis, lumbar region without neurogenic claudication 04/30/2015   Status post aortic valve replacement with metallic valve 70/35/0093   Status post shoulder joint replacement    right shoulder   Subtherapeutic anticoagulation 04/10/2016   Syncope and collapse 06/14/2013   Overview:  with seizure like activity. with seizure like activity.   Traumatic subarachnoid hemorrhage without loss of consciousness, subsequent encounter 10/28/2020   Type 2 diabetes mellitus with diabetic neuropathy, unspecified ([place]) 08/16/2017   Unspecified abnormalities of gait and mobility 10/18/2018   Unspecified jaundice 11/10/2020   Unspecified osteoarthritis, unspecified site 04/21/2013   Unspecified urinary incontinence 05/17/2018   Urinary retention 07/18/2018   Vitamin D deficiency, unspecified 08/16/2017    Past Surgical History:  Procedure Laterality Date   AORTIC VALVE REPLACEMENT     CORONARY ARTERY BYPASS GRAFT     PROSTATE ABLATION     TONSILLECTOMY       Home Medications:  Prior to Admission medications  Medication Sig Start Date End Date Taking? Authorizing Provider  acetaminophen (TYLENOL) 650 MG CR tablet Take 650 mg by mouth in the morning and at bedtime.    [provider]  albuterol (PROVENTIL HFA;VENTOLIN HFA) 108 (90 Base) MCG/ACT inhaler Inhale 2 puffs into the lungs every 6 (six) hours as needed for wheezing or shortness  of breath.    [provider]  allopurinol (ZYLOPRIM) 300 MG tablet Take 300 mg by mouth daily.    [provider]  antiseptic oral rinse (BIOTENE) LIQD 30 mLs by Mouth Rinse route in the morning and at bedtime.    [provider]  clonazePAM (KLONOPIN) 0.5 MG tablet Take 0.5 mg by mouth at bedtime.    [provider]  Docusate Sodium (DSS) 100 MG CAPS Take 100 mg by mouth as needed for constipation.    [provider]  ergocalciferol (VITAMIN D2) 1.25 MG (50000 UT) capsule Take 50,000 Units by mouth every Tuesday.    [provider]  fenofibrate (TRICOR) 145 MG tablet Take 145 mg by mouth daily.    [provider]  Fluticasone-Salmeterol (ADVAIR) 100-50 MCG/DOSE AEPB Inhale 2 puffs into the lungs 2 (two) times daily.    [provider]  gabapentin (NEURONTIN) 100 MG capsule Take 100 mg by mouth 3 (three) times daily.    [provider]  icosapent Ethyl (VASCEPA) 1 g capsule Take 2 capsules (2 g total) by mouth 2 (two) times daily. 04/23/21   Revankar, Reita Cliche, MD  latanoprost (XALATAN) 0.005 % ophthalmic solution Place 1 drop into both eyes at bedtime.    [provider]  levothyroxine (SYNTHROID) 112 MCG tablet Take 112 mcg by mouth as directed. 6 days a week    [provider]  levothyroxine (SYNTHROID) 200 MCG tablet Take 200 mcg by mouth every 7 (seven) days.    [provider]  magic mouthwash (nystatin, hydrocortisone, diphenhydrAMINE) suspension Swish and spit 5 mLs 4 (four) times daily as needed for mouth pain.    [provider]  nitroGLYCERIN (NITROSTAT) 0.4 MG SL tablet Place 0.4 mg under the tongue every 5 (five) minutes as needed for chest pain.    [provider]  omeprazole (PRILOSEC) 20 MG capsule Take 20 mg by mouth 2 (two) times daily before a meal.    [provider]  ondansetron (ZOFRAN) 4 MG tablet Take 4 mg by mouth as needed for nausea or  vomiting.    [provider]  rOPINIRole (REQUIP) 0.5 MG tablet Take 0.5 mg by mouth 2 (two) times daily. 05/19/21   [provider]  rOPINIRole (REQUIP) 1 MG tablet Take 1 mg by mouth daily. 08/03/21   [provider]  SLOW FE 142 (45 Fe) MG TBCR Take 45 mg by mouth daily. 04/01/21   [provider]  sodium chloride (OCEAN) 0.65 % SOLN nasal spray Place 1 spray into both nostrils as needed for congestion.    [provider]  spironolactone (ALDACTONE) 25 MG tablet Take 25 mg by mouth daily.    [provider]  timolol (TIMOPTIC) 0.5 % ophthalmic solution Place 1 drop into both eyes 2 (two) times daily. 12/24/19   [provider]  warfarin (COUMADIN) 3 MG tablet Take 3 mg by mouth 2 (two) times a week.    [provider]  warfarin (COUMADIN) 4 MG tablet Take 4 mg by mouth as directed. 5 times weekly    [provider]  warfarin (COUMADIN)  5 MG tablet Take 5 mg by mouth daily. 08/05/21   [provider]    Inpatient Medications: Scheduled Meds:  aspirin  325 mg Oral Daily   Continuous Infusions:  azithromycin Stopped (10/05/21 2105)   cefTRIAXone (ROCEPHIN)  IV Stopped (10/05/21 1947)   lactated ringers 150 mL/hr at 10/06/21 0206   PRN Meds:   Allergies:    Allergies  Allergen Reactions   Atorvastatin Calcium Other (See Comments)    Developed myopathy.     Dorzolamide Other (See Comments)    Follicula     Brimonidine Other (See Comments)    Red and burning eyes   Brinzolamide-Brimonidine     Redness in eyes - reaction to Simla [Atorvastatin Calcium]     Developed myopathy.   Rotigotine Other (See Comments)    Falling asleep uncontrollably - reaction to Neupro   Tamsulosin Other (See Comments)    Unknown reaction (Flomax)   Vancomycin     Red Rash   Zolpidem Other (See Comments)    Pt. states that he possibly had a neuro reaction.   Dutasteride Rash    Reaction to Avodart     Social History:   Social History   Socioeconomic History   Marital status: Married    Spouse name: Not on file   Number of children: Not on file   Years of education: Not on file   Highest education level: Not on file  Occupational History   Not on file  Tobacco Use   Smoking status: Former   Smokeless tobacco: Never  Substance and Sexual Activity   Alcohol use: No   Drug use: No   Sexual activity: Not on file  Other Topics Concern   Not on file  Social History Narrative   Not on file   Social Determinants of Health   Financial Resource Strain: Not on file  Food Insecurity: Not on file  Transportation Needs: Not on file  Physical Activity: Not on file  Stress: Not on file  Social Connections: Not on file  Intimate Partner Violence: Not on file    Family History:    Family History  Problem Relation Age of Onset   Hypertension Neg Hx    Diabetes Neg Hx    Cancer Neg Hx    Heart disease Neg Hx      ROS:  Please see the history of present illness.   All other ROS reviewed and negative.     Physical Exam/Data:   Vitals:   10/05/21 2145 10/05/21 2230 10/06/21 0100 10/06/21 0211  BP: (!) 107/44 (!) 123/51 (!) 100/49 104/63  Pulse: 90 90 82 70  Resp:  18 (!) 23 20  Temp:      TempSrc:      SpO2: 92% 93% 94% 97%  Weight:      Height:        Intake/Output Summary (Last 24 hours) at 10/06/2021 0314 Last data filed at 10/06/2021 0203 Gross per 24 hour  Intake 1288.46 ml  Output --  Net 1288.46 ml   Last 3 Weights 10/05/2021 06/08/2021 05/04/2021  Weight (lbs) 220 lb 206 lb 1.3 oz 220 lb  Weight (kg) 99.791 kg 93.477 kg 99.791 kg     Body mass index is 33.45 kg/m.  General:  Well nourished, well developed, in no acute distress HEENT: normal Neck: no JVD Vascular: No carotid bruits; Distal pulses 2+ bilaterally Cardiac:  normal S1, S2; RRR; no murmur  with crisp aortic  valve Lungs:  clear to auscultation bilaterally, no wheezing, rhonchi or rales   Abd: soft, nontender, no hepatomegaly  Ext: no edema Musculoskeletal:  No deformities, BUE and BLE strength normal and equal Skin: warm and dry  Neuro:  CNs 2-12 intact, no focal abnormalities noted Psych:  Normal affect   EKG:  The EKG was personally reviewed and demonstrates:   NSR with bifascicular block, stable from prior  Telemetry:  Telemetry was personally reviewed and demonstrates:  Sinus tachycardia resolving to normal sinus rhythm.  Relevant CV Studies: 2022 normal LVEF, normal RV function, well functioning mechanical aortic prosthesis.  Laboratory Data:  High Sensitivity Troponin:   Recent Labs  Lab 10/05/21 1937 10/05/21 2225  TROPONINIHS 146* 550*     Chemistry Recent Labs  Lab 10/05/21 1903 10/05/21 2225  NA 135  --   K 3.1*  --   CL 99  --   CO2 23  --   GLUCOSE 143*  --   BUN 21  --   CREATININE 1.21  --   CALCIUM 9.6  --   MG  --  1.4*  GFRNONAA 55*  --   ANIONGAP 13  --     Recent Labs  Lab 10/05/21 1903  PROT 6.8  ALBUMIN 3.4*  AST 146*  ALT 70*  ALKPHOS 47  BILITOT 4.3*   Lipids No results for input(s): CHOL, TRIG, HDL, LABVLDL, LDLCALC, CHOLHDL in the last 168 hours.  Hematology Recent Labs  Lab 10/05/21 1903  WBC 11.6*  RBC 3.76*  HGB 12.3*  HCT 35.7*  MCV 94.9  MCH 32.7  MCHC 34.5  RDW 14.3  PLT 162   Thyroid No results for input(s): TSH, FREET4 in the last 168 hours.  BNP Recent Labs  Lab 10/05/21 2224  BNP 427.9*    DDimer  Recent Labs  Lab 10/05/21 2225  DDIMER 0.88*     Radiology/Studies:  DG Chest Port 1 View  Result Date: 10/05/2021 CLINICAL DATA:  Questionable sepsis. EXAM: PORTABLE CHEST 1 VIEW COMPARISON:  Chest radiograph dated 05/04/2021. FINDINGS: Diffuse chronic interstitial coarsening. There is cardiomegaly with central vascular congestion. No focal consolidation, pleural effusion or pneumothorax. Median sternotomy wires and CABG vascular clips. Atherosclerotic calcification of the aorta. No  acute osseous pathology degenerative changes spine. Partially visualized right shoulder arthroplasty. IMPRESSION: Cardiomegaly with central vascular congestion. No focal consolidation. Electronically Signed   By: Anner Crete M.D.   On: 10/05/2021 20:50   CT Angio Chest/Abd/Pel for Dissection W and/or Wo Contrast  Result Date: 10/05/2021 CLINICAL DATA:  Nausea and vomiting, evaluate for possible mesenteric ischemia. EXAM: CT ANGIOGRAPHY CHEST, ABDOMEN AND PELVIS TECHNIQUE: Non-contrast CT of the chest was initially obtained. Multidetector CT imaging through the chest, abdomen and pelvis was performed using the standard protocol during bolus administration of intravenous contrast. Multiplanar reconstructed images and MIPs were obtained and reviewed to evaluate the vascular anatomy. CONTRAST:  179mL OMNIPAQUE IOHEXOL 350 MG/ML SOLN COMPARISON:  None. FINDINGS: CTA CHEST FINDINGS Cardiovascular: Initial precontrast images demonstrate atherosclerotic calcification as well as findings of prior coronary bypass grafting. No aneurysmal dilatation is seen. No hyperdense crescent to suggest aortic injury is noted. Post-contrast images show no evidence of dissection. The pulmonary artery shows a normal branching pattern. No evidence of pulmonary emboli are seen. Coronary calcifications and changes of prior coronary bypass are noted. No cardiac enlargement is seen. Aortic valve replacement is noted. No pericardial effusion is noted. Mediastinum/Nodes: Thoracic inlet is within normal limits. No sizable hilar  or mediastinal adenopathy is noted. The esophagus as visualized is within normal limits. Lungs/Pleura: Lungs are well aerated bilaterally. No focal infiltrate or sizable effusion is seen. No sizable parenchymal nodules are noted. Mild scarring is noted in the lower lobes bilaterally. Musculoskeletal: Degenerative changes of the thoracic spine are noted. No rib fractures are seen. No compression deformities are  noted. Review of the MIP images confirms the above findings. CTA ABDOMEN AND PELVIS FINDINGS VASCULAR Aorta: Abdominal aorta demonstrates diffuse atherosclerotic calcifications. No aneurysmal dilatation or dissection is noted. Celiac: Atherosclerotic calcifications of the origin of the celiac axis are seen although no focal stenosis is noted. SMA: Atherosclerotic calcification is noted without focal stenosis. Renals: Dual renal arteries are noted on the right with single renal artery on the left. No stenotic changes are seen. IMA: Patent without evidence of aneurysm, dissection, vasculitis or significant stenosis. Inflow: Iliac arteries demonstrate atherosclerotic calcification without aneurysmal dilatation or dissection. Veins: No specific venous abnormality is noted. Review of the MIP images confirms the above findings. NON-VASCULAR Hepatobiliary: No focal liver abnormality is seen. No gallstones, gallbladder wall thickening, or biliary dilatation. Pancreas: Unremarkable. No pancreatic ductal dilatation or surrounding inflammatory changes. Spleen: Normal in size without focal abnormality. Adrenals/Urinary Tract: Adrenal glands are within normal limits bilaterally. Normal enhancement is noted in the kidneys bilaterally. No renal calculi or obstructive changes are seen. A tiny cyst is noted within the left kidney measuring less than 1 cm. No obstructive changes are seen. The ureters are unremarkable. The bladder is well distended. Stomach/Bowel: Scattered diverticular change of the colon is noted without evidence of diverticulitis. No obstructive changes of the colon are noted. The appendix is not well visualized. No inflammatory changes to suggest appendicitis are noted. The small bowel and stomach are unremarkable. Lymphatic: No sizable lymphadenopathy is noted. Reproductive: Prostate is diminutive likely related to the prior ablation Other: No abdominal wall hernia or abnormality. No abdominopelvic ascites.  Musculoskeletal: Degenerative changes of lumbar spine are noted. No acute bony abnormality is seen. Degenerative changes of the sacroiliac joints are noted as well. Review of the MIP images confirms the above findings. IMPRESSION: No evidence of mesenteric ischemia. Diverticulosis without diverticulitis. No acute abnormality noted. Aortic Atherosclerosis (ICD10-I70.0). Electronically Signed   By: Inez Catalina M.D.   On: 10/05/2021 22:23     Assessment and Plan:   87 year old male with complex past medical history as per HPI including dementia, CAD s/p CABG remotely, SJM mechanical AVR on warfarin, recent COVID19 infection presenting with severe sepsis on whom we are consulted for elevated troponin.  Troponin is mildly elevated without chest pain or ischemic EKG changes.  In the context of this and known CAD this is favored to represent demand ischemia.  Overall presentation is more consistent with sepsis but reasonable to check echocardiogram to exclude acute LV dysfunction in the context of myocarditis.  Would have low threshold to exclude pulmonary embolism with recent COVID bout if he does not respond appropriately to resuscitation.  #Abnormal troponin - Trend to peak - Check echocardiogram  #history of mechanical aortic valve replacement - Continue warfarin goal INR 2-3 - Fine to bridge with heparin if needed for procedures - Hold further aspirin.  #History of CAD - History of statin intolerance; okay to hold given liver dysfunciton - Warfarin as above.  Risk Assessment/Risk Scores:                For questions or updates, please contact [place] Please consult [email or web address] for  contact info under    Signed, Delight Hoh, MD  10/06/2021 3:14 AM

## 2021-10-06 NOTE — Evaluation (Signed)
Clinical/Bedside Swallow Evaluation Patient Details  Name: Peter Beltran MRN: [id number] Date of Birth: [birth year]  Today's Date: 10/06/2021 Time: SLP Start Time (ACUTE ONLY): 0900 SLP Stop Time (ACUTE ONLY): 0908 SLP Time Calculation (min) (ACUTE ONLY): 8 min  Past Medical History:  Past Medical History:  Diagnosis Date   Acute kidney failure, unspecified ([place]) 07/22/2019   Acute maxillary sinusitis, unspecified 08/16/2017   Allergic rhinitis, unspecified 02/04/2018   Anxiety disorder, unspecified 09/17/2013   Anxiety state 09/17/2013   Aortic stenosis    post aortic valve replacement with St.Jude's valve by Dr.VanTrigt on 07-27-11   Aortic valve disorder 05/29/2013   Atherosclerotic heart disease of native coronary artery without angina pectoris 08/16/2017   Benign prostatic hyperplasia with lower urinary tract symptoms 08/16/2017   Bradycardia with less than 60 beats per minute 04/29/2021   Candidal stomatitis 08/16/2017   Cellulitis, unspecified 07/25/2019   Chronic airway obstruction ([place]) 04/21/2013   Chronic coronary artery disease 04/10/2016   Formatting of this note might be different from the original. Overview:  status post CABG x3 in 2008 Formatting of this note might be different from the original. Overview:  Overview:  status post CABG x3 in 2008   Chronic rhinitis 12/27/2019   Last Assessment & Plan:  Concern over his nose. Chronic recurring nasal congestion.  Had a severe episode recently that responded to over-the-counter medication.  Currently asymptomatic.  Wants to make sure all is okay. EXAM intranasally shows no polyps, purulence, masses or obstructing anatomy.  Mucosa is overall healthy. PLAN: Reassured all looks okay.  I think what he is doing is fine.  Happy t   Cognitive communication deficit 10/28/2020   Constipation 08/16/2017   Constipation, unspecified 08/16/2017   Coronary artery disease    status post CABG x3 in 2008   Diabetic polyneuropathy  associated with type 2 diabetes mellitus ([place]) 10/04/2016   Disorder of the skin and subcutaneous tissue, unspecified 07/25/2019   Disorders of muscle in diseases classified elsewhere, multiple sites 10/28/2020   Disturbance of salivary secretion 04/21/2013   Dry mouth, unspecified 10/18/2018   Dyslipidemia    Dysphagia 04/21/2013   Encounter for current long-term use of anticoagulants 05/29/2013   Formatting of this note might be different from the original. IMO routine update Formatting of this note might be different from the original. Overview:  IMO routine update   Encounter for immunization 07/25/2019   Enlarged prostate with lower urinary tract symptoms (LUTS) 05/29/2013   Esophageal reflux 02/26/2014   Essential hypertension 01/19/2019   Fall 04/30/2021   Follow-up visit for aortic valve replacement with metallic valve 19/37/9024   Foreign body aspiration 03/05/2016   Gastro-esophageal reflux disease without esophagitis 02/26/2014   Generalized edema 10/18/2018   Gout    Gouty arthropathy 05/29/2013   Hidrocystoma of left eyelid 12/10/2014   History of falling 10/28/2020   Hyperlipidemia, unspecified 11/11/2016   Hyperplasia of prostate    benign   Hypersomnia 06/14/2013   Hypertension    Hypertensive heart disease with heart failure ([place]) 10/18/2018   Hypertriglyceridemia 11/11/2016   Hypothyroidism    Hypothyroidism, unspecified 04/10/2016   Impacted cerumen, bilateral 08/16/2017   Impaired fasting glucose 04/21/2013   Iron deficiency anemia, unspecified 08/16/2017   Ischemic optic neuropathy of both eyes 01/28/2016   Localized edema 07/25/2019   Long term (current) use of anticoagulants 05/29/2013   Overview:  IMO routine update IMO routine update   Long term (current) use of aspirin 08/16/2017   Low  back pain 08/16/2017   Low-tension glaucoma of both eyes, 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/2016   tmax 12/14 OCT 11/03/2016 VF 12/04/2015 Gonio 05/07/2015 tmax 12/14 OCT 11/03/2016  VF 12/04/2015 Gonio 05/07/2015   Low-tension glaucoma, bilateral, 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/2016   tmax 12/14 OCT 11/03/2016 VF 12/04/2015 Gonio 05/07/2015 tmax 12/14 OCT 11/03/2016 VF 12/04/2015 Gonio 05/07/2015   Lumbar stenosis 04/30/2015   Memory loss, short term 03/30/2016   Mild cognitive impairment 08/30/2016   Mixed dyslipidemia 01/19/2019   Moderate persistent asthma without complication 67/61/9509   Moderate persistent asthma, uncomplicated 32/67/1245   Monitoring for long-term anticoagulant use 08/03/2017   Muscle weakness (generalized) 10/28/2020   Myocardial infarct ([place])    Nasal congestion 02/04/2018   Neuropathy 04/04/2014   Obstructive sleep apnea 12/17/2013   Obstructive sleep apnea of adult 12/17/2013   Old myocardial infarction 08/22/2019   Oral mucositis (ulcerative), unspecified 10/28/2020   Osteoarthrosis 04/21/2013   Other malaise 07/25/2019   Other specified disorders of nose and nasal sinuses 10/28/2020   Overweight 01/19/2019   Pain in thoracic spine 10/18/2018   Periodic limb movement disorder 04/30/2021   Periodic limb movements of sleep 08/26/2013   Presence of aortocoronary bypass graft 10/18/2018   Presence of prosthetic heart valve 10/18/2018   Pruritus, unspecified 11/10/2020   Pseudophakia of both eyes 12/10/2014   Psoriasis and similar disorder 01/28/2014   Rash and other nonspecific skin eruption 08/16/2017   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/28/2013   Restless legs syndrome 08/26/2013   Spells 08/26/2013   Spinal stenosis, lumbar region without neurogenic claudication 04/30/2015   Status post aortic valve replacement with metallic valve 80/99/8338   Status post shoulder joint replacement    right shoulder   Subtherapeutic anticoagulation 04/10/2016   Syncope and collapse 06/14/2013   Overview:  with seizure like activity. with seizure like activity.   Traumatic subarachnoid hemorrhage without loss of consciousness, subsequent encounter 10/28/2020    Type 2 diabetes mellitus with diabetic neuropathy, unspecified ([place]) 08/16/2017   Unspecified abnormalities of gait and mobility 10/18/2018   Unspecified jaundice 11/10/2020   Unspecified osteoarthritis, unspecified site 04/21/2013   Unspecified urinary incontinence 05/17/2018   Urinary retention 07/18/2018   Vitamin D deficiency, unspecified 08/16/2017   Past Surgical History:  Past Surgical History:  Procedure Laterality Date   AORTIC VALVE REPLACEMENT     CORONARY ARTERY BYPASS GRAFT     PROSTATE ABLATION     TONSILLECTOMY     HPI:  Pt is a 87 year old admitted with severe sepsis with fever, tachycardia, leukocytosis, and worsening lactic acidosis.  He is testing positive for COVID but family told ED provider that patient tested positive for COVID 2 weeks ago and was treated with a 5-day course of Paxlovid.  He is not hypoxic.  CT not suggestive of pneumonia. Concern for possible viral myocarditis given elevated troponin, elevated BNP, elevated ESR/CRP, and recent COVID infection.    Assessment / Plan / Recommendation  Clinical Impression  Pt demonstrates normal swallow function. No deficits observed. Recommend pt resume unrestricted regular diet. Will sign off.      Aspiration Risk       Diet Recommendation Regular;Thin liquid   Liquid Administration via: Cup;Straw Medication Administration: Whole meds with liquid Supervision: Patient able to self feed    Other  Recommendations Oral Care Recommendations: Patient independent with oral care    Recommendations for follow up therapy are one component of a multi-disciplinary discharge planning process, led by the attending physician.  Recommendations may  be updated based on patient status, additional functional criteria and insurance authorization.  Follow up Recommendations No SLP follow up      Assistance Recommended at Discharge    Functional Status Assessment    Frequency and Duration            Prognosis         Swallow Study   General HPI: Pt is a 87 year old admitted with severe sepsis with fever, tachycardia, leukocytosis, and worsening lactic acidosis.  He is testing positive for COVID but family told ED provider that patient tested positive for COVID 2 weeks ago and was treated with a 5-day course of Paxlovid.  He is not hypoxic.  CT not suggestive of pneumonia. Concern for possible viral myocarditis given elevated troponin, elevated BNP, elevated ESR/CRP, and recent COVID infection. Type of Study: Bedside Swallow Evaluation Previous Swallow Assessment: none Diet Prior to this Study: Thin liquids Temperature Spikes Noted: No Respiratory Status: Room air History of Recent Intubation: No Behavior/Cognition: Alert;Cooperative;Pleasant mood Oral Cavity Assessment: Within Functional Limits Oral Care Completed by SLP: No Oral Cavity - Dentition: Adequate natural dentition Vision: Functional for self-feeding Self-Feeding Abilities: Able to feed self Patient Positioning: Upright in bed Baseline Vocal Quality: Normal Volitional Cough: Strong Volitional Swallow: Able to elicit    Oral/Motor/Sensory Function     Ice Chips     Thin Liquid Thin Liquid: Within functional limits    Nectar Thick Nectar Thick Liquid: Not tested   Honey Thick     Puree Puree: Not tested   Solid     Solid: Within functional limits      Jaicey Sweaney, Katherene Ponto 10/06/2021,9:38 AM

## 2021-10-06 NOTE — ED Notes (Signed)
Assumed care on patient , patient resting on bed with no distress, respirations unlabored , repositioned for comfort , warm blankets provided , waiting for in-patient bed assignment .

## 2021-10-06 NOTE — Progress Notes (Signed)
PHARMACY - PHYSICIAN COMMUNICATION CRITICAL VALUE ALERT - BLOOD CULTURE IDENTIFICATION (BCID)  Peter Beltran is an 87 y.o. male who presented to [facility] on 10/05/2021 with a chief complaint of fever  Assessment: 87 yo male admitted 11/21 for evaluation of fever, chills, malaise, poor oral intake, vomiting, and diarrhea x1 day. Of note, patient tested positive for COVID19 2 weeks prior to admission and was treated with a 5 day course of Paxlovid. Urine culture and blood cultures were done. BCID now with E. coli with no resistance noted. Blood cultures with 4/4 bottles gram-negative rods.   Name of physician (or Provider) Contacted: Dr. Niel Hummer  Current antibiotics: cefepime + linezolid + metronidazole   Changes to prescribed antibiotics recommended: Discontinue cefepime, linezolid, and metronidazole. Start ceftriaxone 2 gram daily. Recommendations accepted by provider  No results found for this or any previous visit.  Zenaida Deed, PharmD PGY1 Acute Care Pharmacy Resident  Phone: [phone] 10/06/2021  11:21 AM  Please check [email or web address] for unit-specific pharmacy phone numbers.

## 2021-10-06 NOTE — Progress Notes (Signed)
Progress Note  Patient Name: Peter Beltran Date of Encounter: 10/06/2021  [facility] Cardiologist: Skeet Latch, MD new  Subjective   Feeling fine.  No chest pain.  Breathing stable.   Inpatient Medications    Scheduled Meds:  aspirin  325 mg Oral Daily   Continuous Infusions:  ceFEPime (MAXIPIME) IV     lactated ringers 75 mL/hr at 10/06/21 0423   linezolid (ZYVOX) IV Stopped (10/06/21 0558)   metronidazole Stopped (10/06/21 9798)   PRN Meds: acetaminophen **OR** acetaminophen, perflutren lipid microspheres (DEFINITY) IV suspension   Vital Signs    Vitals:   10/06/21 0417 10/06/21 0500 10/06/21 0600 10/06/21 0640  BP:  (!) 106/51 (!) 112/52 128/65  Pulse:  66 67 75  Resp:  16 17 (!) 21  Temp: 98.3 F (36.8 C) (!) 97.2 F (36.2 C)    TempSrc: Oral Axillary    SpO2:  96% 98% 98%  Weight:      Height:        Intake/Output Summary (Last 24 hours) at 10/06/2021 0841 Last data filed at 10/06/2021 0203 Gross per 24 hour  Intake 1288.46 ml  Output --  Net 1288.46 ml   Last 3 Weights 10/05/2021 06/08/2021 05/04/2021  Weight (lbs) 220 lb 206 lb 1.3 oz 220 lb  Weight (kg) 99.791 kg 93.477 kg 99.791 kg      Telemetry    SR - Personally Reviewed  ECG    SR, HR 68, RBBB w/ QRS 166 ms, no change from 05/2021  - Personally Reviewed  Physical Exam   GEN: No acute distress.   Neck: No JVD Cardiac: RRR, II/VI systolic murmur at LUSB,  no rubs, or gallops.  Respiratory: Clear to auscultation bilaterally. GI: Soft, nontender, non-distended  MS: No edema; No deformity. Neuro:  Nonfocal  Psych: Normal affect   Labs    High Sensitivity Troponin:   Recent Labs  Lab 10/05/21 1937 10/05/21 2225 10/06/21 0426  TROPONINIHS 146* 550* 1,321*     Chemistry Recent Labs  Lab 10/05/21 1903 10/05/21 2225 10/06/21 0413  NA 135  --  133*  K 3.1*  --  4.2  CL 99  --  101  CO2 23  --  21*  GLUCOSE 143*  --  168*  BUN 21  --  25*  CREATININE 1.21   --  1.49*  CALCIUM 9.6  --  8.8*  MG  --  1.4* 1.9  PROT 6.8  --  6.0*  ALBUMIN 3.4*  --  2.8*  AST 146*  --  124*  ALT 70*  --  65*  ALKPHOS 47  --  40  BILITOT 4.3*  --  4.6*  GFRNONAA 55*  --  43*  ANIONGAP 13  --  11    Lipids No results for input(s): CHOL, TRIG, HDL, LABVLDL, LDLCALC, CHOLHDL in the last 168 hours.  Hematology Recent Labs  Lab 10/05/21 1903 10/06/21 0413  WBC 11.6* 15.2*  RBC 3.76* 3.17*  HGB 12.3* 10.2*  HCT 35.7* 30.4*  MCV 94.9 95.9  MCH 32.7 32.2  MCHC 34.5 33.6  RDW 14.3 14.6  PLT 162 130*   Thyroid  Recent Labs  Lab 10/06/21 0450  TSH 0.247*    BNP Recent Labs  Lab 10/05/21 2224  BNP 427.9*    DDimer  Recent Labs  Lab 10/05/21 2225  DDIMER 0.88*    Lab Results  Component Value Date   FERRITIN 1,026 (H) 10/05/2021  Radiology    CT HEAD WO CONTRAST (5MM)  Result Date: 10/06/2021 CLINICAL DATA:  87 year old male with history of delirium. EXAM: CT HEAD WITHOUT CONTRAST TECHNIQUE: Contiguous axial images were obtained from the base of the skull through the vertex without intravenous contrast. COMPARISON:  Head CT 05/04/2021. FINDINGS: Brain: Moderate cerebral atrophy. Patchy and confluent areas of decreased attenuation are noted throughout the deep and periventricular white matter of the cerebral hemispheres bilaterally, compatible with chronic microvascular ischemic disease. More well-defined area of low attenuation in the left thalamus, similar to the prior study, compatible with an old lacunar infarct. Additional small well-defined focus of low attenuation in the superior aspect of the left cerebellar hemisphere also compatible with an old lacunar infarct. No evidence of acute infarction, hemorrhage, hydrocephalus, extra-axial collection or mass lesion/mass effect. Vascular: Multiple vascular calcifications noted in the vertebrobasilar and carotid systems. Skull: Normal. Negative for fracture or focal lesion. Sinuses/Orbits:  Multifocal mucosal thickening in the paranasal sinuses bilaterally, with complete opacification of the right frontal sinus, frontoethmoidal recess, and many of the anterior right ethmoid cells. Widespread mucosal thickening is noted in the other ethmoid cells, maxillary sinuses and sphenoid sinuses bilaterally. Small air-fluid level noted in the left maxillary sinus. Other: None. IMPRESSION: 1. No acute intracranial abnormalities. 2. 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 3. Extensive chronic paranasal sinus disease. Additionally, there is a small air-fluid level in the left maxillary sinus. Clinical correlation for signs and symptoms of acute sinusitis is recommended. Electronically Signed   By: Vinnie Langton M.D.   On: 10/06/2021 06:58   DG Chest Port 1 View  Result Date: 10/05/2021 CLINICAL DATA:  Questionable sepsis. EXAM: PORTABLE CHEST 1 VIEW COMPARISON:  Chest radiograph dated 05/04/2021. FINDINGS: Diffuse chronic interstitial coarsening. There is cardiomegaly with central vascular congestion. No focal consolidation, pleural effusion or pneumothorax. Median sternotomy wires and CABG vascular clips. Atherosclerotic calcification of the aorta. No acute osseous pathology degenerative changes spine. Partially visualized right shoulder arthroplasty. IMPRESSION: Cardiomegaly with central vascular congestion. No focal consolidation. Electronically Signed   By: Anner Crete M.D.   On: 10/05/2021 20:50   CT Angio Chest/Abd/Pel for Dissection W and/or Wo Contrast  Result Date: 10/05/2021 CLINICAL DATA:  Nausea and vomiting, evaluate for possible mesenteric ischemia. EXAM: CT ANGIOGRAPHY CHEST, ABDOMEN AND PELVIS TECHNIQUE: Non-contrast CT of the chest was initially obtained. Multidetector CT imaging through the chest,  abdomen and pelvis was performed using the standard protocol during bolus administration of intravenous contrast. Multiplanar reconstructed images and MIPs were obtained and reviewed to evaluate the vascular anatomy. CONTRAST:  140mL OMNIPAQUE IOHEXOL 350 MG/ML SOLN COMPARISON:  None. FINDINGS: CTA CHEST FINDINGS Cardiovascular: Initial precontrast images demonstrate atherosclerotic calcification as well as findings of prior coronary bypass grafting. No aneurysmal dilatation is seen. No hyperdense crescent to suggest aortic injury is noted. Post-contrast images show no evidence of dissection. The pulmonary artery shows a normal branching pattern. No evidence of pulmonary emboli are seen. Coronary calcifications and changes of prior coronary bypass are noted. No cardiac enlargement is seen. Aortic valve replacement is noted. No pericardial effusion is noted. Mediastinum/Nodes: Thoracic inlet is within normal limits. No sizable hilar or mediastinal adenopathy is noted. The esophagus as visualized is within normal limits. Lungs/Pleura: Lungs are well aerated bilaterally. No focal infiltrate or sizable effusion is seen. No sizable parenchymal nodules are noted.  Mild scarring is noted in the lower lobes bilaterally. Musculoskeletal: Degenerative changes of the thoracic spine are noted. No rib fractures are seen. No compression deformities are noted. Review of the MIP images confirms the above findings. CTA ABDOMEN AND PELVIS FINDINGS VASCULAR Aorta: Abdominal aorta demonstrates diffuse atherosclerotic calcifications. No aneurysmal dilatation or dissection is noted. Celiac: Atherosclerotic calcifications of the origin of the celiac axis are seen although no focal stenosis is noted. SMA: Atherosclerotic calcification is noted without focal stenosis. Renals: Dual renal arteries are noted on the right with single renal artery on the left. No stenotic changes are seen. IMA: Patent without evidence of aneurysm, dissection,  vasculitis or significant stenosis. Inflow: Iliac arteries demonstrate atherosclerotic calcification without aneurysmal dilatation or dissection. Veins: No specific venous abnormality is noted. Review of the MIP images confirms the above findings. NON-VASCULAR Hepatobiliary: No focal liver abnormality is seen. No gallstones, gallbladder wall thickening, or biliary dilatation. Pancreas: Unremarkable. No pancreatic ductal dilatation or surrounding inflammatory changes. Spleen: Normal in size without focal abnormality. Adrenals/Urinary Tract: Adrenal glands are within normal limits bilaterally. Normal enhancement is noted in the kidneys bilaterally. No renal calculi or obstructive changes are seen. A tiny cyst is noted within the left kidney measuring less than 1 cm. No obstructive changes are seen. The ureters are unremarkable. The bladder is well distended. Stomach/Bowel: Scattered diverticular change of the colon is noted without evidence of diverticulitis. No obstructive changes of the colon are noted. The appendix is not well visualized. No inflammatory changes to suggest appendicitis are noted. The small bowel and stomach are unremarkable. Lymphatic: No sizable lymphadenopathy is noted. Reproductive: Prostate is diminutive likely related to the prior ablation Other: No abdominal wall hernia or abnormality. No abdominopelvic ascites. Musculoskeletal: Degenerative changes of lumbar spine are noted. No acute bony abnormality is seen. Degenerative changes of the sacroiliac joints are noted as well. Review of the MIP images confirms the above findings. IMPRESSION: No evidence of mesenteric ischemia. Diverticulosis without diverticulitis. No acute abnormality noted. Aortic Atherosclerosis (ICD10-I70.0). Electronically Signed   By: Inez Catalina M.D.   On: 10/05/2021 22:23   US Abdomen Limited RUQ (LIVER/GB)  Result Date: 10/06/2021 CLINICAL DATA:  Elevated LFTs. EXAM: ULTRASOUND ABDOMEN LIMITED RIGHT UPPER QUADRANT  COMPARISON:  10/05/2021. FINDINGS: Gallbladder: Sludge is noted in the gallbladder. No gallstones or wall thickening visualized. No sonographic Murphy sign noted by sonographer. Common bile duct: Diameter: 6.3 mm, within normal limits for patient's stated age. Liver: No focal lesion identified. Within normal limits in parenchymal echogenicity. Portal vein is patent on color Doppler imaging with normal direction of blood flow towards the liver. Other: No free fluid. IMPRESSION: Gallbladder sludge without evidence of acute cholecystitis. Electronically Signed   By: Brett Fairy M.D.   On: 10/06/2021 04:48    Cardiac Studies   ECHO: ordered  Patient Profile     87 y.o. male DNR, with dementia, mech AVR 2012, CABG 2008, last MV 2012 ok, DM, dyslipidemia, anxiety, GERD, gout, was admitted 11/21 with fever, +COVID. Cards asked to see for abnl troponin. Procalcitonin 33.42, ferritin 1,321, T bili 4.6  Assessment & Plan    Elevated troponin - peak 1,321 - unclear significance in the setting of COVID and no reported chest pain. - check echo, which will help if we need to adjust meds - no further eval at this time  2. +COVID - ferritin up to 1,321, lactic acid, T bili, and procalcitonin are all higher than on admission - he initially tested +  for COVID 2 weeks ago and completed 5 days Paxlovid - mgt per IM  For questions or updates, please contact [place] Please consult [email or web address] for contact info under        Signed, Rosaria Ferries, PA-C  10/06/2021, 8:41 AM     Attending Addendum:  History and all data above reviewed.  Patient examined.  I agree with the findings as above.  All available labs, radiology testing, previous records reviewed. Agree with documented assessment and plan.  Mr. Gardella is a 87 year old man with a mechanical AVR in 2012, CAD status post CABG, diabetes, hyperlipidemia, dementia, and hypertension admitted with sepsis in the setting of recent COVID-19  infection.  Cardiology was consulted for elevated troponin.  High-sensitivity troponin is elevated, but he has no ischemic symptoms.  He denies chest pain and appears quite well on exam.  It is reasonable to get an echocardiogram.  Unless there are any inabilities on that, would plan to avoid any additional cardiac testing.  Would not treat him as though this were ACS.  His INRs have been consistently therapeutic, making it much less likely that there is mesenteric ischemia, though this cannot be completely ruled out on abdominal imaging.  Management of COVID-19 per IM  Cherene Dobbins C. Oval Linsey, MD, [facility]  10/06/2021 9:27 AM

## 2021-10-06 NOTE — Consult Note (Signed)
[facility] Consult: 8:15 AM 10/07/2021  LOS: 1 day    Referring Provider: Dr Peter Beltran  Primary Care Physician:  Peter Glazier, MD Primary Gastroenterologist: [facility] in [place].  Peter[place]reddine    Reason for Consultation: Elevated LFTs.  E. coli bacteremia.   HPI: Peter Beltran is a 87 y.o. male.  PMH extensively listed below.  Has mild dementia.  On Coumadin for mechanical AVR.  Patient reports having had malaria when he was in his 65s and working in [place].  Previous remote colonoscopies. 11/10/20 " unspecified jaundice" per problem list Initial GI consult with Peter Beltran Reason 06/15/2021 for abnormal LFTs with transaminases 80/60s, bilirubin 3.4 per his note (no cmet corresponding w this found).  LFTs of 04/2021 in Epic were normal.  No alcohol intake, no history of IV drug abuse.  No family history liver dz.  Consideration for cirrhosis, ruled out by 06/18/2021 US showing uncomplicated gallstones, CBD 3.3 mm, normal intrahepatic ducts, normal Dopplers, normal liver parenchyma.  Never returned to GI.  Do not find testing for viral/autoimmune hepatitis, metabolic causes of liver dz.  GGT 10/2020 elevated at 176. Outpatient meds include fenofibrate, Zyloprim, iron, Prilosec  Tested positive for COVID-19 2 weeks ago, treated with 5 days of paxlovid. [facility] ED [facility] with 24 hours of vomiting, diarrhea, fever, chills. Blood culture ID panel is positive for Enterobacterales, E. Coli.  Urinalysis unremarkable. T bili 4.3 ..  5. Alkaline phosphatase normal.  AST/ALT 146/70 ... 105/62. No lipase.  BUN/creatinine 25/1.4, now normalized GFR 43.  Na 132. Troponin Is 146 >> 550 >> 1321 >> 1254.  BNP 427. Hgb 10.3, MCV 95.  Platelets 117.  WBCs 15.2 >> 10.4. INR 3.4 Low TSH,  mildly elevated free T4 Chest x-ray shows vascular congestion, cardiomegaly but no pneumonia.  Diffuse chronic interstitial coarsening. CT angio chest/ABD/PEL: Colonic diverticulosis without diverticulitis.  Liver normal.  Gallstones, gallbladder wall thickening, biliary dilatation not seen.  Unremarkable pancreas and pancreatic duct.  Ocular imaging with diffuse abdominal aortic atherosclerotic calcifications.  Atherosclerotic calcifications at the celiac axis origin and SMA but no focal stenosis Abdominal ultrasound: Gallbladder sludge, no cholecystitis.  CBD 6.3 mm, normal for age.  Dopplers normal 2D echo.  LVEF 60 to 65%.  Grade 2 diastolic dysfunction.  Mild mitral regurg and stenosis.  Mechanical AVR.  Mild dilation of ascending aorta.  Dilated inferior vena cava.  Empiric Rocephin, azithromycin initiated. Nausea, vomiting, diarrhea have resolved.  He does admit to poor appetite, normally he has a pretty good appetite.  He cannot recall the recent nausea, vomiting or diarrhea.  Denies abdominal pain.  Short of breath with speaking, new since the diagnosis of COVID-19.  Patient lives with his wife at [facility] in [place].  He is a retired Editor, commissioning.   Past Medical History:  Diagnosis Date   Acute kidney failure, unspecified ([place]) 07/22/2019   Acute maxillary sinusitis, unspecified 08/16/2017   Allergic rhinitis, unspecified 02/04/2018   Anxiety disorder, unspecified 09/17/2013   Aortic stenosis  post aortic valve replacement with St.Jude's valve by PeterVanTrigt on 07-27-11   Aortic valve disorder 05/29/2013   Atherosclerotic heart disease of native coronary artery without angina pectoris 08/16/2017   Benign prostatic hyperplasia with lower urinary tract symptoms 08/16/2017   Bradycardia with less than 60 beats per minute 04/29/2021   Candidal stomatitis 08/16/2017   Cellulitis, unspecified 07/25/2019   Chronic airway obstruction (HCC) 04/21/2013   Chronic  coronary artery disease 04/10/2016   Formatting of this note might be different from the original. Overview:  status post CABG x3 in 2008 Formatting of this note might be different from the original. Overview:  Overview:  status post CABG x3 in 2008   Chronic rhinitis 12/27/2019   Last Assessment & Plan:  Concern over his nose. Chronic recurring nasal congestion.  Had a severe episode recently that responded to over-the-counter medication.  Currently asymptomatic.  Wants to make sure all is okay. EXAM intranasally shows no polyps, purulence, masses or obstructing anatomy.  Mucosa is overall healthy. PLAN: Reassured all looks okay.  I think what he is doing is fine.  Happy t   Cognitive communication deficit 10/28/2020   Constipation, unspecified 08/16/2017   Coronary artery disease    status post CABG x3 in 2008   Diabetic polyneuropathy associated with type 2 diabetes mellitus ([place]) 10/04/2016   Disorder of the skin and subcutaneous tissue, unspecified 07/25/2019   Disorders of muscle in diseases classified elsewhere, multiple sites 10/28/2020   Disturbance of salivary secretion 04/21/2013   Dry mouth, unspecified 10/18/2018   Dyslipidemia    Dysphagia 04/21/2013   Encounter for current long-term use of anticoagulants 05/29/2013   Formatting of this note might be different from the original. IMO routine update Formatting of this note might be different from the original. Overview:  IMO routine update   Enlarged prostate with lower urinary tract symptoms (LUTS) 05/29/2013   Esophageal reflux 02/26/2014   Essential hypertension 01/19/2019   Fall 04/30/2021   Foreign body aspiration 03/05/2016   Gastro-esophageal reflux disease without esophagitis 02/26/2014   Generalized edema 10/18/2018   Gout    Hidrocystoma of left eyelid 12/10/2014   History of falling 10/28/2020   Hyperlipidemia, unspecified 11/11/2016   Hyperplasia of prostate    benign   Hypersomnia 06/14/2013   Hypertension     Hypertensive heart disease with heart failure ([place]) 10/18/2018   Hypertriglyceridemia 11/11/2016   Hypothyroidism    Hypothyroidism, unspecified 04/10/2016   Impaired fasting glucose 04/21/2013   Iron deficiency anemia, unspecified 08/16/2017   Ischemic optic neuropathy of both eyes 01/28/2016   Localized edema 07/25/2019   Long term (current) use of anticoagulants 05/29/2013   Overview:  IMO routine update IMO routine update   Long term (current) use of aspirin 08/16/2017   Low back pain 08/16/2017   Low-tension glaucoma of both eyes, 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/2016   tmax 12/14 OCT 11/03/2016 VF 12/04/2015 Gonio 05/07/2015 tmax 12/14 OCT 11/03/2016 VF 12/04/2015 Gonio 05/07/2015   Lumbar stenosis 04/30/2015   Memory loss, short term 03/30/2016   Mild cognitive impairment 08/30/2016   Mixed dyslipidemia 01/19/2019   Moderate persistent asthma without complication 82/95/6213   Moderate persistent asthma, uncomplicated 08/65/7846   Monitoring for long-term anticoagulant use 08/03/2017   Muscle weakness (generalized) 10/28/2020   Myocardial infarct [facility])    Nasal congestion 02/04/2018   Neuropathy 04/04/2014   Obstructive sleep apnea 12/17/2013   Obstructive sleep apnea of adult 12/17/2013   Old myocardial infarction 08/22/2019   Oral mucositis (ulcerative),  unspecified 10/28/2020   Osteoarthrosis 04/21/2013   Other malaise 07/25/2019   Other specified disorders of nose and nasal sinuses 10/28/2020   Overweight 01/19/2019   Pain in thoracic spine 10/18/2018   Periodic limb movement disorder 04/30/2021   Pruritus, unspecified 11/10/2020   Pseudophakia of both eyes 12/10/2014   Psoriasis and similar disorder 01/28/2014   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 with seizure like activity.   Traumatic subarachnoid hemorrhage without loss of consciousness, subsequent encounter 10/28/2020   Type 2 diabetes mellitus with diabetic neuropathy, unspecified ([place]) 08/16/2017   Unspecified abnormalities of gait and mobility 10/18/2018   Unspecified jaundice 11/10/2020   Unspecified osteoarthritis, unspecified site 04/21/2013   Unspecified urinary incontinence 05/17/2018   Urinary retention 07/18/2018   Vitamin D deficiency, unspecified 08/16/2017    Past Surgical History:  Procedure Laterality Date   AORTIC VALVE REPLACEMENT     CORONARY ARTERY BYPASS GRAFT     PROSTATE ABLATION     TONSILLECTOMY      Prior to Admission medications   Medication Sig Start Date End Date Taking? Authorizing Provider  acetaminophen (TYLENOL) 650 MG CR tablet Take 650 mg by mouth every 4 (four) hours as needed for pain.   Yes [provider]  albuterol (PROVENTIL HFA;VENTOLIN HFA) 108 (90 Base) MCG/ACT inhaler Inhale 2 puffs into the lungs every 6 (six) hours as needed for wheezing or shortness of breath.   Yes [provider]  allopurinol (ZYLOPRIM) 300 MG tablet Take 300 mg by mouth daily.   Yes [provider]  antiseptic oral rinse (BIOTENE) LIQD 30 mLs by Mouth Rinse route in the morning and at bedtime.   Yes [provider]  clonazePAM (KLONOPIN) 0.5 MG tablet Take 0.5 mg by mouth at bedtime.   Yes [provider]  Docusate Sodium (DSS) 100 MG CAPS Take 100 mg by mouth daily as needed for constipation.   Yes [provider]  ergocalciferol (VITAMIN D2) 1.25 MG (50000 UT) capsule Take 50,000 Units by mouth every Tuesday.   Yes [provider]  fenofibrate (TRICOR) 145 MG tablet Take 145 mg by mouth daily.   Yes [provider]  fluticasone (FLONASE) 50 MCG/ACT nasal  spray Place 1 spray into both nostrils in the morning and at bedtime.   Yes [provider]  Fluticasone-Salmeterol (ADVAIR) 100-50 MCG/DOSE AEPB Inhale 2 puffs into the lungs 2 (two) times daily.   Yes [provider]  gabapentin (NEURONTIN) 100 MG capsule Take 100 mg by mouth 3 (three) times daily.   Yes [provider]  guaiFENesin (ROBITUSSIN) 100 MG/5ML liquid Take 5 mLs by mouth every 4 (four) hours as needed for cough or to loosen phlegm.   Yes [provider]  hydrALAZINE (APRESOLINE) 10 MG tablet Take 10 mg by mouth daily as needed (systolic BP > 770).   Yes [provider]  icosapent Ethyl (VASCEPA) 1 g capsule Take 2 capsules (2 g total) by mouth 2 (two) times daily. 04/23/21  Yes Revankar, Reita Cliche, MD  latanoprost (XALATAN) 0.005 % ophthalmic solution Place 1 drop into both eyes at bedtime.   Yes [provider]  levothyroxine (SYNTHROID) 112 MCG tablet Take 112 mcg by mouth as directed. 6 days a week   Yes [provider]  magic mouthwash (nystatin, hydrocortisone, diphenhydrAMINE) suspension Swish and spit 5 mLs 4 (four) times daily as needed for mouth pain.   Yes [provider]  nitroGLYCERIN (NITROSTAT) 0.4 MG SL tablet Place 0.4 mg under the tongue every 5 (five) minutes as needed for chest pain.   Yes [provider]  omeprazole (PRILOSEC) 20 MG capsule Take 20 mg by mouth 2 (two) times daily before a meal.   Yes [provider]  ondansetron (ZOFRAN) 4 MG tablet Take 4 mg by mouth every 8 (eight) hours as needed for nausea or vomiting.   Yes [provider]  predniSONE (DELTASONE) 20 MG tablet Take 20 mg by mouth daily as needed (acute pain).   Yes [provider]  rOPINIRole (REQUIP) 0.5 MG tablet Take 0.5 mg by mouth daily. 05/19/21  Yes [provider]  rOPINIRole (REQUIP) 1 MG tablet Take 1 mg by mouth daily. 08/03/21  Yes [provider]  SLOW FE 142 (45 Fe)  MG TBCR Take 45 mg by mouth daily. 04/01/21  Yes [provider]  sodium chloride (OCEAN) 0.65 % SOLN nasal spray Place 1 spray into both nostrils daily as needed for congestion.   Yes [provider]  spironolactone (ALDACTONE) 25 MG tablet Take 25 mg by mouth daily.   Yes [provider]  timolol (TIMOPTIC) 0.5 % ophthalmic solution Place 1 drop into both eyes 2 (two) times daily. 12/24/19  Yes [provider]  warfarin (COUMADIN) 3 MG tablet Take 3 mg by mouth See admin instructions. Take 75m on Wednesdays, Saturdays and Sundays,   Yes [provider]  warfarin (COUMADIN) 5 MG tablet Take 5 mg by mouth See admin instructions. Take 525mon Monday, Tuesdays, Thursdays and Fridays. 08/05/21  Yes [provider]    Scheduled Meds:  aspirin  325 mg Oral Daily   warfarin  5 mg Oral ONCE-1600   Warfarin - Pharmacist Dosing Inpatient   Does not apply q1600   Infusions:  cefTRIAXone (ROCEPHIN)  IV 2 g (10/06/21 2200)   PRN Meds: acetaminophen **OR** acetaminophen   Allergies as of 10/05/2021 - Review Complete 10/05/2021  Allergen Reaction Noted   Atorvastatin calcium Other (See Comments) 08/24/2011   Dorzolamide Other (See Comments) 05/06/2017   Brimonidine Other (See Comments) 04/10/2016   Brinzolamide-brimonidine  04/10/2016   Lipitor [atorvastatin calcium]  08/24/2011   Rotigotine Other (See Comments) 04/10/2016   Tamsulosin Other (See Comments) 04/10/2016   Vancomycin  08/24/2011   Zolpidem Other (See Comments) 04/10/2016   Dutasteride Rash 04/10/2016    Family History  Problem Relation Age of Onset   Hypertension Neg Hx    Diabetes Neg Hx    Cancer Neg Hx    Heart disease Neg Hx     Social History   Socioeconomic History   Marital status: Married    Spouse name: Not on file   Number of children: Not on file   Years of education: Not on file   Highest education level: Not on file  Occupational History   Not on file   Tobacco Use   Smoking status: Former   Smokeless tobacco: Never  Substance and Sexual Activity   Alcohol use: No   Drug use: No   Sexual activity: Not on file  Other Topics Concern   Not on file  Social History Narrative  Not on file   Social Determinants of Health   Financial Resource Strain: Not on file  Food Insecurity: Not on file  Transportation Needs: Not on file  Physical Activity: Not on file  Stress: Not on file  Social Connections: Not on file  Intimate Partner Violence: Not on file    REVIEW OF SYSTEMS: Constitutional: Some weakness, not profound. ENT:  No nose bleeds Pulm: Short of breath with longer sentences.  No productive cough. CV:  No palpitations, no LE edema.  GU:  No hematuria, no frequency GI: See HPI. Heme: Denies unusual or excessive bleeding or bruising Transfusions: She may have had transfusions when he had bypass years ago. Neuro:  No headaches, no peripheral tingling or numbness.  Denies syncope or seizures. Derm:  No itching, no rash or sores.  Endocrine:  No sweats or chills.  No polyuria or dysuria Immunization: Had been vaccinated for COVID-19. Travel:  None beyond local counties in last few months.    PHYSICAL EXAM: Vital signs in last 24 hours: Vitals:   10/06/21 2032 10/07/21 0528  BP: (!) 150/61 (!) 180/76  Pulse: 87 91  Resp:    Temp: 99.3 F (37.4 C) 98.8 F (37.1 C)  SpO2: 99% 94%   Wt Readings from Last 3 Encounters:  10/05/21 99.8 kg  06/08/21 93.5 kg  05/04/21 99.8 kg    General: Patient is remarkably well-appearing, does not appear his stated age.  Alert, comfortable and does not look ill Head: No facial asymmetry or swelling.  No signs of head trauma. Eyes: No scleral icterus, no conjunctival pallor. Ears: Slightly hard of hearing. Nose: No congestion or discharge Mouth: Dentures in place.  Mucosa is moist, pink, clear.  Tongue midline. Neck: No JVD, no masses, no thyromegaly Lungs: Clear bilaterally.  No  labored breathing or cough Heart: RRR.  2/6 systolic murmur. Abdomen: Soft without tenderness.  No HSM, masses, bruits, hernias.   Rectal: Deferred Musc/Skeltl: No joint redness, swelling or gross deformity. Extremities: Both feet are warm.  No lower extremity edema. Neurologic: Alert.  Answers questions appropriately, follows commands.  Moves all 4 limbs.  No tremor or display of restless leg activity.  Able to sit up in bed with out assistance. Skin: No rash, no sores, no telangiectasia Nodes: No cervical adenopathy Psych: Calm, pleasant, cooperative.  Fluid speech.  Intake/Output from previous day: 11/22 0701 - 11/23 0700 In: 240 [P.O.:240] Out: 800 [Urine:800] Intake/Output this shift: Total I/O In: -  Out: 700 [Urine:700]  LAB RESULTS: Recent Labs    10/05/21 1903 10/06/21 0413 10/07/21 0228  WBC 11.6* 15.2* 10.4  HGB 12.3* 10.2* 10.3*  HCT 35.7* 30.4* 30.5*  PLT 162 130* 117*   BMET Lab Results  Component Value Date   NA 132 (L) 10/07/2021   NA 133 (L) 10/06/2021   NA 135 10/05/2021   K 3.7 10/07/2021   K 4.2 10/06/2021   K 3.1 (L) 10/05/2021   CL 104 10/07/2021   CL 101 10/06/2021   CL 99 10/05/2021   CO2 19 (L) 10/07/2021   CO2 21 (L) 10/06/2021   CO2 23 10/05/2021   GLUCOSE 135 (H) 10/07/2021   GLUCOSE 168 (H) 10/06/2021   GLUCOSE 143 (H) 10/05/2021   BUN 22 10/07/2021   BUN 25 (H) 10/06/2021   BUN 21 10/05/2021   CREATININE 1.11 10/07/2021   CREATININE 1.49 (H) 10/06/2021   CREATININE 1.21 10/05/2021   CALCIUM 9.0 10/07/2021   CALCIUM 8.8 (L) 10/06/2021  CALCIUM 9.6 10/05/2021   LFT Recent Labs    10/05/21 1903 10/05/21 2100 10/06/21 0413 10/07/21 0228  PROT 6.8  --  6.0* 6.3*  ALBUMIN 3.4*  --  2.8* 2.9*  AST 146*  --  124* 105*  ALT 70*  --  65* 62*  ALKPHOS 47  --  40 57  BILITOT 4.3*  --  4.6* 5.0*  BILIDIR  --  2.6*  --   --    PT/INR Lab Results  Component Value Date   INR 2.9 (H) 10/07/2021   INR 3.4 (H) 10/06/2021   INR  3.3 (H) 10/05/2021   Hepatitis Panel Recent Labs    10/06/21 0413  HEPBSAG NON REACTIVE  HCVAB NON REACTIVE  HEPAIGM NON REACTIVE  HEPBIGM NON REACTIVE   C-Diff No components found for: CDIFF Lipase  No results found for: LIPASE  Drugs of Abuse  No results found for: LABOPIA, COCAINSCRNUR, LABBENZ, AMPHETMU, THCU, LABBARB   RADIOLOGY STUDIES: CT HEAD WO CONTRAST (5MM)  Result Date: 10/06/2021 CLINICAL DATA:  87 year old male with history of delirium. EXAM: CT HEAD WITHOUT CONTRAST TECHNIQUE: Contiguous axial images were obtained from the base of the skull through the vertex without intravenous contrast. COMPARISON:  Head CT 05/04/2021. FINDINGS: Brain: Moderate cerebral atrophy. Patchy and confluent areas of decreased attenuation are noted throughout the deep and periventricular white matter of the cerebral hemispheres bilaterally, compatible with chronic microvascular ischemic disease. More well-defined area of low attenuation in the left thalamus, similar to the prior study, compatible with an old lacunar infarct. Additional small well-defined focus of low attenuation in the superior aspect of the left cerebellar hemisphere also compatible with an old lacunar infarct. No evidence of acute infarction, hemorrhage, hydrocephalus, extra-axial collection or mass lesion/mass effect. Vascular: Multiple vascular calcifications noted in the vertebrobasilar and carotid systems. Skull: Normal. Negative for fracture or focal lesion. Sinuses/Orbits: Multifocal mucosal thickening in the paranasal sinuses bilaterally, with complete opacification of the right frontal sinus, frontoethmoidal recess, and many of the anterior right ethmoid cells. Widespread mucosal thickening is noted in the other ethmoid cells, maxillary sinuses and sphenoid sinuses bilaterally. Small air-fluid level noted in the left maxillary sinus. Other: None. IMPRESSION: 1. No acute intracranial abnormalities. 2. Moderate cerebral atrophy  with extensive chronic microvascular ischemic changes in the cerebral white matter and old lacunar infarcts in the left thalamus and left cerebellar hemisphere, similar to the prior examination. 3. Extensive chronic paranasal sinus disease. Additionally, there is a small air-fluid level in the left maxillary sinus. Clinical correlation for signs and symptoms of acute sinusitis is recommended. Electronically Signed   By: Vinnie Langton M.D.   On: 10/06/2021 06:58   DG Chest Port 1 View  Result Date: 10/05/2021 CLINICAL DATA:  Questionable sepsis. EXAM: PORTABLE CHEST 1 VIEW COMPARISON:  Chest radiograph dated 05/04/2021. FINDINGS: Diffuse chronic interstitial coarsening. There is cardiomegaly with central vascular congestion. No focal consolidation, pleural effusion or pneumothorax. Median sternotomy wires and CABG vascular clips. Atherosclerotic calcification of the aorta. No acute osseous pathology degenerative changes spine. Partially visualized right shoulder arthroplasty. IMPRESSION: Cardiomegaly with central vascular congestion. No focal consolidation. Electronically Signed   By: Anner Crete M.D.   On: 10/05/2021 20:50   ECHOCARDIOGRAM COMPLETE  Result Date: 10/06/2021    ECHOCARDIOGRAM REPORT   Patient Name:   Peter Beltran Date of Exam: 10/06/2021 Medical Rec #:  [id number]           Height:       68.0  in Accession #:    [id number]          Weight:       220.0 lb Date of Birth:  10-Dec-1924            BSA:          2.128 m Patient Age:    89 years            BP:           114/48 mmHg Patient Gender: M                   HR:           74 bpm. Exam Location:  Inpatient Procedure: 2D Echo, Cardiac Doppler, Color Doppler and Intracardiac            Opacification Agent Indications:    Elevated troponin  History:        Patient has prior history of Echocardiogram examinations, most                 recent 01/05/2021. CAD, Signs/Symptoms:Syncope; Risk                 Factors:Dyslipidemia.   Sonographer:    Glo Herring Referring Phys: [id number] [place]  1. Left ventricular ejection fraction, by estimation, is 60 to 65%. The left ventricle has normal function. The left ventricle has no regional wall motion abnormalities. There is mild left ventricular hypertrophy. Left ventricular diastolic parameters are consistent with Grade II diastolic dysfunction (pseudonormalization).  2. Right ventricular systolic function is mildly reduced. The right ventricular size is mildly enlarged.  3. Left atrial size was mild to moderately dilated.  4. Right atrial size was moderately dilated.  5. Moderate mitral valve regurgitation, ERO 0.3 cm^2. Mild to moderate mitral stenosis. The mean mitral valve gradient is 6.0 mmHg with MVA 1.68 cm^2 by VTI. Moderate mitral annular calcification.  6. Mechanical aortic valve. Mean gradient 16 mmHg with EOA 1.5 cm^2, DI 0.44. No aortic insufficiency noted.  7. Aortic dilatation noted. There is mild dilatation of the ascending aorta, measuring 38 mm.  8. The inferior vena cava is dilated in size with <50% respiratory variability, suggesting right atrial pressure of 15 mmHg. FINDINGS  Left Ventricle: Left ventricular ejection fraction, by estimation, is 60 to 65%. The left ventricle has normal function. The left ventricle has no regional wall motion abnormalities. The left ventricular internal cavity size was normal in size. There is  mild left ventricular hypertrophy. Left ventricular diastolic parameters are consistent with Grade II diastolic dysfunction (pseudonormalization). Right Ventricle: The right ventricular size is mildly enlarged. No increase in right ventricular wall thickness. Right ventricular systolic function is mildly reduced. Left Atrium: Left atrial size was mild to moderately dilated. Right Atrium: Right atrial size was moderately dilated. Pericardium: There is no evidence of pericardial effusion. Mitral Valve: The mitral valve is  degenerative in appearance. There is moderate calcification of the mitral valve leaflet(s). Moderate mitral annular calcification. Moderate mitral valve regurgitation. Mild to moderate mitral valve stenosis. MV peak gradient, 11.7 mmHg. The mean mitral valve gradient is 6.0 mmHg. Tricuspid Valve: The tricuspid valve is normal in structure. Tricuspid valve regurgitation is mild. Aortic Valve: Mechanical aortic valve. Mean gradient 16 mmHg with EOA 1.5 cm^2, DI 0.44. No aortic insufficiency noted. The aortic valve was not well visualized. Aortic valve regurgitation is not visualized. Aortic valve mean gradient measures 15.0 mmHg.  Aortic valve peak gradient measures 27.7 mmHg.  Aortic valve area, by VTI measures 1.41 cm. Pulmonic Valve: The pulmonic valve was normal in structure. Pulmonic valve regurgitation is trivial. Aorta: The aortic root is normal in size and structure and aortic dilatation noted. There is mild dilatation of the ascending aorta, measuring 38 mm. Venous: The inferior vena cava is dilated in size with less than 50% respiratory variability, suggesting right atrial pressure of 15 mmHg. IAS/Shunts: No atrial level shunt detected by color flow Doppler.  LEFT VENTRICLE PLAX 2D LVIDd:         5.70 cm   Diastology LVIDs:         3.90 cm   LV e' medial:    3.48 cm/s LV PW:         1.40 cm   LV E/e' medial:  52.6 LV IVS:        1.40 cm   LV e' lateral:   3.92 cm/s LVOT diam:     2.00 cm   LV E/e' lateral: 46.7 LV SV:         76 LV SV Index:   36 LVOT Area:     3.14 cm  RIGHT VENTRICLE            IVC RV Basal diam:  4.00 cm    IVC diam: 2.50 cm RV S prime:     6.64 cm/s LEFT ATRIUM             Index        RIGHT ATRIUM           Index LA diam:        5.60 cm 2.63 cm/m   RA Area:     26.20 cm LA Vol (A2C):   83.8 ml 39.38 ml/m  RA Volume:   79.60 ml  37.40 ml/m LA Vol (A4C):   85.0 ml 39.94 ml/m LA Biplane Vol: 84.4 ml 39.66 ml/m  AORTIC VALVE                     PULMONIC VALVE AV Area (Vmax):    1.52  cm      PV Vmax:       0.91 m/s AV Area (Vmean):   1.42 cm      PV Peak grad:  3.3 mmHg AV Area (VTI):     1.41 cm AV Vmax:           263.33 cm/s AV Vmean:          184.333 cm/s AV VTI:            0.541 m AV Peak Grad:      27.7 mmHg AV Mean Grad:      15.0 mmHg LVOT Vmax:         127.50 cm/s LVOT Vmean:        83.350 cm/s LVOT VTI:          0.242 m LVOT/AV VTI ratio: 0.45  AORTA Ao Root diam: 3.30 cm Ao Asc diam:  3.80 cm MITRAL VALVE                  TRICUSPID VALVE MV Area (PHT): 3.45 cm       TR Peak grad:   33.4 mmHg MV Area VTI:   1.62 cm       TR Vmax:        289.00 cm/s MV Peak grad:  11.7 mmHg MV Mean grad:  6.0 mmHg       SHUNTS  MV Vmax:       1.71 m/s       Systemic VTI:  0.24 m MV Vmean:      113.3 cm/s     Systemic Diam: 2.00 cm MV Decel Time: 220 msec MR Peak grad:    92.5 mmHg MR Mean grad:    63.0 mmHg MR Vmax:         481.00 cm/s MR Vmean:        382.0 cm/s MR PISA:         4.02 cm MR PISA Eff ROA: 30 mm MR PISA Radius:  0.80 cm MV E velocity: 183.00 cm/s MV A velocity: 139.00 cm/s MV E/A ratio:  1.32 Dalton McleanMD Electronically signed by Franki Monte Signature Date/Time: 10/06/2021/12:22:54 PM    Final    CT Angio Chest/Abd/Pel for Dissection W and/or Wo Contrast  Result Date: 10/05/2021 CLINICAL DATA:  Nausea and vomiting, evaluate for possible mesenteric ischemia. EXAM: CT ANGIOGRAPHY CHEST, ABDOMEN AND PELVIS TECHNIQUE: Non-contrast CT of the chest was initially obtained. Multidetector CT imaging through the chest, abdomen and pelvis was performed using the standard protocol during bolus administration of intravenous contrast. Multiplanar reconstructed images and MIPs were obtained and reviewed to evaluate the vascular anatomy. CONTRAST:  182mL OMNIPAQUE IOHEXOL 350 MG/ML SOLN COMPARISON:  None. FINDINGS: CTA CHEST FINDINGS Cardiovascular: Initial precontrast images demonstrate atherosclerotic calcification as well as findings of prior coronary bypass grafting. No aneurysmal  dilatation is seen. No hyperdense crescent to suggest aortic injury is noted. Post-contrast images show no evidence of dissection. The pulmonary artery shows a normal branching pattern. No evidence of pulmonary emboli are seen. Coronary calcifications and changes of prior coronary bypass are noted. No cardiac enlargement is seen. Aortic valve replacement is noted. No pericardial effusion is noted. Mediastinum/Nodes: Thoracic inlet is within normal limits. No sizable hilar or mediastinal adenopathy is noted. The esophagus as visualized is within normal limits. Lungs/Pleura: Lungs are well aerated bilaterally. No focal infiltrate or sizable effusion is seen. No sizable parenchymal nodules are noted. Mild scarring is noted in the lower lobes bilaterally. Musculoskeletal: Degenerative changes of the thoracic spine are noted. No rib fractures are seen. No compression deformities are noted. Review of the MIP images confirms the above findings. CTA ABDOMEN AND PELVIS FINDINGS VASCULAR Aorta: Abdominal aorta demonstrates diffuse atherosclerotic calcifications. No aneurysmal dilatation or dissection is noted. Celiac: Atherosclerotic calcifications of the origin of the celiac axis are seen although no focal stenosis is noted. SMA: Atherosclerotic calcification is noted without focal stenosis. Renals: Dual renal arteries are noted on the right with single renal artery on the left. No stenotic changes are seen. IMA: Patent without evidence of aneurysm, dissection, vasculitis or significant stenosis. Inflow: Iliac arteries demonstrate atherosclerotic calcification without aneurysmal dilatation or dissection. Veins: No specific venous abnormality is noted. Review of the MIP images confirms the above findings. NON-VASCULAR Hepatobiliary: No focal liver abnormality is seen. No gallstones, gallbladder wall thickening, or biliary dilatation. Pancreas: Unremarkable. No pancreatic ductal dilatation or surrounding inflammatory changes.  Spleen: Normal in size without focal abnormality. Adrenals/Urinary Tract: Adrenal glands are within normal limits bilaterally. Normal enhancement is noted in the kidneys bilaterally. No renal calculi or obstructive changes are seen. A tiny cyst is noted within the left kidney measuring less than 1 cm. No obstructive changes are seen. The ureters are unremarkable. The bladder is well distended. Stomach/Bowel: Scattered diverticular change of the colon is noted without evidence of diverticulitis. No obstructive changes of the colon are noted.  The appendix is not well visualized. No inflammatory changes to suggest appendicitis are noted. The small bowel and stomach are unremarkable. Lymphatic: No sizable lymphadenopathy is noted. Reproductive: Prostate is diminutive likely related to the prior ablation Other: No abdominal wall hernia or abnormality. No abdominopelvic ascites. Musculoskeletal: Degenerative changes of lumbar spine are noted. No acute bony abnormality is seen. Degenerative changes of the sacroiliac joints are noted as well. Review of the MIP images confirms the above findings. IMPRESSION: No evidence of mesenteric ischemia. Diverticulosis without diverticulitis. No acute abnormality noted. Aortic Atherosclerosis (ICD10-I70.0). Electronically Signed   By: Inez Catalina M.D.   On: 10/05/2021 22:23   US Abdomen Limited RUQ (LIVER/GB)  Result Date: 10/06/2021 CLINICAL DATA:  Elevated LFTs. EXAM: ULTRASOUND ABDOMEN LIMITED RIGHT UPPER QUADRANT COMPARISON:  10/05/2021. FINDINGS: Gallbladder: Sludge is noted in the gallbladder. No gallstones or wall thickening visualized. No sonographic Murphy sign noted by sonographer. Common bile duct: Diameter: 6.3 mm, within normal limits for patient's stated age. Liver: No focal lesion identified. Within normal limits in parenchymal echogenicity. Portal vein is patent on color Doppler imaging with normal direction of blood flow towards the liver. Other: No free fluid.  IMPRESSION: Gallbladder sludge without evidence of acute cholecystitis. Electronically Signed   By: Brett Fairy M.D.   On: 10/06/2021 04:48      IMPRESSION:      Elevated LFTs dating back several months w ultrasounds showing only simple gallstones.  No aggressive lab eval pursued thus far.  Current LFT elevation in setting of recent breakthrough Covid 19 infection complicates interpretation of these lab abnormalities.  Meds could certainly be a culprit (fenofibrate, Zyloprim stand out).      Vomiting, diarrhea, chills on admission.  Initial suspicion of mesenteric ischemia,  CTA shows some mesenteric vsl calcification but not stenosis or ischemia.  Normally ischemia presents w significant abdominal pain, not described at intake though was tender on exam.  Sxs resolved but met sepsis criteria and E coli, enterobacter PCR + on blood clx  panel, empiric abx in place.      Covid 19 infection treated w Paxlovid ~ 2 wks ago.  Elevated ferritin, fluctuating but overall improving trend lactic acid.   CXR clear.      Marked elevation Troponins.  "unclear significance in the setting of COVID and no reported chest pain" per Dr Oval Linsey.       Mechanical AVR.  Chronic Coumadin, therapeutic INR.       [place] anemia.  On iron at home.      Hypothyroidism.  On synthroid.  Slightly low TSH and mild elevation free t4.      PLAN:       Dc orders for stool path pcr testing, diarrhea has resolved.      Would have his PCP recheck LFTs in coming weeks and refer back to GI Dr Beltran Reason in [place] for further eval if still elevated.     Azucena Freed  10/07/2021, 8:15 AM Phone [phone]

## 2021-10-06 NOTE — Progress Notes (Addendum)
PROGRESS NOTE    Peter Beltran  WIO:[id number] DOB: Feb 14, 1925 DOA: 10/05/2021 PCP: Javier Glazier, MD   Brief Narrative: 87 year old with past medical history significant for mild cognitive impairment, diabetes type 2 diet controlled, CAD status post CABG times 01/2007, hypertension, hyperlipidemia, CKD stage III AAA, aortic stenosis status post mechanical AVR on Coumadin, bradycardia, BPH, gout, hypothyroidism, presents to the ED from nursing home for evaluation of fevers, malaise, poor oral intake, vomiting and diarrhea for 1 day.  Patient recently diagnosed with COVID, 2 weeks prior to admission and treated with 5 days course of Paxlovid.  Evaluation in the ED: Patient qualifies most  SIRS criteria.  He was found to have lactic acidosis, transaminases, elevation of troponin, elevation BNP, OxyContin 20.  Chest x-ray showed cardiomegaly with central vascular congestion.  CT angio chest abdomen pelvis negative for acute finding.  Cardiology was consulted in the ED as well.  Patient was admitted for altered mental status, sepsis.   Assessment & Plan:   Principal Problem:   Severe sepsis [facility]) Active Problems:   Coronary artery disease   Lab test positive for detection of COVID-19 virus   Elevated troponin   Vomiting and diarrhea   1-SIRS; Sepsis secondary to gastroenteritis. E coli Bacteremia Sepsis POA.  Patient presented with fever, tachycardia, leukocytosis, lactic acidosis. Recent COVID infection 2 weeks prior to admission, treated with a course of PACs elevated. Patient presented with nausea vomiting and diarrhea.  Could be related to gastroenteritis. CT chest: No focal infiltrates, no PE, no evidence of dissection.  CT abdomen and pelvis: No evidence of mesenteric ischemia, diverticulosis without diverticulitis.  No other acute abnormality. Procalcitonin elevated.  We will continue with antibiotics coverage for now. Follow urine and blood cultures C Diff.  and GI  pathogen ordered. Blood Cultures growing E. coli, antibiotics changed to ceftriaxone.  2-Elevation of troponin CAD status post CABG times 01/2007. Troponin 146--550. EKG without acute changes. CT angio negative for PE. Echo ordered to rule out myocarditis. Cardiology  has been consulted.  3-Vomiting and diarrhea: Related to gastroenteritis CT abdomen and pelvis: CDF and GI pathogen ordered. Support care. Start full liquid diet.   Acute metabolic encephalopathy: History of mild cognitive impairment. CT head: No acute intracranial normalities moderate cerebral atrophy, with extensive chronic microvascular ischemic changes.  Paraspinal spinal disease. TSH: B12: 222.  We will start supplement Ammonia: 18 normal  Hypokalemia, hypomagnesemia, prolonged QT: Replete.   Transaminases: Hyperbilirubinemia.  Hepatitis panel: Negative Right upper quadrant ultrasound: Gallbladder sludge without evidence of acute cholecystitis. Discussed with daughter, patient had recent episode of increase LFT and hyperbilirubinemia improved over time.  -I have consulted GI>  -Suspect related to sepsis, but will consult GI.  -Alkaline phosphatase is normal.   Hypertension: Normotensive. Hold spironolactone and hydralazine.  Diabetes type 2 diet controlled: A1c 6.0  CKD stage IIIa: Previous creatinine 0.8--1.2. Stable.  Monitor.  History of mechanical AVR: Continue with Coumadin  Hypothyroidism: TSH: Low 0.24 Free T-4: 1.3 mildly elevated Await  free T3. Await free T3 results to resume Synthroid   Estimated body mass index is 33.45 kg/m as calculated from the following:   Height as of this encounter: 5\' 8"  (1.727 m).   Weight as of this encounter: 99.8 kg.   DVT prophylaxis: Coumadin  Code Status: DNR Family Communication: Daughter Updated over phone/  Disposition Plan:  Status is: Inpatient  Remains inpatient appropriate because: sepsis, bacteremia.         Consultants:   Cardiology  Procedures:  ECHO; Left ventricular ejection fraction, by estimation, is 60 to 65%. The  left ventricle has normal function. The left ventricle has no regional  wall motion abnormalities. There is mild left ventricular hypertrophy.  Left ventricular diastolic parameters  are consistent with Grade II diastolic dysfunction (pseudonormalization).   2. Right ventricular systolic function is mildly reduced. The right  ventricular size is mildly enlarged.   3. Left atrial size was mild to moderately dilated.   4. Right atrial size was moderately dilated.   5. Moderate mitral valve regurgitation, ERO 0.3 cm^2. Mild to moderate  mitral stenosis. The mean mitral valve gradient is 6.0 mmHg with MVA 1.68  cm^2 by VTI. Moderate mitral annular calcification.   6. Mechanical aortic valve. Mean gradient 16 mmHg with EOA 1.5 cm^2, DI  0.44. No aortic insufficiency noted.   7. Aortic dilatation noted. There is mild dilatation of the ascending  aorta, measuring 38 mm.   8. The inferior vena cava is dilated in size with <50% respiratory  variability, suggesting right atrial pressure of 15 mmHg.   Antimicrobials:    Subjective: He is alert, he doesn't know why he is in the hospital. He feels well. He denies abdominal pain.   Objective: Vitals:   10/06/21 0417 10/06/21 0500 10/06/21 0600 10/06/21 0640  BP:  (!) 106/51 (!) 112/52 128/65  Pulse:  66 67 75  Resp:  16 17 (!) 21  Temp: 98.3 F (36.8 C) (!) 97.2 F (36.2 C)    TempSrc: Oral Axillary    SpO2:  96% 98% 98%  Weight:      Height:        Intake/Output Summary (Last 24 hours) at 10/06/2021 0726 Last data filed at 10/06/2021 0203 Gross per 24 hour  Intake 1288.46 ml  Output --  Net 1288.46 ml   Filed Weights   10/05/21 1857  Weight: 99.8 kg    Examination:  General exam: Appears calm and comfortable  Respiratory system: Clear to auscultation. Respiratory effort normal. Cardiovascular system: S1 & S2 heard,  RRR. Click valve present.  Gastrointestinal system: Abdomen is nondistended, soft and nontender. No organomegaly or masses felt. Normal bowel sounds heard. Central nervous system: Alert and oriented. No focal neurological deficits. Extremities: Symmetric 5 x 5 power.   Data Reviewed: I have personally reviewed following labs and imaging studies  CBC: Recent Labs  Lab 10/05/21 1903 10/06/21 0413  WBC 11.6* 15.2*  NEUTROABS 10.6*  --   HGB 12.3* 10.2*  HCT 35.7* 30.4*  MCV 94.9 95.9  PLT 162 431*   Basic Metabolic Panel: Recent Labs  Lab 10/05/21 1903 10/05/21 2225 10/06/21 0413  NA 135  --  133*  K 3.1*  --  4.2  CL 99  --  101  CO2 23  --  21*  GLUCOSE 143*  --  168*  BUN 21  --  25*  CREATININE 1.21  --  1.49*  CALCIUM 9.6  --  8.8*  MG  --  1.4* 1.9   GFR: Estimated Creatinine Clearance: 34.7 mL/min (A) (by C-G formula based on SCr of 1.49 mg/dL (H)). Liver Function Tests: Recent Labs  Lab 10/05/21 1903 10/06/21 0413  AST 146* 124*  ALT 70* 65*  ALKPHOS 47 40  BILITOT 4.3* 4.6*  PROT 6.8 6.0*  ALBUMIN 3.4* 2.8*   No results for input(s): LIPASE, AMYLASE in the last 168 hours. Recent Labs  Lab 10/06/21 0431  AMMONIA 18   Coagulation Profile: Recent  Labs  Lab 10/05/21 1903 10/06/21 0413  INR 3.3* 3.4*   Cardiac Enzymes: No results for input(s): CKTOTAL, CKMB, CKMBINDEX, TROPONINI in the last 168 hours. BNP (last 3 results) No results for input(s): PROBNP in the last 8760 hours. HbA1C: Recent Labs    10/06/21 0450  HGBA1C 6.0*   CBG: No results for input(s): GLUCAP in the last 168 hours. Lipid Profile: No results for input(s): CHOL, HDL, LDLCALC, TRIG, CHOLHDL, LDLDIRECT in the last 72 hours. Thyroid Function Tests: Recent Labs    10/06/21 0450  TSH 0.247*   Anemia Panel: Recent Labs    10/05/21 2225 10/06/21 0413  VITAMINB12  --  222  FERRITIN 1,026*  --    Sepsis Labs: Recent Labs  Lab 10/05/21 1903 10/05/21 2103  10/05/21 2225 10/06/21 0152 10/06/21 0413  PROCALCITON  --   --  20.18  --  33.42  LATICACIDVEN 3.6* 3.9*  --  4.4*  --     Recent Results (from the past 240 hour(s))  Resp Panel by RT-PCR (Flu A&B, Covid) Nasopharyngeal Swab     Status: Abnormal   Collection Time: 10/05/21  7:03 PM   Specimen: Nasopharyngeal Swab; Nasopharyngeal(NP) swabs in vial transport medium  Result Value Ref Range Status   SARS Coronavirus 2 by RT PCR POSITIVE (A) NEGATIVE Final    Comment: RESULT CALLED TO, READ BACK BY AND VERIFIED WITH: RN SARAH N. 10/05/21@21 :17 BY TW (NOTE) SARS-CoV-2 target nucleic acids are DETECTED.  The SARS-CoV-2 RNA is generally detectable in upper respiratory specimens during the acute phase of infection. Positive results are indicative of the presence of the identified virus, but do not rule out bacterial infection or co-infection with other pathogens not detected by the test. Clinical correlation with patient history and other diagnostic information is necessary to determine patient infection status. The expected result is Negative.  Fact Sheet for Patients: EntrepreneurPulse.com.au  Fact Sheet for Healthcare Providers: IncredibleEmployment.be  This test is not yet approved or cleared by the [place] FDA and  has been authorized for detection and/or diagnosis of SARS-CoV-2 by FDA under an Emergency Use Authorization (EUA).  This EUA will remain in effect (meaning this test can be  used) for the duration of  the COVID-19 declaration under Section 564(b)(1) of the Act, 21 U.S.C. section 360bbb-3(b)(1), unless the authorization is terminated or revoked sooner.     Influenza A by PCR NEGATIVE NEGATIVE Final   Influenza B by PCR NEGATIVE NEGATIVE Final    Comment: (NOTE) The Xpert Xpress SARS-CoV-2/FLU/RSV plus assay is intended as an aid in the diagnosis of influenza from Nasopharyngeal swab specimens and should not be used as a  sole basis for treatment. Nasal washings and aspirates are unacceptable for Xpert Xpress SARS-CoV-2/FLU/RSV testing.  Fact Sheet for Patients: EntrepreneurPulse.com.au  Fact Sheet for Healthcare Providers: IncredibleEmployment.be  This test is not yet approved or cleared by the [place] FDA and has been authorized for detection and/or diagnosis of SARS-CoV-2 by FDA under an Emergency Use Authorization (EUA). This EUA will remain in effect (meaning this test can be used) for the duration of the COVID-19 declaration under Section 564(b)(1) of the Act, 21 U.S.C. section 360bbb-3(b)(1), unless the authorization is terminated or revoked.  Performed at [facility], [place] [address], [place], [place] [address]          Radiology Studies: CT HEAD WO CONTRAST (5MM)  Result Date: 10/06/2021 CLINICAL DATA:  87 year old male with history of delirium. EXAM: CT HEAD  WITHOUT CONTRAST TECHNIQUE: Contiguous axial images were obtained from the base of the skull through the vertex without intravenous contrast. COMPARISON:  Head CT 05/04/2021. FINDINGS: Brain: Moderate cerebral atrophy. Patchy and confluent areas of decreased attenuation are noted throughout the deep and periventricular white matter of the cerebral hemispheres bilaterally, compatible with chronic microvascular ischemic disease. More well-defined area of low attenuation in the left thalamus, similar to the prior study, compatible with an old lacunar infarct. Additional small well-defined focus of low attenuation in the superior aspect of the left cerebellar hemisphere also compatible with an old lacunar infarct. No evidence of acute infarction, hemorrhage, hydrocephalus, extra-axial collection or mass lesion/mass effect. Vascular: Multiple vascular calcifications noted in the vertebrobasilar and carotid systems. Skull: Normal. Negative for fracture or focal lesion. Sinuses/Orbits: Multifocal  mucosal thickening in the paranasal sinuses bilaterally, with complete opacification of the right frontal sinus, frontoethmoidal recess, and many of the anterior right ethmoid cells. Widespread mucosal thickening is noted in the other ethmoid cells, maxillary sinuses and sphenoid sinuses bilaterally. Small air-fluid level noted in the left maxillary sinus. Other: None. IMPRESSION: 1. No acute intracranial abnormalities. 2. 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 3. Extensive chronic paranasal sinus disease. Additionally, there is a small air-fluid level in the left maxillary sinus. Clinical correlation for signs and symptoms of acute sinusitis is recommended. Electronically Signed   By: Vinnie Langton M.D.   On: 10/06/2021 06:58   DG Chest Port 1 View  Result Date: 10/05/2021 CLINICAL DATA:  Questionable sepsis. EXAM: PORTABLE CHEST 1 VIEW COMPARISON:  Chest radiograph dated 05/04/2021. FINDINGS: Diffuse chronic interstitial coarsening. There is cardiomegaly with central vascular congestion. No focal consolidation, pleural effusion or pneumothorax. Median sternotomy wires and CABG vascular clips. Atherosclerotic calcification of the aorta. No acute osseous pathology degenerative changes spine. Partially visualized right shoulder arthroplasty. IMPRESSION: Cardiomegaly with central vascular congestion. No focal consolidation. Electronically Signed   By: Anner Crete M.D.   On: 10/05/2021 20:50   CT Angio Chest/Abd/Pel for Dissection W and/or Wo Contrast  Result Date: 10/05/2021 CLINICAL DATA:  Nausea and vomiting, evaluate for possible mesenteric ischemia. EXAM: CT ANGIOGRAPHY CHEST, ABDOMEN AND PELVIS TECHNIQUE: Non-contrast CT of the chest was initially obtained. Multidetector CT imaging through the chest, abdomen and  pelvis was performed using the standard protocol during bolus administration of intravenous contrast. Multiplanar reconstructed images and MIPs were obtained and reviewed to evaluate the vascular anatomy. CONTRAST:  123mL OMNIPAQUE IOHEXOL 350 MG/ML SOLN COMPARISON:  None. FINDINGS: CTA CHEST FINDINGS Cardiovascular: Initial precontrast images demonstrate atherosclerotic calcification as well as findings of prior coronary bypass grafting. No aneurysmal dilatation is seen. No hyperdense crescent to suggest aortic injury is noted. Post-contrast images show no evidence of dissection. The pulmonary artery shows a normal branching pattern. No evidence of pulmonary emboli are seen. Coronary calcifications and changes of prior coronary bypass are noted. No cardiac enlargement is seen. Aortic valve replacement is noted. No pericardial effusion is noted. Mediastinum/Nodes: Thoracic inlet is within normal limits. No sizable hilar or mediastinal adenopathy is noted. The esophagus as visualized is within normal limits. Lungs/Pleura: Lungs are well aerated bilaterally. No focal infiltrate or sizable effusion is seen. No sizable parenchymal nodules are noted. Mild scarring is noted in the lower lobes bilaterally. Musculoskeletal: Degenerative changes of the thoracic spine are noted. No rib fractures are seen. No compression  deformities are noted. Review of the MIP images confirms the above findings. CTA ABDOMEN AND PELVIS FINDINGS VASCULAR Aorta: Abdominal aorta demonstrates diffuse atherosclerotic calcifications. No aneurysmal dilatation or dissection is noted. Celiac: Atherosclerotic calcifications of the origin of the celiac axis are seen although no focal stenosis is noted. SMA: Atherosclerotic calcification is noted without focal stenosis. Renals: Dual renal arteries are noted on the right with single renal artery on the left. No stenotic changes are seen. IMA: Patent without evidence of aneurysm, dissection, vasculitis or  significant stenosis. Inflow: Iliac arteries demonstrate atherosclerotic calcification without aneurysmal dilatation or dissection. Veins: No specific venous abnormality is noted. Review of the MIP images confirms the above findings. NON-VASCULAR Hepatobiliary: No focal liver abnormality is seen. No gallstones, gallbladder wall thickening, or biliary dilatation. Pancreas: Unremarkable. No pancreatic ductal dilatation or surrounding inflammatory changes. Spleen: Normal in size without focal abnormality. Adrenals/Urinary Tract: Adrenal glands are within normal limits bilaterally. Normal enhancement is noted in the kidneys bilaterally. No renal calculi or obstructive changes are seen. A tiny cyst is noted within the left kidney measuring less than 1 cm. No obstructive changes are seen. The ureters are unremarkable. The bladder is well distended. Stomach/Bowel: Scattered diverticular change of the colon is noted without evidence of diverticulitis. No obstructive changes of the colon are noted. The appendix is not well visualized. No inflammatory changes to suggest appendicitis are noted. The small bowel and stomach are unremarkable. Lymphatic: No sizable lymphadenopathy is noted. Reproductive: Prostate is diminutive likely related to the prior ablation Other: No abdominal wall hernia or abnormality. No abdominopelvic ascites. Musculoskeletal: Degenerative changes of lumbar spine are noted. No acute bony abnormality is seen. Degenerative changes of the sacroiliac joints are noted as well. Review of the MIP images confirms the above findings. IMPRESSION: No evidence of mesenteric ischemia. Diverticulosis without diverticulitis. No acute abnormality noted. Aortic Atherosclerosis (ICD10-I70.0). Electronically Signed   By: Inez Catalina M.D.   On: 10/05/2021 22:23   US Abdomen Limited RUQ (LIVER/GB)  Result Date: 10/06/2021 CLINICAL DATA:  Elevated LFTs. EXAM: ULTRASOUND ABDOMEN LIMITED RIGHT UPPER QUADRANT COMPARISON:   10/05/2021. FINDINGS: Gallbladder: Sludge is noted in the gallbladder. No gallstones or wall thickening visualized. No sonographic Murphy sign noted by sonographer. Common bile duct: Diameter: 6.3 mm, within normal limits for patient's stated age. Liver: No focal lesion identified. Within normal limits in parenchymal echogenicity. Portal vein is patent on color Doppler imaging with normal direction of blood flow towards the liver. Other: No free fluid. IMPRESSION: Gallbladder sludge without evidence of acute cholecystitis. Electronically Signed   By: Brett Fairy M.D.   On: 10/06/2021 04:48        Scheduled Meds:  aspirin  325 mg Oral Daily   Continuous Infusions:  ceFEPime (MAXIPIME) IV     lactated ringers 75 mL/hr at 10/06/21 0423   linezolid (ZYVOX) IV Stopped (10/06/21 0558)   metronidazole Stopped (10/06/21 0644)     LOS: 0 days    Time spent: 35 minutes.     Elmarie Shiley, MD [facility]   If 7PM-7AM, please contact night-coverage [email or web address]  10/06/2021, 7:26 AM

## 2021-10-06 NOTE — ED Notes (Signed)
Patient transported to CT scan . 

## 2021-10-06 NOTE — Progress Notes (Addendum)
ANTICOAGULATION CONSULT NOTE - Initial Consult  Pharmacy Consult for Warfarin  Indication: Mechanical AVR  Allergies  Allergen Reactions   Atorvastatin Calcium Other (See Comments)    Developed myopathy.     Dorzolamide Other (See Comments)    Follicula     Brimonidine Other (See Comments)    Red and burning eyes   Brinzolamide-Brimonidine     Redness in eyes - reaction to [place] [Atorvastatin Calcium]     Developed myopathy.   Rotigotine Other (See Comments)    Falling asleep uncontrollably - reaction to Neupro   Tamsulosin Other (See Comments)    Unknown reaction (Flomax)   Vancomycin     Red Rash   Zolpidem Other (See Comments)    Pt. states that he possibly had a neuro reaction.   Dutasteride Rash    Reaction to Avodart    Patient Measurements: Height: 5\' 8"  (172.7 cm) Weight: 99.8 kg (220 lb) IBW/kg (Calculated) : 68.4  Vital Signs: Temp: 98.3 F (36.8 C) (11/22 0417) Temp Source: Oral (11/22 0417) BP: 114/48 (11/22 0400) Pulse Rate: 68 (11/22 0400)  Labs: Recent Labs    10/05/21 1903 10/05/21 1937 10/05/21 2225  HGB 12.3*  --   --   HCT 35.7*  --   --   PLT 162  --   --   APTT 44*  --   --   LABPROT 33.7*  --   --   INR 3.3*  --   --   CREATININE 1.21  --   --   TROPONINIHS  --  146* 550*    Estimated Creatinine Clearance: 42.8 mL/min (by C-G formula based on SCr of 1.21 mg/dL).   Medical History: Past Medical History:  Diagnosis Date   Acute kidney failure, unspecified ([place]) 07/22/2019   Acute maxillary sinusitis, unspecified 08/16/2017   Allergic rhinitis, unspecified 02/04/2018   Anxiety disorder, unspecified 09/17/2013   Anxiety state 09/17/2013   Aortic stenosis    post aortic valve replacement with St.Jude's valve by Dr.VanTrigt on 07-27-11   Aortic valve disorder 05/29/2013   Atherosclerotic heart disease of native coronary artery without angina pectoris 08/16/2017   Benign prostatic hyperplasia with lower urinary  tract symptoms 08/16/2017   Bradycardia with less than 60 beats per minute 04/29/2021   Candidal stomatitis 08/16/2017   Cellulitis, unspecified 07/25/2019   Chronic airway obstruction ([place]) 04/21/2013   Chronic coronary artery disease 04/10/2016   Formatting of this note might be different from the original. Overview:  status post CABG x3 in 2008 Formatting of this note might be different from the original. Overview:  Overview:  status post CABG x3 in 2008   Chronic rhinitis 12/27/2019   Last Assessment & Plan:  Concern over his nose. Chronic recurring nasal congestion.  Had a severe episode recently that responded to over-the-counter medication.  Currently asymptomatic.  Wants to make sure all is okay. EXAM intranasally shows no polyps, purulence, masses or obstructing anatomy.  Mucosa is overall healthy. PLAN: Reassured all looks okay.  I think what he is doing is fine.  Happy t   Cognitive communication deficit 10/28/2020   Constipation 08/16/2017   Constipation, unspecified 08/16/2017   Coronary artery disease    status post CABG x3 in 2008   Diabetic polyneuropathy associated with type 2 diabetes mellitus ([place]) 10/04/2016   Disorder of the skin and subcutaneous tissue, unspecified 07/25/2019   Disorders of muscle in diseases classified elsewhere, multiple sites 10/28/2020   Disturbance of  salivary secretion 04/21/2013   Dry mouth, unspecified 10/18/2018   Dyslipidemia    Dysphagia 04/21/2013   Encounter for current long-term use of anticoagulants 05/29/2013   Formatting of this note might be different from the original. IMO routine update Formatting of this note might be different from the original. Overview:  IMO routine update   Encounter for immunization 07/25/2019   Enlarged prostate with lower urinary tract symptoms (LUTS) 05/29/2013   Esophageal reflux 02/26/2014   Essential hypertension 01/19/2019   Fall 04/30/2021   Follow-up visit for aortic valve replacement with metallic  valve 26/94/8546   Foreign body aspiration 03/05/2016   Gastro-esophageal reflux disease without esophagitis 02/26/2014   Generalized edema 10/18/2018   Gout    Gouty arthropathy 05/29/2013   Hidrocystoma of left eyelid 12/10/2014   History of falling 10/28/2020   Hyperlipidemia, unspecified 11/11/2016   Hyperplasia of prostate    benign   Hypersomnia 06/14/2013   Hypertension    Hypertensive heart disease with heart failure ([place]) 10/18/2018   Hypertriglyceridemia 11/11/2016   Hypothyroidism    Hypothyroidism, unspecified 04/10/2016   Impacted cerumen, bilateral 08/16/2017   Impaired fasting glucose 04/21/2013   Iron deficiency anemia, unspecified 08/16/2017   Ischemic optic neuropathy of both eyes 01/28/2016   Localized edema 07/25/2019   Long term (current) use of anticoagulants 05/29/2013   Overview:  IMO routine update IMO routine update   Long term (current) use of aspirin 08/16/2017   Low back pain 08/16/2017   Low-tension glaucoma of both eyes, 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/2016   tmax 12/14 OCT 11/03/2016 VF 12/04/2015 Gonio 05/07/2015 tmax 12/14 OCT 11/03/2016 VF 12/04/2015 Gonio 05/07/2015   Low-tension glaucoma, bilateral, 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/2016   tmax 12/14 OCT 11/03/2016 VF 12/04/2015 Gonio 05/07/2015 tmax 12/14 OCT 11/03/2016 VF 12/04/2015 Gonio 05/07/2015   Lumbar stenosis 04/30/2015   Memory loss, short term 03/30/2016   Mild cognitive impairment 08/30/2016   Mixed dyslipidemia 01/19/2019   Moderate persistent asthma without complication 27/01/5008   Moderate persistent asthma, uncomplicated 38/18/2993   Monitoring for long-term anticoagulant use 08/03/2017   Muscle weakness (generalized) 10/28/2020   Myocardial infarct (HCC)    Nasal congestion 02/04/2018   Neuropathy 04/04/2014   Obstructive sleep apnea 12/17/2013   Obstructive sleep apnea of adult 12/17/2013   Old myocardial infarction 08/22/2019   Oral mucositis (ulcerative), unspecified 10/28/2020    Osteoarthrosis 04/21/2013   Other malaise 07/25/2019   Other specified disorders of nose and nasal sinuses 10/28/2020   Overweight 01/19/2019   Pain in thoracic spine 10/18/2018   Periodic limb movement disorder 04/30/2021   Periodic limb movements of sleep 08/26/2013   Presence of aortocoronary bypass graft 10/18/2018   Presence of prosthetic heart valve 10/18/2018   Pruritus, unspecified 11/10/2020   Pseudophakia of both eyes 12/10/2014   Psoriasis and similar disorder 01/28/2014   Rash and other nonspecific skin eruption 08/16/2017   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/28/2013   Restless legs syndrome 08/26/2013   Spells 08/26/2013   Spinal stenosis, lumbar region without neurogenic claudication 04/30/2015   Status post aortic valve replacement with metallic valve 71/69/6789   Status post shoulder joint replacement    right shoulder   Subtherapeutic anticoagulation 04/10/2016   Syncope and collapse 06/14/2013   Overview:  with seizure like activity. with seizure like activity.   Traumatic subarachnoid hemorrhage without loss of consciousness, subsequent encounter 10/28/2020   Type 2 diabetes mellitus with diabetic neuropathy, unspecified ([place]) 08/16/2017   Unspecified abnormalities of gait and mobility 10/18/2018  Unspecified jaundice 11/10/2020   Unspecified osteoarthritis, unspecified site 04/21/2013   Unspecified urinary incontinence 05/17/2018   Urinary retention 07/18/2018   Vitamin D deficiency, unspecified 08/16/2017    Assessment: 87 y/o M on warfarin PTA for history of mechanical AVR. In the ED from facility with fever. Continuing warfarin. INR yesterday was 3.3. Hgb 12.3. Did have subdural hematoma in 2021.   Addendum 11/22 4:47 AM MD to check head CT before continuing  Goal of Therapy:  INR 0.4-0.4 per note by Adele Barthel NP on 03/23/12 Monitor platelets by anticoagulation protocol: Yes   Plan:  Assess INR with AM labs F/U head CT results   Narda Bonds,  PharmD, BCPS Clinical Pharmacist Phone: [phone]

## 2021-10-06 NOTE — Progress Notes (Signed)
[facility] for Warfarin  Indication: Mechanical AVR  Allergies  Allergen Reactions   Atorvastatin Calcium Other (See Comments)    Developed myopathy.     Dorzolamide Other (See Comments)    Follicula     Brimonidine Other (See Comments)    Red and burning eyes   Brinzolamide-Brimonidine     Redness in eyes - reaction to [place] [Atorvastatin Calcium]     Developed myopathy.   Rotigotine Other (See Comments)    Falling asleep uncontrollably - reaction to Neupro   Tamsulosin Other (See Comments)    Unknown reaction (Flomax)   Vancomycin     Red Rash   Zolpidem Other (See Comments)    Pt. states that he possibly had a neuro reaction.   Dutasteride Rash    Reaction to Avodart    Patient Measurements: Height: 5\' 8"  (172.7 cm) Weight: 99.8 kg (220 lb) IBW/kg (Calculated) : 68.4  Vital Signs: Temp: 97.2 F (36.2 C) (11/22 0500) Temp Source: Axillary (11/22 0500) BP: 123/67 (11/22 0800) Pulse Rate: 73 (11/22 0800)  Labs: Recent Labs    10/05/21 1903 10/05/21 1937 10/05/21 2225 10/06/21 0413 10/06/21 0426  HGB 12.3*  --   --  10.2*  --   HCT 35.7*  --   --  30.4*  --   PLT 162  --   --  130*  --   APTT 44*  --   --   --   --   LABPROT 33.7*  --   --  34.5*  --   INR 3.3*  --   --  3.4*  --   CREATININE 1.21  --   --  1.49*  --   TROPONINIHS  --  146* 550*  --  1,321*     Estimated Creatinine Clearance: 34.7 mL/min (A) (by C-G formula based on SCr of 1.49 mg/dL (H)).   Medical History: Past Medical History:  Diagnosis Date   Acute kidney failure, unspecified ([place]) 07/22/2019   Acute maxillary sinusitis, unspecified 08/16/2017   Allergic rhinitis, unspecified 02/04/2018   Anxiety disorder, unspecified 09/17/2013   Anxiety state 09/17/2013   Aortic stenosis    post aortic valve replacement with St.Jude's valve by Dr.VanTrigt on 07-27-11   Aortic valve disorder 05/29/2013   Atherosclerotic heart disease  of native coronary artery without angina pectoris 08/16/2017   Benign prostatic hyperplasia with lower urinary tract symptoms 08/16/2017   Bradycardia with less than 60 beats per minute 04/29/2021   Candidal stomatitis 08/16/2017   Cellulitis, unspecified 07/25/2019   Chronic airway obstruction ([place]) 04/21/2013   Chronic coronary artery disease 04/10/2016   Formatting of this note might be different from the original. Overview:  status post CABG x3 in 2008 Formatting of this note might be different from the original. Overview:  Overview:  status post CABG x3 in 2008   Chronic rhinitis 12/27/2019   Last Assessment & Plan:  Concern over his nose. Chronic recurring nasal congestion.  Had a severe episode recently that responded to over-the-counter medication.  Currently asymptomatic.  Wants to make sure all is okay. EXAM intranasally shows no polyps, purulence, masses or obstructing anatomy.  Mucosa is overall healthy. PLAN: Reassured all looks okay.  I think what he is doing is fine.  Happy t   Cognitive communication deficit 10/28/2020   Constipation 08/16/2017   Constipation, unspecified 08/16/2017   Coronary artery disease    status post CABG x3 in 2008  Diabetic polyneuropathy associated with type 2 diabetes mellitus ([place]) 10/04/2016   Disorder of the skin and subcutaneous tissue, unspecified 07/25/2019   Disorders of muscle in diseases classified elsewhere, multiple sites 10/28/2020   Disturbance of salivary secretion 04/21/2013   Dry mouth, unspecified 10/18/2018   Dyslipidemia    Dysphagia 04/21/2013   Encounter for current long-term use of anticoagulants 05/29/2013   Formatting of this note might be different from the original. IMO routine update Formatting of this note might be different from the original. Overview:  IMO routine update   Encounter for immunization 07/25/2019   Enlarged prostate with lower urinary tract symptoms (LUTS) 05/29/2013   Esophageal reflux 02/26/2014    Essential hypertension 01/19/2019   Fall 04/30/2021   Follow-up visit for aortic valve replacement with metallic valve 75/64/3329   Foreign body aspiration 03/05/2016   Gastro-esophageal reflux disease without esophagitis 02/26/2014   Generalized edema 10/18/2018   Gout    Gouty arthropathy 05/29/2013   Hidrocystoma of left eyelid 12/10/2014   History of falling 10/28/2020   Hyperlipidemia, unspecified 11/11/2016   Hyperplasia of prostate    benign   Hypersomnia 06/14/2013   Hypertension    Hypertensive heart disease with heart failure ([place]) 10/18/2018   Hypertriglyceridemia 11/11/2016   Hypothyroidism    Hypothyroidism, unspecified 04/10/2016   Impacted cerumen, bilateral 08/16/2017   Impaired fasting glucose 04/21/2013   Iron deficiency anemia, unspecified 08/16/2017   Ischemic optic neuropathy of both eyes 01/28/2016   Localized edema 07/25/2019   Long term (current) use of anticoagulants 05/29/2013   Overview:  IMO routine update IMO routine update   Long term (current) use of aspirin 08/16/2017   Low back pain 08/16/2017   Low-tension glaucoma of both eyes, 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/2016   tmax 12/14 OCT 11/03/2016 VF 12/04/2015 Gonio 05/07/2015 tmax 12/14 OCT 11/03/2016 VF 12/04/2015 Gonio 05/07/2015   Low-tension glaucoma, bilateral, 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/2016   tmax 12/14 OCT 11/03/2016 VF 12/04/2015 Gonio 05/07/2015 tmax 12/14 OCT 11/03/2016 VF 12/04/2015 Gonio 05/07/2015   Lumbar stenosis 04/30/2015   Memory loss, short term 03/30/2016   Mild cognitive impairment 08/30/2016   Mixed dyslipidemia 01/19/2019   Moderate persistent asthma without complication 51/88/4166   Moderate persistent asthma, uncomplicated 05/14/1600   Monitoring for long-term anticoagulant use 08/03/2017   Muscle weakness (generalized) 10/28/2020   Myocardial infarct ([place])    Nasal congestion 02/04/2018   Neuropathy 04/04/2014   Obstructive sleep apnea 12/17/2013   Obstructive sleep apnea of adult  12/17/2013   Old myocardial infarction 08/22/2019   Oral mucositis (ulcerative), unspecified 10/28/2020   Osteoarthrosis 04/21/2013   Other malaise 07/25/2019   Other specified disorders of nose and nasal sinuses 10/28/2020   Overweight 01/19/2019   Pain in thoracic spine 10/18/2018   Periodic limb movement disorder 04/30/2021   Periodic limb movements of sleep 08/26/2013   Presence of aortocoronary bypass graft 10/18/2018   Presence of prosthetic heart valve 10/18/2018   Pruritus, unspecified 11/10/2020   Pseudophakia of both eyes 12/10/2014   Psoriasis and similar disorder 01/28/2014   Rash and other nonspecific skin eruption 08/16/2017   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/28/2013   Restless legs syndrome 08/26/2013   Spells 08/26/2013   Spinal stenosis, lumbar region without neurogenic claudication 04/30/2015   Status post aortic valve replacement with metallic valve 09/32/3557   Status post shoulder joint replacement    right shoulder   Subtherapeutic anticoagulation 04/10/2016   Syncope and collapse 06/14/2013   Overview:  with seizure like activity. with  seizure like activity.   Traumatic subarachnoid hemorrhage without loss of consciousness, subsequent encounter 10/28/2020   Type 2 diabetes mellitus with diabetic neuropathy, unspecified ([place]) 08/16/2017   Unspecified abnormalities of gait and mobility 10/18/2018   Unspecified jaundice 11/10/2020   Unspecified osteoarthritis, unspecified site 04/21/2013   Unspecified urinary incontinence 05/17/2018   Urinary retention 07/18/2018   Vitamin D deficiency, unspecified 08/16/2017    Assessment: 87 y/o M on warfarin PTA for history of mechanical AVR. In the ED from facility with fever. Continuing warfarin. INR yesterday was 3.3. Hgb 12.3. Did have subdural hematoma in 2021.   INR this AM therapeutic at 3.4, PTA dosing appears to have changed to 3mg  twice a week and 5mg  all other days, pending MAR fax from facility to confirm.  Head  CT clear   Goal of Therapy:  INR 8.6-3.8 per note by Adele Barthel NP on 11/21/69 Monitor platelets by anticoagulation protocol: Yes   Plan:  Warfarin 3mg  PO x 1 today Daily INR, s/s bleeding  Bertis Ruddy, PharmD Clinical Pharmacist ED Pharmacist Phone # [phone] 10/06/2021 10:30 AM

## 2021-10-06 NOTE — Progress Notes (Signed)
Pharmacy Antibiotic Note  Peter Beltran is a 87 y.o. male admitted on 10/05/2021 with sepsis.  Pharmacy has been consulted for Cefepime dosing. WBC mildly elevated. Scr 1.21.   Plan: Zyvox/Flagyl per MD Cefepime 2g IV q12h Trend WBC, temp, renal function  F/U infectious work-up   Height: 5\' 8"  (172.7 cm) Weight: 99.8 kg (220 lb) IBW/kg (Calculated) : 68.4  Temp (24hrs), Avg:99.5 F (37.5 C), Min:98.3 F (36.8 C), Max:100.6 F (38.1 C)  Recent Labs  Lab 10/05/21 1903 10/05/21 2103 10/06/21 0152  WBC 11.6*  --   --   CREATININE 1.21  --   --   LATICACIDVEN 3.6* 3.9* 4.4*    Estimated Creatinine Clearance: 42.8 mL/min (by C-G formula based on SCr of 1.21 mg/dL).    Allergies  Allergen Reactions   Atorvastatin Calcium Other (See Comments)    Developed myopathy.     Dorzolamide Other (See Comments)    Follicula     Brimonidine Other (See Comments)    Red and burning eyes   Brinzolamide-Brimonidine     Redness in eyes - reaction to [place] [Atorvastatin Calcium]     Developed myopathy.   Rotigotine Other (See Comments)    Falling asleep uncontrollably - reaction to Neupro   Tamsulosin Other (See Comments)    Unknown reaction (Flomax)   Vancomycin     Red Rash   Zolpidem Other (See Comments)    Pt. states that he possibly had a neuro reaction.   Dutasteride Rash    Reaction to [place], PharmD, [place] Clinical Pharmacist Phone: [phone]

## 2021-10-06 NOTE — ED Notes (Signed)
Peter Beltran daughter [phone] requesting an update

## 2021-10-07 DIAGNOSIS — R748 Abnormal levels of other serum enzymes: Secondary | ICD-10-CM

## 2021-10-07 DIAGNOSIS — I10 Essential (primary) hypertension: Secondary | ICD-10-CM

## 2021-10-07 DIAGNOSIS — R778 Other specified abnormalities of plasma proteins: Secondary | ICD-10-CM

## 2021-10-07 DIAGNOSIS — I2510 Atherosclerotic heart disease of native coronary artery without angina pectoris: Secondary | ICD-10-CM

## 2021-10-07 DIAGNOSIS — R7401 Elevation of levels of liver transaminase levels: Secondary | ICD-10-CM

## 2021-10-07 LAB — COMPREHENSIVE METABOLIC PANEL
ALT: 62 U/L — ABNORMAL HIGH (ref 0–44)
AST: 105 U/L — ABNORMAL HIGH (ref 15–41)
Albumin: 2.9 g/dL — ABNORMAL LOW (ref 3.5–5.0)
Alkaline Phosphatase: 57 U/L (ref 38–126)
Anion gap: 9 (ref 5–15)
BUN: 22 mg/dL (ref 8–23)
CO2: 19 mmol/L — ABNORMAL LOW (ref 22–32)
Calcium: 9 mg/dL (ref 8.9–10.3)
Chloride: 104 mmol/L (ref 98–111)
Creatinine, Ser: 1.11 mg/dL (ref 0.61–1.24)
GFR, Estimated: >60 mL/min (ref >60)
Glucose, Bld: 135 mg/dL — ABNORMAL HIGH (ref 70–99)
Potassium: 3.7 mmol/L (ref 3.5–5.1)
Sodium: 132 mmol/L — ABNORMAL LOW (ref 135–145)
Total Bilirubin: 5 mg/dL — ABNORMAL HIGH (ref 0.3–1.2)
Total Protein: 6.3 g/dL — ABNORMAL LOW (ref 6.5–8.1)

## 2021-10-07 LAB — CBC
HCT: 30.5 % — ABNORMAL LOW (ref 39.0–52.0)
Hemoglobin: 10.3 g/dL — ABNORMAL LOW (ref 13.0–17.0)
MCH: 31.9 pg (ref 26.0–34.0)
MCHC: 33.8 g/dL (ref 30.0–36.0)
MCV: 94.4 fL (ref 80.0–100.0)
Platelets: 117 10*3/uL — ABNORMAL LOW (ref 150–400)
RBC: 3.23 MIL/uL — ABNORMAL LOW (ref 4.22–5.81)
RDW: 14.6 % (ref 11.5–15.5)
WBC: 10.4 10*3/uL (ref 4.0–10.5)
nRBC: 0 % (ref 0.0–0.2)

## 2021-10-07 LAB — T3, FREE: T3, Free: 1.1 pg/mL — ABNORMAL LOW (ref 2.0–4.4)

## 2021-10-07 LAB — PROTIME-INR
INR: 2.9 — ABNORMAL HIGH (ref 0.8–1.2)
Prothrombin Time: 30.6 seconds — ABNORMAL HIGH (ref 11.4–15.2)

## 2021-10-07 LAB — BILIRUBIN, DIRECT: Bilirubin, Direct: 3.2 mg/dL — ABNORMAL HIGH (ref 0.0–0.2)

## 2021-10-07 LAB — PROCALCITONIN: Procalcitonin: 16.88 ng/mL

## 2021-10-07 MED ORDER — ICOSAPENT ETHYL 1 G PO CAPS
2.0000 g | ORAL_CAPSULE | Freq: Two times a day (BID) | ORAL | Status: DC
  Administered 2021-10-07 – 2021-10-08 (×3): 2 g via ORAL
  Filled 2021-10-07 (×4): qty 2

## 2021-10-07 MED ORDER — ASPIRIN EC 81 MG PO TBEC
81.0000 mg | DELAYED_RELEASE_TABLET | Freq: Every day | ORAL | Status: DC
  Administered 2021-10-08: 81 mg via ORAL
  Filled 2021-10-07: qty 1

## 2021-10-07 MED ORDER — WARFARIN SODIUM 5 MG PO TABS
5.0000 mg | ORAL_TABLET | Freq: Once | ORAL | Status: AC
  Administered 2021-10-07: 5 mg via ORAL
  Filled 2021-10-07: qty 1

## 2021-10-07 MED ORDER — SPIRONOLACTONE 25 MG PO TABS
25.0000 mg | ORAL_TABLET | Freq: Every day | ORAL | Status: DC
  Administered 2021-10-07 – 2021-10-08 (×2): 25 mg via ORAL
  Filled 2021-10-07 (×2): qty 1

## 2021-10-07 NOTE — Progress Notes (Signed)
[facility]  Indication: Mechanical AVR  Allergies  Allergen Reactions   Atorvastatin Calcium Other (See Comments)    Developed myopathy.     Dorzolamide Other (See Comments)    Follicula     Brimonidine Other (See Comments)    Red and burning eyes   Brinzolamide-Brimonidine Swelling    Redness in eyes - reaction to [place] [Atorvastatin Calcium] Other (See Comments)    Developed myopathy.   Rotigotine Other (See Comments)    Falling asleep uncontrollably - reaction to Neupro   Tamsulosin Other (See Comments)    Unknown reaction (Flomax)   Zolpidem Other (See Comments)    Pt. states that he possibly had a neuro reaction.   Dutasteride Rash    Reaction to Avodart   Vancomycin Rash    Patient Measurements: Height: 5\' 8"  (172.7 cm) Weight: 99.8 kg (220 lb) IBW/kg (Calculated) : 68.4  Vital Signs: Temp: 98.8 F (37.1 C) (11/23 0528) Temp Source: Oral (11/23 0528) BP: 180/76 (11/23 0528) Pulse Rate: 91 (11/23 0528)  Labs: Recent Labs    10/05/21 1903 10/05/21 1937 10/05/21 2225 10/06/21 0413 10/06/21 0426 10/06/21 2037 10/07/21 0228  HGB 12.3*  --   --  10.2*  --   --  10.3*  HCT 35.7*  --   --  30.4*  --   --  30.5*  PLT 162  --   --  130*  --   --  117*  APTT 44*  --   --   --   --   --   --   LABPROT 33.7*  --   --  34.5*  --   --  30.6*  INR 3.3*  --   --  3.4*  --   --  2.9*  CREATININE 1.21  --   --  1.49*  --   --  1.11  TROPONINIHS  --    < > 550*  --  1,321* 1,245*  --    < > = values in this interval not displayed.     Estimated Creatinine Clearance: 46.6 mL/min (by C-G formula based on SCr of 1.11 mg/dL).   Medical History: Past Medical History:  Diagnosis Date   Acute kidney failure, unspecified ([place]) 07/22/2019   Acute maxillary sinusitis, unspecified 08/16/2017   Allergic rhinitis, unspecified 02/04/2018   Anxiety disorder, unspecified 09/17/2013   Aortic stenosis    post  aortic valve replacement with St.Jude's valve by Dr.VanTrigt on 07-27-11   Aortic valve disorder 05/29/2013   Atherosclerotic heart disease of native coronary artery without angina pectoris 08/16/2017   Benign prostatic hyperplasia with lower urinary tract symptoms 08/16/2017   Bradycardia with less than 60 beats per minute 04/29/2021   Candidal stomatitis 08/16/2017   Cellulitis, unspecified 07/25/2019   Chronic airway obstruction ([place]) 04/21/2013   Chronic coronary artery disease 04/10/2016   Formatting of this note might be different from the original. Overview:  status post CABG x3 in 2008 Formatting of this note might be different from the original. Overview:  Overview:  status post CABG x3 in 2008   Chronic rhinitis 12/27/2019   Last Assessment & Plan:  Concern over his nose. Chronic recurring nasal congestion.  Had a severe episode recently that responded to over-the-counter medication.  Currently asymptomatic.  Wants to make sure all is okay. EXAM intranasally shows no polyps, purulence, masses or obstructing anatomy.  Mucosa is overall healthy. PLAN: Reassured all looks  okay.  I think what he is doing is fine.  Happy t   Cognitive communication deficit 10/28/2020   Constipation, unspecified 08/16/2017   Coronary artery disease    status post CABG x3 in 2008   Diabetic polyneuropathy associated with type 2 diabetes mellitus ([place]) 10/04/2016   Disorder of the skin and subcutaneous tissue, unspecified 07/25/2019   Disorders of muscle in diseases classified elsewhere, multiple sites 10/28/2020   Disturbance of salivary secretion 04/21/2013   Dry mouth, unspecified 10/18/2018   Dyslipidemia    Dysphagia 04/21/2013   Encounter for current long-term use of anticoagulants 05/29/2013   Formatting of this note might be different from the original. IMO routine update Formatting of this note might be different from the original. Overview:  IMO routine update   Enlarged prostate with lower  urinary tract symptoms (LUTS) 05/29/2013   Esophageal reflux 02/26/2014   Essential hypertension 01/19/2019   Fall 04/30/2021   Foreign body aspiration 03/05/2016   Gastro-esophageal reflux disease without esophagitis 02/26/2014   Generalized edema 10/18/2018   Gout    Hidrocystoma of left eyelid 12/10/2014   History of falling 10/28/2020   Hyperlipidemia, unspecified 11/11/2016   Hyperplasia of prostate    benign   Hypersomnia 06/14/2013   Hypertension    Hypertensive heart disease with heart failure ([place]) 10/18/2018   Hypertriglyceridemia 11/11/2016   Hypothyroidism    Hypothyroidism, unspecified 04/10/2016   Impaired fasting glucose 04/21/2013   Iron deficiency anemia, unspecified 08/16/2017   Ischemic optic neuropathy of both eyes 01/28/2016   Localized edema 07/25/2019   Long term (current) use of anticoagulants 05/29/2013   Overview:  IMO routine update IMO routine update   Long term (current) use of aspirin 08/16/2017   Low back pain 08/16/2017   Low-tension glaucoma of both eyes, 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/2016   tmax 12/14 OCT 11/03/2016 VF 12/04/2015 Gonio 05/07/2015 tmax 12/14 OCT 11/03/2016 VF 12/04/2015 Gonio 05/07/2015   Lumbar stenosis 04/30/2015   Memory loss, short term 03/30/2016   Mild cognitive impairment 08/30/2016   Mixed dyslipidemia 01/19/2019   Moderate persistent asthma without complication 47/82/9562   Moderate persistent asthma, uncomplicated 13/06/6577   Monitoring for long-term anticoagulant use 08/03/2017   Muscle weakness (generalized) 10/28/2020   Myocardial infarct (HCC)    Nasal congestion 02/04/2018   Neuropathy 04/04/2014   Obstructive sleep apnea 12/17/2013   Obstructive sleep apnea of adult 12/17/2013   Old myocardial infarction 08/22/2019   Oral mucositis (ulcerative), unspecified 10/28/2020   Osteoarthrosis 04/21/2013   Other malaise 07/25/2019   Other specified disorders of nose and nasal sinuses 10/28/2020   Overweight 01/19/2019    Pain in thoracic spine 10/18/2018   Periodic limb movement disorder 04/30/2021   Pruritus, unspecified 11/10/2020   Pseudophakia of both eyes 12/10/2014   Psoriasis and similar disorder 01/28/2014   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/28/2013   Restless legs syndrome 08/26/2013   Spinal stenosis, lumbar region without neurogenic claudication 04/30/2015   Status post aortic valve replacement with metallic valve 46/96/2952   Status post shoulder joint replacement    right shoulder   Subtherapeutic anticoagulation 04/10/2016   Syncope and collapse 06/14/2013   Overview:  with seizure like activity. with seizure like activity.   Traumatic subarachnoid hemorrhage without loss of consciousness, subsequent encounter 10/28/2020   Type 2 diabetes mellitus with diabetic neuropathy, unspecified ([place]) 08/16/2017   Unspecified abnormalities of gait and mobility 10/18/2018   Unspecified jaundice 11/10/2020   Unspecified osteoarthritis, unspecified site 04/21/2013   Unspecified urinary  incontinence 05/17/2018   Urinary retention 07/18/2018   Vitamin D deficiency, unspecified 08/16/2017    Assessment: 87 y/o M on warfarin PTA for history of mechanical AVR. In the ED from facility with fever. Continuing warfarin. INR yesterday was 3.4. Hgb 10.2. Did have subdural hematoma in 2021.   INR this AM therapeutic at 2.9, PTA dosing appears to have changed to 3mg  three day a week (W, Sa, Su) and 5mg  all other days (M, Tu, Th, Fr), per [facility].  Head CT clear   Goal of Therapy:  INR 9.3-9.0 per note by Adele Barthel NP on 3/0/09 Monitor platelets by anticoagulation protocol: Yes   Plan:  Warfarin 5 mg PO x 1 today Daily INR, s/s bleeding  Daleyssa Loiselle A. Levada Dy, PharmD, BCPS, FNKF Clinical Pharmacist [place] Please utilize Amion for appropriate phone number to reach the unit pharmacist ([place])  10/07/2021 7:55 AM

## 2021-10-07 NOTE — Consult Note (Signed)
[facility] for Infectious Disease    Date of Admission:  10/05/2021   Total days of inpatient antibiotics 2        Reason for Consult: Ecoli bacteremia   Principal Problem:   Severe sepsis [facility]) Active Problems:   Coronary artery disease   Lab test positive for detection of COVID-19 virus   Elevated troponin   Vomiting and diarrhea   Assessment: 89 YM with DM wit A1c 6.0, mechanical aortic valve in 2012, COVID infection 2 weeks ago treated with Paxlovid x5 days presented with fever, nausea, vomiting diarrhea. Admitted for sepsis in the setting of COVID infection. ID consulted for Ecoli bacteremia in the setting of aortic valve endocarditis.   #E coli bacteremia #Mechanical aortic valve #DM -Cardiology consulted per elevated troponin and recommended TTE -Ecoli is a rare cause of prosthetic valve endocarditis. Given that TTE did not show signs of vegetation, would not pursue further.  Recommendations:  -Continue Ceftriaxone, treat with 2 weeks of antibiotics from 11/21(can de-escalate to PO pending sensitivities) -Follow blood Cx -ID will sign off Microbiology:   Antibiotics: Azithromycin 11/21 Cefepime 11/22 Ceftriaxone 11/21-p Linezolid 11/21 Metronidazole 11/21  Cultures: 11/21 Blood Cx 1/2 Ecoli, 1/2 GBR 11/21 Urine Cx No growth  HPI: Peter Beltran is a 87 y.o. male DM (A1c 6 on 10/06/21), CAD SP CABG, CKD stage II-III, mechanical aortic valve o n warfarin, mild cognitive impairment, RLS, BPH, GERD, HTN, hypothyroidism, OSA admiited for sever sepsis in the setting of COVID. He presented to the ED with fever, chills, malaise, decreased PO intake. He had been diagnosed with COVID 2 weeks prior and treated with Paxlovid x 5 days. On arrival, wbc 15K, afebrile. CT chest abdomen pelvis was unrevealing. Cardiology consulted per elevated troponin and recommended TTE. Blood Cx + Ecoli. GI consulted per elevated LFTs, which trended down->no further evaluation  recommended. ID consulted for Ecoli bacteremia in the setting of mechanical aortic valve.  Today, pt is resting in bed. He denies cough, SHOB, fever, N/VD.   Review of Systems: Review of Systems  All other systems reviewed and are negative.  Past Medical History:  Diagnosis Date   Acute kidney failure, unspecified ([place]) 07/22/2019   Acute maxillary sinusitis, unspecified 08/16/2017   Allergic rhinitis, unspecified 02/04/2018   Anxiety disorder, unspecified 09/17/2013   Aortic stenosis    post aortic valve replacement with St.Jude's valve by Dr.VanTrigt on 07-27-11   Aortic valve disorder 05/29/2013   Atherosclerotic heart disease of native coronary artery without angina pectoris 08/16/2017   Benign prostatic hyperplasia with lower urinary tract symptoms 08/16/2017   Bradycardia with less than 60 beats per minute 04/29/2021   Candidal stomatitis 08/16/2017   Cellulitis, unspecified 07/25/2019   Chronic airway obstruction ([place]) 04/21/2013   Chronic coronary artery disease 04/10/2016   Formatting of this note might be different from the original. Overview:  status post CABG x3 in 2008 Formatting of this note might be different from the original. Overview:  Overview:  status post CABG x3 in 2008   Chronic rhinitis 12/27/2019   Last Assessment & Plan:  Concern over his nose. Chronic recurring nasal congestion.  Had a severe episode recently that responded to over-the-counter medication.  Currently asymptomatic.  Wants to make sure all is okay. EXAM intranasally shows no polyps, purulence, masses or obstructing anatomy.  Mucosa is overall healthy. PLAN: Reassured all looks okay.  I think what he is doing is fine.  Happy t  Cognitive communication deficit 10/28/2020   Constipation, unspecified 08/16/2017   Coronary artery disease    status post CABG x3 in 2008   Diabetic polyneuropathy associated with type 2 diabetes mellitus ([place]) 10/04/2016   Disorder of the skin and subcutaneous tissue,  unspecified 07/25/2019   Disorders of muscle in diseases classified elsewhere, multiple sites 10/28/2020   Disturbance of salivary secretion 04/21/2013   Dry mouth, unspecified 10/18/2018   Dyslipidemia    Dysphagia 04/21/2013   Encounter for current long-term use of anticoagulants 05/29/2013   Formatting of this note might be different from the original. IMO routine update Formatting of this note might be different from the original. Overview:  IMO routine update   Enlarged prostate with lower urinary tract symptoms (LUTS) 05/29/2013   Esophageal reflux 02/26/2014   Essential hypertension 01/19/2019   Fall 04/30/2021   Foreign body aspiration 03/05/2016   Gastro-esophageal reflux disease without esophagitis 02/26/2014   Generalized edema 10/18/2018   Gout    Hidrocystoma of left eyelid 12/10/2014   History of falling 10/28/2020   Hyperlipidemia, unspecified 11/11/2016   Hyperplasia of prostate    benign   Hypersomnia 06/14/2013   Hypertension    Hypertensive heart disease with heart failure ([place]) 10/18/2018   Hypertriglyceridemia 11/11/2016   Hypothyroidism    Hypothyroidism, unspecified 04/10/2016   Impaired fasting glucose 04/21/2013   Iron deficiency anemia, unspecified 08/16/2017   Ischemic optic neuropathy of both eyes 01/28/2016   Localized edema 07/25/2019   Long term (current) use of anticoagulants 05/29/2013   Overview:  IMO routine update IMO routine update   Long term (current) use of aspirin 08/16/2017   Low back pain 08/16/2017   Low-tension glaucoma of both eyes, 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/2016   tmax 12/14 OCT 11/03/2016 VF 12/04/2015 Gonio 05/07/2015 tmax 12/14 OCT 11/03/2016 VF 12/04/2015 Gonio 05/07/2015   Lumbar stenosis 04/30/2015   Memory loss, short term 03/30/2016   Mild cognitive impairment 08/30/2016   Mixed dyslipidemia 01/19/2019   Moderate persistent asthma without complication 16/08/9603   Moderate persistent asthma, uncomplicated 54/07/8118    Monitoring for long-term anticoagulant use 08/03/2017   Muscle weakness (generalized) 10/28/2020   Myocardial infarct (HCC)    Nasal congestion 02/04/2018   Neuropathy 04/04/2014   Obstructive sleep apnea 12/17/2013   Obstructive sleep apnea of adult 12/17/2013   Old myocardial infarction 08/22/2019   Oral mucositis (ulcerative), unspecified 10/28/2020   Osteoarthrosis 04/21/2013   Other malaise 07/25/2019   Other specified disorders of nose and nasal sinuses 10/28/2020   Overweight 01/19/2019   Pain in thoracic spine 10/18/2018   Periodic limb movement disorder 04/30/2021   Pruritus, unspecified 11/10/2020   Pseudophakia of both eyes 12/10/2014   Psoriasis and similar disorder 01/28/2014   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/28/2013   Restless legs syndrome 08/26/2013   Spinal stenosis, lumbar region without neurogenic claudication 04/30/2015   Status post aortic valve replacement with metallic valve 14/78/2956   Status post shoulder joint replacement    right shoulder   Subtherapeutic anticoagulation 04/10/2016   Syncope and collapse 06/14/2013   Overview:  with seizure like activity. with seizure like activity.   Traumatic subarachnoid hemorrhage without loss of consciousness, subsequent encounter 10/28/2020   Type 2 diabetes mellitus with diabetic neuropathy, unspecified ([place]) 08/16/2017   Unspecified abnormalities of gait and mobility 10/18/2018   Unspecified jaundice 11/10/2020   Unspecified osteoarthritis, unspecified site 04/21/2013   Unspecified urinary incontinence 05/17/2018   Urinary retention 07/18/2018   Vitamin D deficiency, unspecified 08/16/2017  Social History   Tobacco Use   Smoking status: Former   Smokeless tobacco: Never  Substance Use Topics   Alcohol use: No   Drug use: No    Family History  Problem Relation Age of Onset   Hypertension Neg Hx    Diabetes Neg Hx    Cancer Neg Hx    Heart disease Neg Hx    Scheduled Meds:  [START ON  10/08/2021] aspirin EC  81 mg Oral Daily   icosapent Ethyl  2 g Oral BID   spironolactone  25 mg Oral Daily   Warfarin - Pharmacist Dosing Inpatient   Does not apply q1600   Continuous Infusions:  cefTRIAXone (ROCEPHIN)  IV 2 g (10/07/21 2246)   PRN Meds:.acetaminophen **OR** acetaminophen Allergies  Allergen Reactions   Atorvastatin Calcium Other (See Comments)    Developed myopathy.     Dorzolamide Other (See Comments)    Follicula     Brimonidine Other (See Comments)    Red and burning eyes   Brinzolamide-Brimonidine Swelling    Redness in eyes - reaction to [place] [Atorvastatin Calcium] Other (See Comments)    Developed myopathy.   Rotigotine Other (See Comments)    Falling asleep uncontrollably - reaction to Neupro   Tamsulosin Other (See Comments)    Unknown reaction (Flomax)   Zolpidem Other (See Comments)    Pt. states that he possibly had a neuro reaction.   Dutasteride Rash    Reaction to Avodart   Vancomycin Rash    OBJECTIVE: Blood pressure (!) 179/83, pulse 92, temperature 98.8 F (37.1 C), temperature source Oral, resp. rate 18, height 5\' 8"  (1.727 m), weight 99.8 kg, SpO2 94 %.  Physical Exam Constitutional:      General: He is not in acute distress.    Appearance: He is normal weight. He is not toxic-appearing.  HENT:     Head: Normocephalic and atraumatic.     Right Ear: External ear normal.     Left Ear: External ear normal.     Nose: No congestion or rhinorrhea.     Mouth/Throat:     Mouth: Mucous membranes are moist.     Pharynx: Oropharynx is clear.  Eyes:     Extraocular Movements: Extraocular movements intact.     Conjunctiva/sclera: Conjunctivae normal.     Pupils: Pupils are equal, round, and reactive to light.  Cardiovascular:     Rate and Rhythm: Normal rate and regular rhythm.     Heart sounds: No murmur heard.   No friction rub. No gallop.  Pulmonary:     Effort: Pulmonary effort is normal.     Breath sounds:  Normal breath sounds.  Abdominal:     General: Abdomen is flat. Bowel sounds are normal.     Palpations: Abdomen is soft.  Musculoskeletal:        General: No swelling. Normal range of motion.     Cervical back: Normal range of motion and neck supple.  Skin:    General: Skin is warm and dry.  Neurological:     Mental Status: He is oriented to person, place, and time.  Psychiatric:        Mood and Affect: Mood normal.    Lab Results Lab Results  Component Value Date   WBC 10.4 10/07/2021   HGB 10.3 (L) 10/07/2021   HCT 30.5 (L) 10/07/2021   MCV 94.4 10/07/2021   PLT 117 (L) 10/07/2021    Lab Results  Component  Value Date   CREATININE 1.11 10/07/2021   BUN 22 10/07/2021   NA 132 (L) 10/07/2021   K 3.7 10/07/2021   CL 104 10/07/2021   CO2 19 (L) 10/07/2021    Lab Results  Component Value Date   ALT 62 (H) 10/07/2021   AST 105 (H) 10/07/2021   ALKPHOS 57 10/07/2021   BILITOT 5.0 (H) 10/07/2021       Laurice Record, [facility] Infectious [facility] 10/07/2021, 11:48 PM

## 2021-10-07 NOTE — Progress Notes (Signed)
Progress Note  Patient Name: Peter Beltran Date of Encounter: 10/07/2021  [facility] Cardiologist: Skeet Latch, MD new  Subjective   Feeling fine.  No chest pain.  No shortness of breath.  Inpatient Medications    Scheduled Meds:  aspirin  325 mg Oral Daily   warfarin  5 mg Oral ONCE-1600   Warfarin - Pharmacist Dosing Inpatient   Does not apply q1600   Continuous Infusions:  cefTRIAXone (ROCEPHIN)  IV 2 g (10/06/21 2200)   PRN Meds: acetaminophen **OR** acetaminophen   Vital Signs    Vitals:   10/06/21 1812 10/06/21 2032 10/07/21 0528 10/07/21 0824  BP: (!) 176/77 (!) 150/61 (!) 180/76 (!) 185/80  Pulse: 91 87 91 95  Resp: 16     Temp: 99.4 F (37.4 C) 99.3 F (37.4 C) 98.8 F (37.1 C) 98.3 F (36.8 C)  TempSrc: Oral Oral Oral Oral  SpO2: 96% 99% 94% 97%  Weight:      Height:        Intake/Output Summary (Last 24 hours) at 10/07/2021 1022 Last data filed at 10/07/2021 0756 Gross per 24 hour  Intake 240 ml  Output 1500 ml  Net -1260 ml   Last 3 Weights 10/05/2021 06/08/2021 05/04/2021  Weight (lbs) 220 lb 206 lb 1.3 oz 220 lb  Weight (kg) 99.791 kg 93.477 kg 99.791 kg      Telemetry    SR - Personally Reviewed  ECG    Sinus tachycardia.  Rate 103 bpm. LVH.  RBBB.  Personally Reviewed  Physical Exam   GEN: No acute distress.   Neck: No JVD Cardiac: RRR, II/VI systolic murmur at LUSB,  no rubs, or gallops.  Respiratory: Clear to auscultation bilaterally. GI: Soft, nontender, non-distended  MS: No edema; No deformity. Neuro:  Nonfocal  Psych: Normal affect   Labs    High Sensitivity Troponin:   Recent Labs  Lab 10/05/21 1937 10/05/21 2225 10/06/21 0426 10/06/21 2037  TROPONINIHS 146* 550* 1,321* 1,245*     Chemistry Recent Labs  Lab 10/05/21 1903 10/05/21 2225 10/06/21 0413 10/07/21 0228  NA 135  --  133* 132*  K 3.1*  --  4.2 3.7  CL 99  --  101 104  CO2 23  --  21* 19*  GLUCOSE 143*  --  168* 135*  BUN 21   --  25* 22  CREATININE 1.21  --  1.49* 1.11  CALCIUM 9.6  --  8.8* 9.0  MG  --  1.4* 1.9  --   PROT 6.8  --  6.0* 6.3*  ALBUMIN 3.4*  --  2.8* 2.9*  AST 146*  --  124* 105*  ALT 70*  --  65* 62*  ALKPHOS 47  --  40 57  BILITOT 4.3*  --  4.6* 5.0*  GFRNONAA 55*  --  43* >60  ANIONGAP 13  --  11 9    Lipids No results for input(s): CHOL, TRIG, HDL, LABVLDL, LDLCALC, CHOLHDL in the last 168 hours.  Hematology Recent Labs  Lab 10/05/21 1903 10/06/21 0413 10/07/21 0228  WBC 11.6* 15.2* 10.4  RBC 3.76* 3.17* 3.23*  HGB 12.3* 10.2* 10.3*  HCT 35.7* 30.4* 30.5*  MCV 94.9 95.9 94.4  MCH 32.7 32.2 31.9  MCHC 34.5 33.6 33.8  RDW 14.3 14.6 14.6  PLT 162 130* 117*   Thyroid  Recent Labs  Lab 10/06/21 0450 10/06/21 0809  TSH 0.247*  --   FREET4  --  1.38*  BNP Recent Labs  Lab 10/05/21 2224  BNP 427.9*    DDimer  Recent Labs  Lab 10/05/21 2225  DDIMER 0.88*    Lab Results  Component Value Date   FERRITIN 1,026 (H) 10/05/2021     Radiology    CT HEAD WO CONTRAST (5MM)  Result Date: 10/06/2021 CLINICAL DATA:  87 year old male with history of delirium. EXAM: CT HEAD WITHOUT CONTRAST TECHNIQUE: Contiguous axial images were obtained from the base of the skull through the vertex without intravenous contrast. COMPARISON:  Head CT 05/04/2021. FINDINGS: Brain: Moderate cerebral atrophy. Patchy and confluent areas of decreased attenuation are noted throughout the deep and periventricular white matter of the cerebral hemispheres bilaterally, compatible with chronic microvascular ischemic disease. More well-defined area of low attenuation in the left thalamus, similar to the prior study, compatible with an old lacunar infarct. Additional small well-defined focus of low attenuation in the superior aspect of the left cerebellar hemisphere also compatible with an old lacunar infarct. No evidence of acute infarction, hemorrhage, hydrocephalus, extra-axial collection or mass  lesion/mass effect. Vascular: Multiple vascular calcifications noted in the vertebrobasilar and carotid systems. Skull: Normal. Negative for fracture or focal lesion. Sinuses/Orbits: Multifocal mucosal thickening in the paranasal sinuses bilaterally, with complete opacification of the right frontal sinus, frontoethmoidal recess, and many of the anterior right ethmoid cells. Widespread mucosal thickening is noted in the other ethmoid cells, maxillary sinuses and sphenoid sinuses bilaterally. Small air-fluid level noted in the left maxillary sinus. Other: None. IMPRESSION: 1. No acute intracranial abnormalities. 2. 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 3. Extensive chronic paranasal sinus disease. Additionally, there is a small air-fluid level in the left maxillary sinus. Clinical correlation for signs and symptoms of acute sinusitis is recommended. Electronically Signed   By: Vinnie Langton M.D.   On: 10/06/2021 06:58   DG Chest Port 1 View  Result Date: 10/05/2021 CLINICAL DATA:  Questionable sepsis. EXAM: PORTABLE CHEST 1 VIEW COMPARISON:  Chest radiograph dated 05/04/2021. FINDINGS: Diffuse chronic interstitial coarsening. There is cardiomegaly with central vascular congestion. No focal consolidation, pleural effusion or pneumothorax. Median sternotomy wires and CABG vascular clips. Atherosclerotic calcification of the aorta. No acute osseous pathology degenerative changes spine. Partially visualized right shoulder arthroplasty. IMPRESSION: Cardiomegaly with central vascular congestion. No focal consolidation. Electronically Signed   By: Anner Crete M.D.   On: 10/05/2021 20:50   ECHOCARDIOGRAM COMPLETE  Result Date: 10/06/2021    ECHOCARDIOGRAM REPORT   Patient Name:   Peter Beltran Date of Exam: 10/06/2021  Medical Rec #:  [id number]           Height:       68.0 in Accession #:    [id number]          Weight:       220.0 lb Date of Birth:  05/23/1925            BSA:          2.128 m Patient Age:    87 years            BP:           114/48 mmHg Patient Gender: M                   HR:           74 bpm. Exam Location:  Inpatient Procedure: 2D Echo, Cardiac Doppler, Color Doppler and Intracardiac            Opacification Agent Indications:    Elevated troponin  History:        Patient has prior history of Echocardiogram examinations, most                 recent 01/05/2021. CAD, Signs/Symptoms:Syncope; Risk                 Factors:Dyslipidemia.  Sonographer:    Glo Herring Referring Phys: [id number] [place]  1. Left ventricular ejection fraction, by estimation, is 60 to 65%. The left ventricle has normal function. The left ventricle has no regional wall motion abnormalities. There is mild left ventricular hypertrophy. Left ventricular diastolic parameters are consistent with Grade II diastolic dysfunction (pseudonormalization).  2. Right ventricular systolic function is mildly reduced. The right ventricular size is mildly enlarged.  3. Left atrial size was mild to moderately dilated.  4. Right atrial size was moderately dilated.  5. Moderate mitral valve regurgitation, ERO 0.3 cm^2. Mild to moderate mitral stenosis. The mean mitral valve gradient is 6.0 mmHg with MVA 1.68 cm^2 by VTI. Moderate mitral annular calcification.  6. Mechanical aortic valve. Mean gradient 16 mmHg with EOA 1.5 cm^2, DI 0.44. No aortic insufficiency noted.  7. Aortic dilatation noted. There is mild dilatation of the ascending aorta, measuring 38 mm.  8. The inferior vena cava is dilated in size with <50% respiratory variability, suggesting right atrial pressure of 15 mmHg. FINDINGS  Left Ventricle: Left ventricular ejection fraction, by estimation, is 60 to 65%. The left ventricle has normal function. The left ventricle has no  regional wall motion abnormalities. The left ventricular internal cavity size was normal in size. There is  mild left ventricular hypertrophy. Left ventricular diastolic parameters are consistent with Grade II diastolic dysfunction (pseudonormalization). Right Ventricle: The right ventricular size is mildly enlarged. No increase in right ventricular wall thickness. Right ventricular systolic function is mildly reduced. Left Atrium: Left atrial size was mild to moderately dilated. Right Atrium: Right atrial size was moderately dilated. Pericardium: There is no evidence of pericardial effusion. Mitral Valve: The mitral valve is degenerative in appearance. There is moderate calcification of the mitral valve leaflet(s). Moderate mitral annular calcification. Moderate mitral valve regurgitation. Mild to moderate mitral valve stenosis. MV peak gradient, 11.7 mmHg. The mean mitral valve gradient is 6.0 mmHg. Tricuspid Valve: The tricuspid valve is normal in structure. Tricuspid valve regurgitation is mild. Aortic Valve: Mechanical aortic valve. Mean gradient 16 mmHg with EOA 1.5 cm^2, DI 0.44. No aortic insufficiency noted. The aortic valve was not well visualized. Aortic valve regurgitation is not visualized. Aortic valve mean gradient measures 15.0 mmHg.  Aortic valve peak gradient measures 27.7 mmHg. Aortic valve area, by VTI measures 1.41 cm. Pulmonic Valve: The pulmonic valve was normal in structure. Pulmonic valve regurgitation is trivial. Aorta: The aortic root is normal in size and structure and aortic dilatation noted. There is mild dilatation of the ascending aorta, measuring 38 mm. Venous: The inferior vena cava is dilated in size with less than 50% respiratory variability, suggesting right atrial pressure of 15 mmHg. IAS/Shunts: No atrial level shunt detected by color flow Doppler.  LEFT VENTRICLE PLAX 2D LVIDd:         5.70 cm   Diastology LVIDs:         3.90 cm   LV e' medial:    3.48 cm/s LV PW:  1.40 cm   LV E/e' medial:  52.6 LV IVS:        1.40 cm   LV e' lateral:   3.92 cm/s LVOT diam:     2.00 cm   LV E/e' lateral: 46.7 LV SV:         76 LV SV Index:   36 LVOT Area:     3.14 cm  RIGHT VENTRICLE            IVC RV Basal diam:  4.00 cm    IVC diam: 2.50 cm RV S prime:     6.64 cm/s LEFT ATRIUM             Index        RIGHT ATRIUM           Index LA diam:        5.60 cm 2.63 cm/m   RA Area:     26.20 cm LA Vol (A2C):   83.8 ml 39.38 ml/m  RA Volume:   79.60 ml  37.40 ml/m LA Vol (A4C):   85.0 ml 39.94 ml/m LA Biplane Vol: 84.4 ml 39.66 ml/m  AORTIC VALVE                     PULMONIC VALVE AV Area (Vmax):    1.52 cm      PV Vmax:       0.91 m/s AV Area (Vmean):   1.42 cm      PV Peak grad:  3.3 mmHg AV Area (VTI):     1.41 cm AV Vmax:           263.33 cm/s AV Vmean:          184.333 cm/s AV VTI:            0.541 m AV Peak Grad:      27.7 mmHg AV Mean Grad:      15.0 mmHg LVOT Vmax:         127.50 cm/s LVOT Vmean:        83.350 cm/s LVOT VTI:          0.242 m LVOT/AV VTI ratio: 0.45  AORTA Ao Root diam: 3.30 cm Ao Asc diam:  3.80 cm MITRAL VALVE                  TRICUSPID VALVE MV Area (PHT): 3.45 cm       TR Peak grad:   33.4 mmHg MV Area VTI:   1.62 cm       TR Vmax:        289.00 cm/s MV Peak grad:  11.7 mmHg MV Mean grad:  6.0 mmHg       SHUNTS MV Vmax:       1.71 m/s       Systemic VTI:  0.24 m MV Vmean:      113.3 cm/s     Systemic Diam: 2.00 cm MV Decel Time: 220 msec MR Peak grad:    92.5 mmHg MR Mean grad:    63.0 mmHg MR Vmax:         481.00 cm/s MR Vmean:        382.0 cm/s MR PISA:         4.02 cm MR PISA Eff ROA: 30 mm MR PISA Radius:  0.80 cm MV E velocity: 183.00 cm/s MV A velocity: 139.00 cm/s MV E/A ratio:  1.32 Dalton McleanMD Electronically signed by Franki Monte Signature Date/Time:  10/06/2021/12:22:54 PM    Final    CT Angio Chest/Abd/Pel for Dissection W and/or Wo Contrast  Result Date: 10/05/2021 CLINICAL DATA:  Nausea and vomiting, evaluate for possible mesenteric  ischemia. EXAM: CT ANGIOGRAPHY CHEST, ABDOMEN AND PELVIS TECHNIQUE: Non-contrast CT of the chest was initially obtained. Multidetector CT imaging through the chest, abdomen and pelvis was performed using the standard protocol during bolus administration of intravenous contrast. Multiplanar reconstructed images and MIPs were obtained and reviewed to evaluate the vascular anatomy. CONTRAST:  137mL OMNIPAQUE IOHEXOL 350 MG/ML SOLN COMPARISON:  None. FINDINGS: CTA CHEST FINDINGS Cardiovascular: Initial precontrast images demonstrate atherosclerotic calcification as well as findings of prior coronary bypass grafting. No aneurysmal dilatation is seen. No hyperdense crescent to suggest aortic injury is noted. Post-contrast images show no evidence of dissection. The pulmonary artery shows a normal branching pattern. No evidence of pulmonary emboli are seen. Coronary calcifications and changes of prior coronary bypass are noted. No cardiac enlargement is seen. Aortic valve replacement is noted. No pericardial effusion is noted. Mediastinum/Nodes: Thoracic inlet is within normal limits. No sizable hilar or mediastinal adenopathy is noted. The esophagus as visualized is within normal limits. Lungs/Pleura: Lungs are well aerated bilaterally. No focal infiltrate or sizable effusion is seen. No sizable parenchymal nodules are noted. Mild scarring is noted in the lower lobes bilaterally. Musculoskeletal: Degenerative changes of the thoracic spine are noted. No rib fractures are seen. No compression deformities are noted. Review of the MIP images confirms the above findings. CTA ABDOMEN AND PELVIS FINDINGS VASCULAR Aorta: Abdominal aorta demonstrates diffuse atherosclerotic calcifications. No aneurysmal dilatation or dissection is noted. Celiac: Atherosclerotic calcifications of the origin of the celiac axis are seen although no focal stenosis is noted. SMA: Atherosclerotic calcification is noted without focal stenosis. Renals:  Dual renal arteries are noted on the right with single renal artery on the left. No stenotic changes are seen. IMA: Patent without evidence of aneurysm, dissection, vasculitis or significant stenosis. Inflow: Iliac arteries demonstrate atherosclerotic calcification without aneurysmal dilatation or dissection. Veins: No specific venous abnormality is noted. Review of the MIP images confirms the above findings. NON-VASCULAR Hepatobiliary: No focal liver abnormality is seen. No gallstones, gallbladder wall thickening, or biliary dilatation. Pancreas: Unremarkable. No pancreatic ductal dilatation or surrounding inflammatory changes. Spleen: Normal in size without focal abnormality. Adrenals/Urinary Tract: Adrenal glands are within normal limits bilaterally. Normal enhancement is noted in the kidneys bilaterally. No renal calculi or obstructive changes are seen. A tiny cyst is noted within the left kidney measuring less than 1 cm. No obstructive changes are seen. The ureters are unremarkable. The bladder is well distended. Stomach/Bowel: Scattered diverticular change of the colon is noted without evidence of diverticulitis. No obstructive changes of the colon are noted. The appendix is not well visualized. No inflammatory changes to suggest appendicitis are noted. The small bowel and stomach are unremarkable. Lymphatic: No sizable lymphadenopathy is noted. Reproductive: Prostate is diminutive likely related to the prior ablation Other: No abdominal wall hernia or abnormality. No abdominopelvic ascites. Musculoskeletal: Degenerative changes of lumbar spine are noted. No acute bony abnormality is seen. Degenerative changes of the sacroiliac joints are noted as well. Review of the MIP images confirms the above findings. IMPRESSION: No evidence of mesenteric ischemia. Diverticulosis without diverticulitis. No acute abnormality noted. Aortic Atherosclerosis (ICD10-I70.0). Electronically Signed   By: Inez Catalina M.D.   On:  10/05/2021 22:23   US Abdomen Limited RUQ (LIVER/GB)  Result Date: 10/06/2021 CLINICAL DATA:  Elevated LFTs. EXAM: ULTRASOUND ABDOMEN  LIMITED RIGHT UPPER QUADRANT COMPARISON:  10/05/2021. FINDINGS: Gallbladder: Sludge is noted in the gallbladder. No gallstones or wall thickening visualized. No sonographic Murphy sign noted by sonographer. Common bile duct: Diameter: 6.3 mm, within normal limits for patient's stated age. Liver: No focal lesion identified. Within normal limits in parenchymal echogenicity. Portal vein is patent on color Doppler imaging with normal direction of blood flow towards the liver. Other: No free fluid. IMPRESSION: Gallbladder sludge without evidence of acute cholecystitis. Electronically Signed   By: Brett Fairy M.D.   On: 10/06/2021 04:48    Cardiac Studies   Echo 10/06/21:  1. Left ventricular ejection fraction, by estimation, is 60 to 65%. The  left ventricle has normal function. The left ventricle has no regional  wall motion abnormalities. There is mild left ventricular hypertrophy.  Left ventricular diastolic parameters  are consistent with Grade II diastolic dysfunction (pseudonormalization).   2. Right ventricular systolic function is mildly reduced. The right  ventricular size is mildly enlarged.   3. Left atrial size was mild to moderately dilated.   4. Right atrial size was moderately dilated.   5. Moderate mitral valve regurgitation, ERO 0.3 cm^2. Mild to moderate  mitral stenosis. The mean mitral valve gradient is 6.0 mmHg with MVA 1.68  cm^2 by VTI. Moderate mitral annular calcification.   6. Mechanical aortic valve. Mean gradient 16 mmHg with EOA 1.5 cm^2, DI  0.44. No aortic insufficiency noted.   7. Aortic dilatation noted. There is mild dilatation of the ascending  aorta, measuring 38 mm.   8. The inferior vena cava is dilated in size with <50% respiratory  variability, suggesting right atrial pressure of 15 mmHg.   Patient Profile     Mr.  Jedlicka is a 87 year old man with a mechanical AVR in 2012, CAD status post CABG, diabetes, hyperlipidemia, dementia, and hypertension admitted with sepsis in the setting of recent COVID-19 infection.    Assessment & Plan    #Elevated troponin: High-sensitivity troponin peaked at 1321.  He has not had any chest pain and his echo reveals normal systolic function with no wall motion abnormalities.  Given this and his age, no plans for any ischemic evaluation at this time.  Likely demand ischemia in the setting of E. coli bacteremia.  Recommend reducing aspirin to 81 mg.  #CAD: #Hyperlipidemia: Prior CABG and PCI.  He had NSTEMI and a LHC 07/2019 at [facility] and had an occluded SVG-->PDA.  This was stented with resolution of symptoms (SOB).  Reduce aspirin to 81 mg as above.  At home he is on fenofibrate and Vascepa.  Fenofibrate currently on hold due to concern it may be causing his elevated LFTs.  Will resume Vascepa.  He is statin intolerant.   #Mechanical AVR: INR is therapeutic.  Mean gradient was 16 mmHg on echo yesterday.  Unchanged from 12/2020.  #Moderate mitral regurgitation, mild to moderate mitral stenosis: Mean gradient 6 mmHg.  Unchanged from 12/2020.He is euvolemic on exam.  #Hypertension: He takes spironolactone daily and hydralazine as needed at home.  Blood pressure is poorly controlled.  We will resume his home spironolactone.  [facility] will sign off.   Medication Recommendations:  resume Vascepa and spironolactone Other recommendations (labs, testing, etc):  address lipids as an outpatient Follow up as an outpatient:  cardiology at [facility]   For questions or updates, please contact Miranda Please consult [email or web address] for contact info under        Signed, Skeet Latch, MD  10/07/2021,  10:22 AM     Attending Addendum:  History and all data above reviewed.  Patient examined.  I agree with the findings as above.  All available labs, radiology testing,  previous records reviewed. Agree with documented assessment and plan.  Mr. Snodgrass is a 87 year old man with a mechanical AVR in 2012, CAD status post CABG, diabetes, hyperlipidemia, dementia, and hypertension admitted with sepsis in the setting of recent COVID-19 infection.  Cardiology was consulted for elevated troponin.  High-sensitivity troponin is elevated, but he has no ischemic symptoms.  He denies chest pain and appears quite well on exam.  It is reasonable to get an echocardiogram.  Unless there are any inabilities on that, would plan to avoid any additional cardiac testing.  Would not treat him as though this were ACS.  His INRs have been consistently therapeutic, making it much less likely that there is mesenteric ischemia, though this cannot be completely ruled out on abdominal imaging.  Management of COVID-19 per IM  Vernice Mannina C. Oval Linsey, MD, [facility]  10/07/2021 10:22 AM

## 2021-10-07 NOTE — TOC Progression Note (Addendum)
Transition of Care [facility]) - Initial/Assessment Note    Patient Details  Name: Peter Beltran MRN: [id number] Date of Birth: Mar 04, 1925  Transition of Care [facility]) CM/SW Contact:    Milinda Antis, [place] Phone Number: 10/07/2021, 11:17 AM  Clinical Narrative:                 CSW contacted Sallee Lange with [facility] to inquire about whether the patient is LTC, ST, or at the ALF.  CSW is waiting a returned call.   11:27-  Sallee Lange with admissions at [facility] verified that the patient is a resident in the [facility].  The facility will have a room for him in the ST- SNF should rehab be needed when he returns.       Patient Goals and CMS Choice        Expected Discharge Plan and Services                                                Prior Living Arrangements/Services                       Activities of Daily Living Home Assistive Devices/Equipment: Electric scooter, Radio producer (specify quad or straight), Wheelchair ADL Screening (condition at time of admission) Patient's cognitive ability adequate to safely complete daily activities?: Yes Is the patient deaf or have difficulty hearing?: Yes Does the patient have difficulty seeing, even when wearing glasses/contacts?: No Does the patient have difficulty concentrating, remembering, or making decisions?: Yes Patient able to express need for assistance with ADLs?: Yes Does the patient have difficulty dressing or bathing?: Yes Independently performs ADLs?: No Does the patient have difficulty walking or climbing stairs?: Yes Weakness of Legs: Both Weakness of Arms/Hands: None  Permission Sought/Granted                  Emotional Assessment              Admission diagnosis:  Elevated LFTs [R79.89] NSTEMI (non-ST elevated myocardial infarction) ([place]) [I21.4] Sepsis ([place]) [A41.9] Sepsis with acute organ dysfunction, due to unspecified organism, unspecified type, unspecified whether septic shock  present ([place]) [A41.9, R65.20] COVID-19 [U07.1] Patient Active Problem List   Diagnosis Date Noted   Severe sepsis ([place]) 10/06/2021   Elevated troponin 10/06/2021   Vomiting and diarrhea 10/06/2021   Fall 04/30/2021   Periodic limb movement disorder 04/30/2021   Bradycardia with less than 60 beats per minute 04/29/2021   Gout    Hyperplasia of prostate    Hypothyroidism    Myocardial infarct ([place])    Pruritus, unspecified 11/10/2020   Unspecified jaundice 11/10/2020   Lab test positive for detection of COVID-19 virus 11/10/2020   Cognitive communication deficit 10/28/2020   Disorders of muscle in diseases classified elsewhere, multiple sites 10/28/2020   History of falling 10/28/2020   Muscle weakness (generalized) 10/28/2020   Oral mucositis (ulcerative), unspecified 10/28/2020   Other specified disorders of nose and nasal sinuses 10/28/2020   Traumatic subarachnoid hemorrhage without loss of consciousness, subsequent encounter 10/28/2020   Aortic stenosis    Hypertension    Status post shoulder joint replacement    Chronic rhinitis 12/27/2019   Old myocardial infarction 08/22/2019   Other malaise 07/25/2019   Cellulitis, unspecified 07/25/2019   Disorder of the skin and subcutaneous tissue, unspecified 07/25/2019   Encounter  for immunization 07/25/2019   Localized edema 07/25/2019   Acute kidney failure, unspecified ([place]) 07/22/2019   Status post aortic valve replacement with metallic valve 50/07/3817   Essential hypertension 01/19/2019   Mixed dyslipidemia 01/19/2019   Follow-up visit for aortic valve replacement with metallic valve 29/93/7169   Overweight 01/19/2019   Unspecified abnormalities of gait and mobility 10/18/2018   Presence of prosthetic heart valve 10/18/2018   Presence of aortocoronary bypass graft 10/18/2018   Pain in thoracic spine 10/18/2018   Generalized edema 10/18/2018   Hypertensive heart disease with heart failure ([place]) 10/18/2018   Dry mouth,  unspecified 10/18/2018   Urinary retention 07/18/2018   Unspecified urinary incontinence 05/17/2018   Nasal congestion 02/04/2018   Allergic rhinitis, unspecified 02/04/2018   Vitamin D deficiency, unspecified 08/16/2017   Type 2 diabetes mellitus with diabetic neuropathy, unspecified ([place]) 08/16/2017   Rash and other nonspecific skin eruption 08/16/2017   Low back pain 08/16/2017   Long term (current) use of aspirin 08/16/2017   Iron deficiency anemia, unspecified 08/16/2017   Impacted cerumen, bilateral 08/16/2017   Acute maxillary sinusitis, unspecified 08/16/2017   Candidal stomatitis 08/16/2017   Constipation, unspecified 08/16/2017   Atherosclerotic heart disease of native coronary artery without angina pectoris 08/16/2017   Benign prostatic hyperplasia with lower urinary tract symptoms 08/16/2017   Constipation 08/16/2017   Monitoring for long-term anticoagulant use 08/03/2017   Hyperlipidemia, unspecified 11/11/2016   Hypertriglyceridemia 11/11/2016   Diabetic polyneuropathy associated with type 2 diabetes mellitus ([place]) 10/04/2016   Mild cognitive impairment 08/30/2016   Coronary artery disease 04/10/2016   Hypothyroidism, unspecified 04/10/2016   Dyslipidemia 04/10/2016   Subtherapeutic anticoagulation 04/10/2016   Chronic coronary artery disease 04/10/2016   Memory loss, short term 03/30/2016   Foreign body aspiration 03/05/2016   Moderate persistent asthma, uncomplicated 67/89/3810   Moderate persistent asthma without complication 17/51/0258   Ischemic optic neuropathy of both eyes 01/28/2016   Low-tension glaucoma, bilateral, 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/2016   Low-tension glaucoma of both eyes, 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/2016   Spinal stenosis, lumbar region without neurogenic claudication 04/30/2015   Lumbar stenosis 04/30/2015   Hidrocystoma of left eyelid 12/10/2014   Pseudophakia of both eyes 12/10/2014   Neuropathy 04/04/2014   Gastro-esophageal reflux disease without  esophagitis 02/26/2014   Esophageal reflux 02/26/2014   Psoriasis and similar disorder 01/28/2014   Obstructive sleep apnea 12/17/2013   Obstructive sleep apnea of adult 12/17/2013   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/28/2013   Anxiety disorder, unspecified 09/17/2013   Anxiety state 09/17/2013   Restless legs syndrome 08/26/2013   Spells 08/26/2013   Periodic limb movements of sleep 08/26/2013   Hypersomnia 06/14/2013   Syncope and collapse 06/14/2013   Aortic valve disorder 05/29/2013   Enlarged prostate with lower urinary tract symptoms (LUTS) 05/29/2013   Gouty arthropathy 05/29/2013   Long term (current) use of anticoagulants 05/29/2013   Encounter for current long-term use of anticoagulants 05/29/2013   Chronic airway obstruction ([place]) 04/21/2013   Disturbance of salivary secretion 04/21/2013   Dysphagia 04/21/2013   Impaired fasting glucose 04/21/2013   Unspecified osteoarthritis, unspecified site 04/21/2013   Osteoarthrosis 04/21/2013   PCP:  Javier Glazier, MD Pharmacy:   [facility], [place] - [address] [place] [address] Phone: [phone] Fax: [phone]  [facility] - [place], [place] - [address] [place] [address] Phone: [phone] Fax: [phone]  Social Determinants of Health (SDOH) Interventions    Readmission Risk Interventions No flowsheet data found.   

## 2021-10-07 NOTE — Progress Notes (Signed)
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 coli bacteremia Coverage changed to ceftriaxone as blood cultures growing E. Coli Narrow IV antibiotics to p.o. antibiotics for duration of 2 weeks minimum when culture results available Elevated troponin Cardiology consulted-secondary to infection Echocardiogram performed rules out myocarditis shows grade 2 diastolic dysfunction with no wall motion abnormalities Further care as per cardiology Recent coronavirus 19 infection Recent Rx Paxlovid--fully recovered Nausea vomiting diarrhea on admission Diarrhea work-up including C. difficile pathogen panel discontinued by GI Transaminitis Acute hepatitis panels are negative-right upper quadrant ultrasound showed gallbladder sludge only GI consulted recommends conservative management outpatient follow-up with [facility] Hold off on Tricor at this time as per GI and allopurinol 300 for now Patient received ritonavir recent hospital stay-this could account for elevated transaminases-they are summary equilibration bilirubin is high at 5.0-would trend but as long this continues to trend downwards no further inpatient work-up Mechanical aortic  valve INR therapeutic at 2.9--continue Coumadin per pharmacy and dosing as per them CABG X3 2008 Continue Vascepa 2 g twice daily, Aldactone 25 daily resumed CKD 3 A Creatinines steadily trending downward Hyperthyroidism? Unlikely and do not feel can interpret correctly in the setting of acute illness  DVT prophylaxis: On Coumadin Code Status: DNR Family Communication: called Cristy Hilts [phone] and  Disposition:  Status is: Inpatient  Remains inpatient appropriate because:  Continues to need IV antibiotics awaiting culture results etc.    Consultants:  Infectious disease Cardiology GI  Procedures: Echocardiogram 11/21  Antimicrobials: Ceftriaxone   Subjective: Patient feels fair-nursing feels patient has some mild underlying confusion No chest pain no chills No rigor no diarrhea  Objective: Vitals:   10/06/21 1400 10/06/21 1812 10/06/21 2032 10/07/21 0528  BP: 133/71 (!) 176/77 (!) 150/61 (!) 180/76  Pulse: 77 91 87 91  Resp: 17 16    Temp:  99.4 F (37.4 C) 99.3 F (37.4 C) 98.8 F (37.1 C)  TempSrc:  Oral Oral Oral  SpO2: 97% 96% 99% 94%  Weight:      Height:        Intake/Output Summary (Last 24 hours) at 10/07/2021 0758 Last data filed at 10/06/2021 2200 Gross per 24 hour  Intake 240 ml  Output 800 ml  Net -560 ml   Filed Weights   10/05/21 1857  Weight: 99.8 kg    Examination:  EOMI NCAT no focal deficit CTA B no rales rhonchi Abdomen soft no rebound no guarding ROM intact moving all 4 limbs equally without deficit Neurologically intact Psych euthymic congruent no distress  Data Reviewed: personally reviewed   CBC    Component Value Date/Time   WBC 10.4 10/07/2021 0228   RBC 3.23 (L) 10/07/2021 0228   HGB 10.3 (L) 10/07/2021 0228   HGB 12.3 (L) 04/15/2021 1614   HCT 30.5 (L) 10/07/2021 5397  HCT 35.2 (L) 04/15/2021 1614   PLT 117 (L) 10/07/2021 0228   PLT 171 04/15/2021 1614   MCV 94.4 10/07/2021 0228   MCV 94  04/15/2021 1614   MCH 31.9 10/07/2021 0228   MCHC 33.8 10/07/2021 0228   RDW 14.6 10/07/2021 0228   RDW 14.2 04/15/2021 1614   LYMPHSABS 0.3 (L) 10/05/2021 1903   LYMPHSABS 2.4 04/15/2021 1614   MONOABS 0.6 10/05/2021 1903   EOSABS 0.0 10/05/2021 1903   EOSABS 0.4 04/15/2021 1614   BASOSABS 0.0 10/05/2021 1903   BASOSABS 0.0 04/15/2021 1614   CMP Latest Ref Rng & Units 10/07/2021 10/06/2021 10/05/2021  Glucose 70 - 99 mg/dL 135(H) 168(H) 143(H)  BUN 8 - 23 mg/dL 22 25(H) 21  Creatinine 0.61 - 1.24 mg/dL 1.11 1.49(H) 1.21  Sodium 135 - 145 mmol/L 132(L) 133(L) 135  Potassium 3.5 - 5.1 mmol/L 3.7 4.2 3.1(L)  Chloride 98 - 111 mmol/L 104 101 99  CO2 22 - 32 mmol/L 19(L) 21(L) 23  Calcium 8.9 - 10.3 mg/dL 9.0 8.8(L) 9.6  Total Protein 6.5 - 8.1 g/dL 6.3(L) 6.0(L) 6.8  Total Bilirubin 0.3 - 1.2 mg/dL 5.0(H) 4.6(H) 4.3(H)  Alkaline Phos 38 - 126 U/L 57 40 47  AST 15 - 41 U/L 105(H) 124(H) 146(H)  ALT 0 - 44 U/L 62(H) 65(H) 70(H)     Radiology Studies: CT HEAD WO CONTRAST (5MM)  Result Date: 10/06/2021 CLINICAL DATA:  87 year old male with history of delirium. EXAM: CT HEAD WITHOUT CONTRAST TECHNIQUE: Contiguous axial images were obtained from the base of the skull through the vertex without intravenous contrast. COMPARISON:  Head CT 05/04/2021. FINDINGS: Brain: Moderate cerebral atrophy. Patchy and confluent areas of decreased attenuation are noted throughout the deep and periventricular white matter of the cerebral hemispheres bilaterally, compatible with chronic microvascular ischemic disease. More well-defined area of low attenuation in the left thalamus, similar to the prior study, compatible with an old lacunar infarct. Additional small well-defined focus of low attenuation in the superior aspect of the left cerebellar hemisphere also compatible with an old lacunar infarct. No evidence of acute infarction, hemorrhage, hydrocephalus, extra-axial collection or mass lesion/mass effect.  Vascular: Multiple vascular calcifications noted in the vertebrobasilar and carotid systems. Skull: Normal. Negative for fracture or focal lesion. Sinuses/Orbits: Multifocal mucosal thickening in the paranasal sinuses bilaterally, with complete opacification of the right frontal sinus, frontoethmoidal recess, and many of the anterior right ethmoid cells. Widespread mucosal thickening is noted in the other ethmoid cells, maxillary sinuses and sphenoid sinuses bilaterally. Small air-fluid level noted in the left maxillary sinus. Other: None. IMPRESSION: 1. No acute intracranial abnormalities. 2. 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 3. Extensive chronic paranasal sinus disease. Additionally, there is a small air-fluid level in the left maxillary sinus. Clinical correlation for signs and symptoms of acute sinusitis is recommended. Electronically Signed   By: Vinnie Langton M.D.   On: 10/06/2021 06:58   DG Chest Port 1 View  Result Date: 10/05/2021 CLINICAL DATA:  Questionable sepsis. EXAM: PORTABLE CHEST 1 VIEW COMPARISON:  Chest radiograph dated 05/04/2021. FINDINGS: Diffuse chronic interstitial coarsening. There is cardiomegaly with central vascular congestion. No focal consolidation, pleural effusion or pneumothorax. Median sternotomy wires and CABG vascular clips. Atherosclerotic calcification of the aorta. No acute osseous pathology degenerative changes spine. Partially visualized right shoulder arthroplasty. IMPRESSION: Cardiomegaly with central vascular  congestion. No focal consolidation. Electronically Signed   By: Anner Crete M.D.   On: 10/05/2021 20:50   ECHOCARDIOGRAM COMPLETE  Result Date: 10/06/2021    ECHOCARDIOGRAM REPORT   Patient Name:   Peter Beltran Date of Exam: 10/06/2021 Medical Rec #:   [id number]           Height:       68.0 in Accession #:    [id number]          Weight:       220.0 lb Date of Birth:  [birth year]            BSA:          2.128 m Patient Age:    89 years            BP:           114/48 mmHg Patient Gender: M                   HR:           74 bpm. Exam Location:  Inpatient Procedure: 2D Echo, Cardiac Doppler, Color Doppler and Intracardiac            Opacification Agent Indications:    Elevated troponin  History:        Patient has prior history of Echocardiogram examinations, most                 recent 01/05/2021. CAD, Signs/Symptoms:Syncope; Risk                 Factors:Dyslipidemia.  Sonographer:    Glo Herring Referring Phys: [id number] [place]  1. Left ventricular ejection fraction, by estimation, is 60 to 65%. The left ventricle has normal function. The left ventricle has no regional wall motion abnormalities. There is mild left ventricular hypertrophy. Left ventricular diastolic parameters are consistent with Grade II diastolic dysfunction (pseudonormalization).  2. Right ventricular systolic function is mildly reduced. The right ventricular size is mildly enlarged.  3. Left atrial size was mild to moderately dilated.  4. Right atrial size was moderately dilated.  5. Moderate mitral valve regurgitation, ERO 0.3 cm^2. Mild to moderate mitral stenosis. The mean mitral valve gradient is 6.0 mmHg with MVA 1.68 cm^2 by VTI. Moderate mitral annular calcification.  6. Mechanical aortic valve. Mean gradient 16 mmHg with EOA 1.5 cm^2, DI 0.44. No aortic insufficiency noted.  7. Aortic dilatation noted. There is mild dilatation of the ascending aorta, measuring 38 mm.  8. The inferior vena cava is dilated in size with <50% respiratory variability, suggesting right atrial pressure of 15 mmHg. FINDINGS  Left Ventricle: Left ventricular ejection fraction, by estimation, is 60 to 65%. The left ventricle has normal function. The left ventricle has no regional wall  motion abnormalities. The left ventricular internal cavity size was normal in size. There is  mild left ventricular hypertrophy. Left ventricular diastolic parameters are consistent with Grade II diastolic dysfunction (pseudonormalization). Right Ventricle: The right ventricular size is mildly enlarged. No increase in right ventricular wall thickness. Right ventricular systolic function is mildly reduced. Left Atrium: Left atrial size was mild to moderately dilated. Right Atrium: Right atrial size was moderately dilated. Pericardium: There is no evidence of pericardial effusion. Mitral Valve: The mitral valve is degenerative in appearance. There is moderate calcification of the mitral valve leaflet(s). Moderate mitral annular calcification. Moderate mitral valve regurgitation. Mild to moderate mitral valve stenosis. MV peak gradient, 11.7 mmHg. The mean  mitral valve gradient is 6.0 mmHg. Tricuspid Valve: The tricuspid valve is normal in structure. Tricuspid valve regurgitation is mild. Aortic Valve: Mechanical aortic valve. Mean gradient 16 mmHg with EOA 1.5 cm^2, DI 0.44. No aortic insufficiency noted. The aortic valve was not well visualized. Aortic valve regurgitation is not visualized. Aortic valve mean gradient measures 15.0 mmHg.  Aortic valve peak gradient measures 27.7 mmHg. Aortic valve area, by VTI measures 1.41 cm. Pulmonic Valve: The pulmonic valve was normal in structure. Pulmonic valve regurgitation is trivial. Aorta: The aortic root is normal in size and structure and aortic dilatation noted. There is mild dilatation of the ascending aorta, measuring 38 mm. Venous: The inferior vena cava is dilated in size with less than 50% respiratory variability, suggesting right atrial pressure of 15 mmHg. IAS/Shunts: No atrial level shunt detected by color flow Doppler.  LEFT VENTRICLE PLAX 2D LVIDd:         5.70 cm   Diastology LVIDs:         3.90 cm   LV e' medial:    3.48 cm/s LV PW:         1.40 cm   LV E/e'  medial:  52.6 LV IVS:        1.40 cm   LV e' lateral:   3.92 cm/s LVOT diam:     2.00 cm   LV E/e' lateral: 46.7 LV SV:         76 LV SV Index:   36 LVOT Area:     3.14 cm  RIGHT VENTRICLE            IVC RV Basal diam:  4.00 cm    IVC diam: 2.50 cm RV S prime:     6.64 cm/s LEFT ATRIUM             Index        RIGHT ATRIUM           Index LA diam:        5.60 cm 2.63 cm/m   RA Area:     26.20 cm LA Vol (A2C):   83.8 ml 39.38 ml/m  RA Volume:   79.60 ml  37.40 ml/m LA Vol (A4C):   85.0 ml 39.94 ml/m LA Biplane Vol: 84.4 ml 39.66 ml/m  AORTIC VALVE                     PULMONIC VALVE AV Area (Vmax):    1.52 cm      PV Vmax:       0.91 m/s AV Area (Vmean):   1.42 cm      PV Peak grad:  3.3 mmHg AV Area (VTI):     1.41 cm AV Vmax:           263.33 cm/s AV Vmean:          184.333 cm/s AV VTI:            0.541 m AV Peak Grad:      27.7 mmHg AV Mean Grad:      15.0 mmHg LVOT Vmax:         127.50 cm/s LVOT Vmean:        83.350 cm/s LVOT VTI:          0.242 m LVOT/AV VTI ratio: 0.45  AORTA Ao Root diam: 3.30 cm Ao Asc diam:  3.80 cm MITRAL VALVE  TRICUSPID VALVE MV Area (PHT): 3.45 cm       TR Peak grad:   33.4 mmHg MV Area VTI:   1.62 cm       TR Vmax:        289.00 cm/s MV Peak grad:  11.7 mmHg MV Mean grad:  6.0 mmHg       SHUNTS MV Vmax:       1.71 m/s       Systemic VTI:  0.24 m MV Vmean:      113.3 cm/s     Systemic Diam: 2.00 cm MV Decel Time: 220 msec MR Peak grad:    92.5 mmHg MR Mean grad:    63.0 mmHg MR Vmax:         481.00 cm/s MR Vmean:        382.0 cm/s MR PISA:         4.02 cm MR PISA Eff ROA: 30 mm MR PISA Radius:  0.80 cm MV E velocity: 183.00 cm/s MV A velocity: 139.00 cm/s MV E/A ratio:  1.32 Dalton McleanMD Electronically signed by Franki Monte Signature Date/Time: 10/06/2021/12:22:54 PM    Final    CT Angio Chest/Abd/Pel for Dissection W and/or Wo Contrast  Result Date: 10/05/2021 CLINICAL DATA:  Nausea and vomiting, evaluate for possible mesenteric ischemia. EXAM:  CT ANGIOGRAPHY CHEST, ABDOMEN AND PELVIS TECHNIQUE: Non-contrast CT of the chest was initially obtained. Multidetector CT imaging through the chest, abdomen and pelvis was performed using the standard protocol during bolus administration of intravenous contrast. Multiplanar reconstructed images and MIPs were obtained and reviewed to evaluate the vascular anatomy. CONTRAST:  193mL OMNIPAQUE IOHEXOL 350 MG/ML SOLN COMPARISON:  None. FINDINGS: CTA CHEST FINDINGS Cardiovascular: Initial precontrast images demonstrate atherosclerotic calcification as well as findings of prior coronary bypass grafting. No aneurysmal dilatation is seen. No hyperdense crescent to suggest aortic injury is noted. Post-contrast images show no evidence of dissection. The pulmonary artery shows a normal branching pattern. No evidence of pulmonary emboli are seen. Coronary calcifications and changes of prior coronary bypass are noted. No cardiac enlargement is seen. Aortic valve replacement is noted. No pericardial effusion is noted. Mediastinum/Nodes: Thoracic inlet is within normal limits. No sizable hilar or mediastinal adenopathy is noted. The esophagus as visualized is within normal limits. Lungs/Pleura: Lungs are well aerated bilaterally. No focal infiltrate or sizable effusion is seen. No sizable parenchymal nodules are noted. Mild scarring is noted in the lower lobes bilaterally. Musculoskeletal: Degenerative changes of the thoracic spine are noted. No rib fractures are seen. No compression deformities are noted. Review of the MIP images confirms the above findings. CTA ABDOMEN AND PELVIS FINDINGS VASCULAR Aorta: Abdominal aorta demonstrates diffuse atherosclerotic calcifications. No aneurysmal dilatation or dissection is noted. Celiac: Atherosclerotic calcifications of the origin of the celiac axis are seen although no focal stenosis is noted. SMA: Atherosclerotic calcification is noted without focal stenosis. Renals: Dual renal arteries  are noted on the right with single renal artery on the left. No stenotic changes are seen. IMA: Patent without evidence of aneurysm, dissection, vasculitis or significant stenosis. Inflow: Iliac arteries demonstrate atherosclerotic calcification without aneurysmal dilatation or dissection. Veins: No specific venous abnormality is noted. Review of the MIP images confirms the above findings. NON-VASCULAR Hepatobiliary: No focal liver abnormality is seen. No gallstones, gallbladder wall thickening, or biliary dilatation. Pancreas: Unremarkable. No pancreatic ductal dilatation or surrounding inflammatory changes. Spleen: Normal in size without focal abnormality. Adrenals/Urinary Tract: Adrenal glands are within normal limits bilaterally. Normal enhancement  is noted in the kidneys bilaterally. No renal calculi or obstructive changes are seen. A tiny cyst is noted within the left kidney measuring less than 1 cm. No obstructive changes are seen. The ureters are unremarkable. The bladder is well distended. Stomach/Bowel: Scattered diverticular change of the colon is noted without evidence of diverticulitis. No obstructive changes of the colon are noted. The appendix is not well visualized. No inflammatory changes to suggest appendicitis are noted. The small bowel and stomach are unremarkable. Lymphatic: No sizable lymphadenopathy is noted. Reproductive: Prostate is diminutive likely related to the prior ablation Other: No abdominal wall hernia or abnormality. No abdominopelvic ascites. Musculoskeletal: Degenerative changes of lumbar spine are noted. No acute bony abnormality is seen. Degenerative changes of the sacroiliac joints are noted as well. Review of the MIP images confirms the above findings. IMPRESSION: No evidence of mesenteric ischemia. Diverticulosis without diverticulitis. No acute abnormality noted. Aortic Atherosclerosis (ICD10-I70.0). Electronically Signed   By: Inez Catalina M.D.   On: 10/05/2021 22:23    US Abdomen Limited RUQ (LIVER/GB)  Result Date: 10/06/2021 CLINICAL DATA:  Elevated LFTs. EXAM: ULTRASOUND ABDOMEN LIMITED RIGHT UPPER QUADRANT COMPARISON:  10/05/2021. FINDINGS: Gallbladder: Sludge is noted in the gallbladder. No gallstones or wall thickening visualized. No sonographic Murphy sign noted by sonographer. Common bile duct: Diameter: 6.3 mm, within normal limits for patient's stated age. Liver: No focal lesion identified. Within normal limits in parenchymal echogenicity. Portal vein is patent on color Doppler imaging with normal direction of blood flow towards the liver. Other: No free fluid. IMPRESSION: Gallbladder sludge without evidence of acute cholecystitis. Electronically Signed   By: Brett Fairy M.D.   On: 10/06/2021 04:48     Scheduled Meds:  aspirin  325 mg Oral Daily   warfarin  5 mg Oral ONCE-1600   Warfarin - Pharmacist Dosing Inpatient   Does not apply q1600   Continuous Infusions:  cefTRIAXone (ROCEPHIN)  IV 2 g (10/06/21 2200)     LOS: 1 day   Time spent: [place], MD [facility] To contact the attending provider between 7A-7P or the covering provider during after hours 7P-7A, please log into the web site [email or web address] and access using universal [place] password for that web site. If you do not have the password, please call the hospital operator.  10/07/2021, 7:58 AM

## 2021-10-08 DIAGNOSIS — N1831 Chronic kidney disease, stage 3a: Secondary | ICD-10-CM | POA: Insufficient documentation

## 2021-10-08 DIAGNOSIS — Z8616 Personal history of COVID-19: Secondary | ICD-10-CM

## 2021-10-08 DIAGNOSIS — R2689 Other abnormalities of gait and mobility: Secondary | ICD-10-CM | POA: Insufficient documentation

## 2021-10-08 DIAGNOSIS — B9620 Unspecified Escherichia coli [E. coli] as the cause of diseases classified elsewhere: Secondary | ICD-10-CM | POA: Insufficient documentation

## 2021-10-08 HISTORY — DX: Other abnormalities of gait and mobility: R26.89

## 2021-10-08 HISTORY — DX: Chronic kidney disease, stage 3a: N18.31

## 2021-10-08 HISTORY — DX: Personal history of COVID-19: Z86.16

## 2021-10-08 HISTORY — DX: Unspecified Escherichia coli (E. coli) as the cause of diseases classified elsewhere: B96.20

## 2021-10-08 LAB — CULTURE, BLOOD (ROUTINE X 2)
Special Requests: ADEQUATE
Special Requests: ADEQUATE

## 2021-10-08 LAB — RESP PANEL BY RT-PCR (FLU A&B, COVID) ARPGX2
Influenza A by PCR: NEGATIVE
Influenza B by PCR: NEGATIVE
SARS Coronavirus 2 by RT PCR: NEGATIVE

## 2021-10-08 LAB — PROTIME-INR
INR: 2.2 — ABNORMAL HIGH (ref 0.8–1.2)
Prothrombin Time: 24.7 seconds — ABNORMAL HIGH (ref 11.4–15.2)

## 2021-10-08 MED ORDER — WARFARIN SODIUM 5 MG PO TABS: 5.0000 mg | ORAL_TABLET | Freq: Once | ORAL | Status: DC

## 2021-10-08 MED ORDER — CEFDINIR 300 MG PO CAPS: 300.0000 mg | ORAL_CAPSULE | Freq: Two times a day (BID) | ORAL | 0 refills | Status: AC

## 2021-10-08 MED ORDER — CEFDINIR 300 MG PO CAPS
300.0000 mg | ORAL_CAPSULE | Freq: Two times a day (BID) | ORAL | Status: DC
  Administered 2021-10-08: 300 mg via ORAL
  Filled 2021-10-08 (×2): qty 1

## 2021-10-08 NOTE — TOC Transition Note (Signed)
Transition of Care [facility]) - CM/SW Discharge Note   Patient Details  Name: IZAYA NETHERTON MRN: [id number] Date of Birth: 10-12-1925  [facility] CM/SW Contact:  Vinie Sill, LCSW Phone Number: 10/08/2021, 10:20 AM   Clinical Narrative:     Patient will Discharge to: Early  Discharge Date: 10/08/2021 Family Notified: spouse Transport [place]:[facility]  Per MD patient is ready for discharge. RN, patient, and facility notified of discharge. Discharge Summary sent to facility. RN given number for [phone], [facility]. Ambulance transport requested for patient.   Clinical Social Worker signing off.  Thurmond Butts, MSW, LCSW Clinical Social Worker     Final next level of care: Skilled Nursing Facility Barriers to Discharge: Barriers Resolved   Patient Goals and CMS Choice        Discharge Placement              Patient chooses bed at: [facility] Patient to be transferred to facility by: [place] Name of family member notified: spouse Patient and family notified of of transfer: 10/08/21  Discharge Plan and Services                                     Social Determinants of Health (SDOH) Interventions     Readmission Risk Interventions No flowsheet data found.

## 2021-10-08 NOTE — Progress Notes (Signed)
SATURATION QUALIFICATIONS: Pt. Is unable to ambulate  Patient Saturations on Room Air at Rest = 96%

## 2021-10-08 NOTE — Progress Notes (Signed)
[place] for Warfarin  Indication: Mechanical AVR  Allergies  Allergen Reactions   Atorvastatin Calcium Other (See Comments)    Developed myopathy.     Dorzolamide Other (See Comments)    Follicula     Brimonidine Other (See Comments)    Red and burning eyes   Brinzolamide-Brimonidine Swelling    Redness in eyes - reaction to Paguate [Atorvastatin Calcium] Other (See Comments)    Developed myopathy.   Rotigotine Other (See Comments)    Falling asleep uncontrollably - reaction to Neupro   Tamsulosin Other (See Comments)    Unknown reaction (Flomax)   Zolpidem Other (See Comments)    Pt. states that he possibly had a neuro reaction.   Dutasteride Rash    Reaction to Avodart   Vancomycin Rash    Patient Measurements: Height: 5\' 8"  (172.7 cm) Weight: 99.8 kg (220 lb) IBW/kg (Calculated) : 68.4  Vital Signs: Temp: 99 F (37.2 C) (11/24 0939) Temp Source: Oral (11/24 0537) BP: 138/76 (11/24 0939) Pulse Rate: 73 (11/24 0939)  Labs: Recent Labs    10/05/21 1903 10/05/21 1937 10/05/21 2225 10/06/21 0413 10/06/21 0426 10/06/21 2037 10/07/21 0228 10/08/21 0240  HGB 12.3*  --   --  10.2*  --   --  10.3*  --   HCT 35.7*  --   --  30.4*  --   --  30.5*  --   PLT 162  --   --  130*  --   --  117*  --   APTT 44*  --   --   --   --   --   --   --   LABPROT 33.7*  --   --  34.5*  --   --  30.6* 24.7*  INR 3.3*  --   --  3.4*  --   --  2.9* 2.2*  CREATININE 1.21  --   --  1.49*  --   --  1.11  --   TROPONINIHS  --    < > 550*  --  1,321* 1,245*  --   --    < > = values in this interval not displayed.     Estimated Creatinine Clearance: 46.6 mL/min (by C-G formula based on SCr of 1.11 mg/dL).   Medical History: Past Medical History:  Diagnosis Date   Acute kidney failure, unspecified ([place]) 07/22/2019   Acute maxillary sinusitis, unspecified 08/16/2017   Allergic rhinitis, unspecified 02/04/2018   Anxiety  disorder, unspecified 09/17/2013   Aortic stenosis    post aortic valve replacement with [employer].Jude's valve by Dr.VanTrigt on 07-27-11   Aortic valve disorder 05/29/2013   Atherosclerotic heart disease of native coronary artery without angina pectoris 08/16/2017   Benign prostatic hyperplasia with lower urinary tract symptoms 08/16/2017   Bradycardia with less than 60 beats per minute 04/29/2021   Candidal stomatitis 08/16/2017   Cellulitis, unspecified 07/25/2019   Chronic airway obstruction ([place]) 04/21/2013   Chronic coronary artery disease 04/10/2016   Formatting of this note might be different from the original. Overview:  status post CABG x3 in 2008 Formatting of this note might be different from the original. Overview:  Overview:  status post CABG x3 in 2008   Chronic rhinitis 12/27/2019   Last Assessment & Plan:  Concern over his nose. Chronic recurring nasal congestion.  Had a severe episode recently that responded to over-the-counter medication.  Currently asymptomatic.  Wants to make sure  all is okay. EXAM intranasally shows no polyps, purulence, masses or obstructing anatomy.  Mucosa is overall healthy. PLAN: Reassured all looks okay.  I think what he is doing is fine.  Happy t   Cognitive communication deficit 10/28/2020   Constipation, unspecified 08/16/2017   Coronary artery disease    status post CABG x3 in 2008   Diabetic polyneuropathy associated with type 2 diabetes mellitus ([place]) 10/04/2016   Disorder of the skin and subcutaneous tissue, unspecified 07/25/2019   Disorders of muscle in diseases classified elsewhere, multiple sites 10/28/2020   Disturbance of salivary secretion 04/21/2013   Dry mouth, unspecified 10/18/2018   Dyslipidemia    Dysphagia 04/21/2013   Encounter for current long-term use of anticoagulants 05/29/2013   Formatting of this note might be different from the original. IMO routine update Formatting of this note might be different from the original.  Overview:  IMO routine update   Enlarged prostate with lower urinary tract symptoms (LUTS) 05/29/2013   Esophageal reflux 02/26/2014   Essential hypertension 01/19/2019   Fall 04/30/2021   Foreign body aspiration 03/05/2016   Gastro-esophageal reflux disease without esophagitis 02/26/2014   Generalized edema 10/18/2018   Gout    Hidrocystoma of left eyelid 12/10/2014   History of falling 10/28/2020   Hyperlipidemia, unspecified 11/11/2016   Hyperplasia of prostate    benign   Hypersomnia 06/14/2013   Hypertension    Hypertensive heart disease with heart failure ([place]) 10/18/2018   Hypertriglyceridemia 11/11/2016   Hypothyroidism    Hypothyroidism, unspecified 04/10/2016   Impaired fasting glucose 04/21/2013   Iron deficiency anemia, unspecified 08/16/2017   Ischemic optic neuropathy of both eyes 01/28/2016   Localized edema 07/25/2019   Long term (current) use of anticoagulants 05/29/2013   Overview:  IMO routine update IMO routine update   Long term (current) use of aspirin 08/16/2017   Low back pain 08/16/2017   Low-tension glaucoma of both eyes, 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/2016   tmax 12/14 OCT 11/03/2016 VF 12/04/2015 Gonio 05/07/2015 tmax 12/14 OCT 11/03/2016 VF 12/04/2015 Gonio 05/07/2015   Lumbar stenosis 04/30/2015   Memory loss, short term 03/30/2016   Mild cognitive impairment 08/30/2016   Mixed dyslipidemia 01/19/2019   Moderate persistent asthma without complication 29/51/8841   Moderate persistent asthma, uncomplicated 66/04/3015   Monitoring for long-term anticoagulant use 08/03/2017   Muscle weakness (generalized) 10/28/2020   Myocardial infarct (HCC)    Nasal congestion 02/04/2018   Neuropathy 04/04/2014   Obstructive sleep apnea 12/17/2013   Obstructive sleep apnea of adult 12/17/2013   Old myocardial infarction 08/22/2019   Oral mucositis (ulcerative), unspecified 10/28/2020   Osteoarthrosis 04/21/2013   Other malaise 07/25/2019   Other specified disorders of  nose and nasal sinuses 10/28/2020   Overweight 01/19/2019   Pain in thoracic spine 10/18/2018   Periodic limb movement disorder 04/30/2021   Pruritus, unspecified 11/10/2020   Pseudophakia of both eyes 12/10/2014   Psoriasis and similar disorder 01/28/2014   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/28/2013   Restless legs syndrome 08/26/2013   Spinal stenosis, lumbar region without neurogenic claudication 04/30/2015   Status post aortic valve replacement with metallic valve 11/23/3233   Status post shoulder joint replacement    right shoulder   Subtherapeutic anticoagulation 04/10/2016   Syncope and collapse 06/14/2013   Overview:  with seizure like activity. with seizure like activity.   Traumatic subarachnoid hemorrhage without loss of consciousness, subsequent encounter 10/28/2020   Type 2 diabetes mellitus with diabetic neuropathy, unspecified ([place]) 08/16/2017   Unspecified  abnormalities of gait and mobility 10/18/2018   Unspecified jaundice 11/10/2020   Unspecified osteoarthritis, unspecified site 04/21/2013   Unspecified urinary incontinence 05/17/2018   Urinary retention 07/18/2018   Vitamin D deficiency, unspecified 08/16/2017    Assessment: 87 y/o M on warfarin PTA for history of mechanical AVR. In the ED from facility with fever. Continuing warfarin. INR yesterday was 3.4. Hgb 10.2. Did have subdural hematoma in 2021. Head CT clear. Pharmacy has been consulted for warfarin dosing for mechanical AVR.   INR this AM subtherapeutic at 2.2 s/p 5mg  dose 11/23. This level is likely reflective of 3 mg dose from 11/23.  PTA dosing appears to have changed to 3mg  three day a week (W, Sa, Su) and 5mg  all other days (M, Tu, Th, Fr), per [facility].     Goal of Therapy:  INR 5.1-0.2 per note by Adele Barthel NP on 03/23/51 Monitor platelets by anticoagulation protocol: Yes   Plan:  Warfarin 5 mg PO x 1 today Daily INR, CBC, s/s bleeding  Adria Dill, PharmD PGY-1 Acute Care Resident  10/08/2021  1:28 PM

## 2021-10-08 NOTE — NC FL2 (Signed)
[place] LEVEL OF CARE SCREENING TOOL     IDENTIFICATION  Patient Name: Peter Beltran Birthdate: Sep 11, 1925 Sex: male Admission Date (Current Location): 10/05/2021  [facility] and [place] Number:  Herbalist and Address:  [facility] [place]. [facility], [place] [address], [place], [place] [address]      Provider Number: [id number]  Attending Physician Name and Address:  Nita Sells, MD  Relative Name and Phone Number:       Current Level of Care: Hospital Recommended Level of Care: [place] Prior Approval Number:    Date Approved/Denied:   PASRR Number: [id number]  Discharge Plan: SNF    Current Diagnoses: Patient Active Problem List   Diagnosis Date Noted   Severe sepsis ([place]) 10/06/2021   Elevated troponin 10/06/2021   Vomiting and diarrhea 10/06/2021   Fall 04/30/2021   Periodic limb movement disorder 04/30/2021   Bradycardia with less than 60 beats per minute 04/29/2021   Gout    Hyperplasia of prostate    Hypothyroidism    Myocardial infarct ([place])    Pruritus, unspecified 11/10/2020   Unspecified jaundice 11/10/2020   Lab test positive for detection of COVID-19 virus 11/10/2020   Cognitive communication deficit 10/28/2020   Disorders of muscle in diseases classified elsewhere, multiple sites 10/28/2020   History of falling 10/28/2020   Muscle weakness (generalized) 10/28/2020   Oral mucositis (ulcerative), unspecified 10/28/2020   Other specified disorders of nose and nasal sinuses 10/28/2020   Traumatic subarachnoid hemorrhage without loss of consciousness, subsequent encounter 10/28/2020   Aortic stenosis    Hypertension    Status post shoulder joint replacement    Chronic rhinitis 12/27/2019   Old myocardial infarction 08/22/2019   Other malaise 07/25/2019   Cellulitis, unspecified 07/25/2019   Disorder of the skin and subcutaneous tissue, unspecified 07/25/2019   Encounter for immunization  07/25/2019   Localized edema 07/25/2019   Acute kidney failure, unspecified ([place]) 07/22/2019   Status post aortic valve replacement with metallic valve 10/62/6948   Essential hypertension 01/19/2019   Mixed dyslipidemia 01/19/2019   Follow-up visit for aortic valve replacement with metallic valve 54/62/7035   Overweight 01/19/2019   Unspecified abnormalities of gait and mobility 10/18/2018   Presence of prosthetic heart valve 10/18/2018   Presence of aortocoronary bypass graft 10/18/2018   Pain in thoracic spine 10/18/2018   Generalized edema 10/18/2018   Hypertensive heart disease with heart failure ([place]) 10/18/2018   Dry mouth, unspecified 10/18/2018   Urinary retention 07/18/2018   Unspecified urinary incontinence 05/17/2018   Nasal congestion 02/04/2018   Allergic rhinitis, unspecified 02/04/2018   Vitamin D deficiency, unspecified 08/16/2017   Type 2 diabetes mellitus with diabetic neuropathy, unspecified ([place]) 08/16/2017   Rash and other nonspecific skin eruption 08/16/2017   Low back pain 08/16/2017   Long term (current) use of aspirin 08/16/2017   Iron deficiency anemia, unspecified 08/16/2017   Impacted cerumen, bilateral 08/16/2017   Acute maxillary sinusitis, unspecified 08/16/2017   Candidal stomatitis 08/16/2017   Constipation, unspecified 08/16/2017   Atherosclerotic heart disease of native coronary artery without angina pectoris 08/16/2017   Benign prostatic hyperplasia with lower urinary tract symptoms 08/16/2017   Constipation 08/16/2017   Monitoring for long-term anticoagulant use 08/03/2017   Hyperlipidemia, unspecified 11/11/2016   Hypertriglyceridemia 11/11/2016   Diabetic polyneuropathy associated with type 2 diabetes mellitus ([place]) 10/04/2016   Mild cognitive impairment 08/30/2016   Coronary artery disease 04/10/2016   Hypothyroidism, unspecified 04/10/2016   Dyslipidemia 04/10/2016  Subtherapeutic anticoagulation 04/10/2016   Chronic coronary artery  disease 04/10/2016   Memory loss, short term 03/30/2016   Foreign body aspiration 03/05/2016   Moderate persistent asthma, uncomplicated 76/22/6333   Moderate persistent asthma without complication 54/56/2563   Ischemic optic neuropathy of both eyes 01/28/2016   Low-tension glaucoma, bilateral, 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/2016   Low-tension glaucoma of both eyes, 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/2016   Spinal stenosis, lumbar region without neurogenic claudication 04/30/2015   Lumbar stenosis 04/30/2015   Hidrocystoma of left eyelid 12/10/2014   Pseudophakia of both eyes 12/10/2014   Neuropathy 04/04/2014   Gastro-esophageal reflux disease without esophagitis 02/26/2014   Esophageal reflux 02/26/2014   Psoriasis and similar disorder 01/28/2014   Obstructive sleep apnea 12/17/2013   Obstructive sleep apnea of adult 12/17/2013   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/28/2013   Anxiety disorder, unspecified 09/17/2013   Anxiety state 09/17/2013   Restless legs syndrome 08/26/2013   Spells 08/26/2013   Periodic limb movements of sleep 08/26/2013   Hypersomnia 06/14/2013   Syncope and collapse 06/14/2013   Aortic valve disorder 05/29/2013   Enlarged prostate with lower urinary tract symptoms (LUTS) 05/29/2013   Gouty arthropathy 05/29/2013   Long term (current) use of anticoagulants 05/29/2013   Encounter for current long-term use of anticoagulants 05/29/2013   Chronic airway obstruction ([place]) 04/21/2013   Disturbance of salivary secretion 04/21/2013   Dysphagia 04/21/2013   Impaired fasting glucose 04/21/2013   Unspecified osteoarthritis, unspecified site 04/21/2013   Osteoarthrosis 04/21/2013    Orientation RESPIRATION BLADDER Height & Weight     Self, Situation, Time, Place  Normal External catheter, Continent Weight: 220 lb (99.8 kg) Height:  5\' 8"  (172.7 cm)  BEHAVIORAL SYMPTOMS/MOOD NEUROLOGICAL BOWEL NUTRITION STATUS      Continent Diet (please see discharge summary)  AMBULATORY STATUS  COMMUNICATION OF NEEDS Skin     Verbally                         Personal Care Assistance Level of Assistance  Bathing, Feeding, Dressing Bathing Assistance: Limited assistance Feeding assistance: Independent Dressing Assistance: Limited assistance     Functional Limitations Info  Sight, Hearing, Speech Sight Info: Impaired Hearing Info: Impaired Speech Info: Adequate    SPECIAL CARE FACTORS FREQUENCY  PT (By licensed PT), OT (By licensed OT)     PT Frequency: eval & tx OT Frequency: eval & tx            Contractures Contractures Info: Not present    Additional Factors Info  Code Status Code Status Info: DNR             Current Medications (10/08/2021):  This is the current hospital active medication list Current Facility-Administered Medications  Medication Dose Route Frequency Provider Last Rate Last Admin   acetaminophen (TYLENOL) tablet 650 mg  650 mg Oral Q6H PRN Shela Leff, MD   650 mg at 10/06/21 2246   Or   acetaminophen (TYLENOL) suppository 650 mg  650 mg Rectal Q6H PRN Shela Leff, MD       aspirin EC tablet 81 mg  81 mg Oral Daily Skeet Latch, MD   81 mg at 10/08/21 0858   cefdinir (OMNICEF) capsule 300 mg  300 mg Oral Q12H Nita Sells, MD   300 mg at 10/08/21 8937   icosapent Ethyl (VASCEPA) 1 g capsule 2 g  2 g Oral BID Skeet Latch, MD   2 g at 10/08/21 0858   spironolactone (ALDACTONE) tablet  25 mg  25 mg Oral Daily Skeet Latch, MD   25 mg at 10/08/21 7121   Warfarin - Pharmacist Dosing Inpatient   Does not apply q1600 Bertis Ruddy, [facility]   Given at 10/07/21 1729     Discharge Medications: acetaminophen 650 MG CR tablet Commonly known as: TYLENOL Take 650 mg by mouth every 4 (four) hours as needed for pain.    albuterol 108 (90 Base) MCG/ACT inhaler Commonly known as: VENTOLIN HFA Inhale 2 puffs into the lungs every 6 (six) hours as needed for wheezing or shortness of breath.    cefdinir 300  MG capsule Commonly known as: OMNICEF Take 1 capsule (300 mg total) by mouth every 12 (twelve) hours for 10 days.    DSS 100 MG Caps Take 100 mg by mouth daily as needed for constipation.    ergocalciferol 1.25 MG (50000 UT) capsule Commonly known as: VITAMIN D2 Take 50,000 Units by mouth every Tuesday.    fluticasone 50 MCG/ACT nasal spray Commonly known as: FLONASE Place 1 spray into both nostrils in the morning and at bedtime.    Fluticasone-Salmeterol 100-50 MCG/DOSE Aepb Commonly known as: ADVAIR Inhale 2 puffs into the lungs 2 (two) times daily.    levothyroxine 112 MCG tablet Commonly known as: SYNTHROID Take 112 mcg by mouth as directed. 6 days a week    nitroGLYCERIN 0.4 MG SL tablet Commonly known as: NITROSTAT Place 0.4 mg under the tongue every 5 (five) minutes as needed for chest pain.    omeprazole 20 MG capsule Commonly known as: PRILOSEC Take 20 mg by mouth 2 (two) times daily before a meal.    rOPINIRole 0.5 MG tablet Commonly known as: REQUIP Take 0.5 mg by mouth daily.    rOPINIRole 1 MG tablet Commonly known as: REQUIP Take 1 mg by mouth daily.    Slow Fe 142 (45 Fe) MG Tbcr Generic drug: Ferrous Sulfate Take 45 mg by mouth daily.    spironolactone 25 MG tablet Commonly known as: ALDACTONE Take 25 mg by mouth daily.    warfarin 3 MG tablet Commonly known as: COUMADIN Take 3 mg by mouth See admin instructions. Take 3mg  on Wednesdays, Saturdays and Sundays,    warfarin 5 MG tablet Commonly known as: COUMADIN Take 5 mg by mouth See admin instructions. Take 5mg  on Monday, Tuesdays, Thursdays and Fridays.     Relevant Imaging Results:  Relevant Lab Results:   Additional Information SSN [id number]  Vinie Sill, LCSW

## 2021-10-08 NOTE — Discharge Summary (Signed)
Physician Discharge Summary  Peter Beltran SFK:[id number] DOB: 1925/07/20 DOA: 10/05/2021  PCP: Javier Glazier, MD  Admit date: 10/05/2021 Discharge date: 10/08/2021  Time spent: 36 minutes  Recommendations for Outpatient Follow-up:  Needs CBC Chem-12 1 week, recommend INR check in about 4 to 7 days Needs a TSH in 2 to 3 weeks as this was abnormal during context of acute hospitalization and dosage may need to be adjusted if this continues to occur Recommend chest x-ray in about 1 month to ensure vascular congestion on admitting x-ray was clear Recommend outpatient follow-up with GI for work-up of LFTs at the time Discontinued Tricor, allopurinol, held Vascepa and numerous other medication changes during hospital stay because of elevated LFTs  Discharge Diagnoses:  MAIN problem for hospitalization   bacteremia secondary to E. 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 coli bacteremia Initially placed on broad-spectrum antibiotics and transitioned  to ceftriaxone infectious disease agreed with plan of care given growing E. coli on blood culture 2 weeks total Omnicef on discharge ending 12/4 Elevated troponin-no definitive acute coronary syndrome per cardiology Cardiology consulted-secondary to infection Echocardiogram performed rules out myocarditis shows grade 2 diastolic dysfunction with no wall motion abnormalities Further care as per cardiology Recent coronavirus 19 infection Recent Rx Paxlovid--fully recovered Nausea vomiting diarrhea on admission Diarrhea work-up including C. difficile pathogen panel discontinued by GI and they felt that this could be worked up as an outpatient if he continues Transaminitis Acute hepatitis panels are negative-right upper quadrant ultrasound showed gallbladder sludge only GI consulted recommends conservative management outpatient follow-up with [facility] Hold off on Tricor at this time as per GI and allopurinol 300 for now Patient received Paxlovid which contains ritonavir recent hospital stay-this could account for elevated transaminases-they are summary equilibration bilirubin is high at 5.0-would trend but as long this continues to trend downwards no further inpatient work-up Mechanical aortic valve INR therapeutic at 2.9--continue Coumadin per pharmacy and dosing as per them CABG X3 2008 Consider resumption as outpatient Vascepa 2 g twice daily, Aldactone 25 daily resumed resumed CKD 3 A Creatinines steadily trending downward Hyperthyroidism? Unlikely and do not feel can interpret correctly in the setting of acute illness    Discharge Exam: Vitals:   10/07/21 2109 10/08/21 0537  BP: (!) 179/83 (!) 154/83  Pulse: 92 88  Resp: 18 18  Temp: 98.8 F (37.1 C) 98.3 F (36.8 C)  SpO2: 94% 96%    Subj on day of d/c   Pleasantly confused but no distress no pain no fever Nursing does not report any specific new issues  General Exam on discharge  Awake coherent sitting up in bed  eating breakfast No fever no chills no nausea Chest clear no added sound no rales no rhonchi Abdomen soft no rebound no guarding ROM intact no focal deficit J8-H6 holosystolic murmur Neurologically intact psych euthymic  Discharge Instructions   Discharge Instructions     Diet - low sodium heart healthy   Complete by: As directed    Increase activity slowly   Complete by: As directed       Allergies as of 10/08/2021       Reactions   Atorvastatin Calcium Other (See Comments)   Developed myopathy.   Dorzolamide Other (See Comments)   Follicula   Brimonidine Other (See Comments)   Red and burning eyes   Brinzolamide-brimonidine Swelling   Redness in eyes - reaction to [place] [atorvastatin Calcium] Other (See Comments)   Developed myopathy.   Rotigotine Other (See Comments)   Falling asleep uncontrollably - reaction to Neupro   Tamsulosin Other (See Comments)   Unknown reaction (Flomax)   Zolpidem Other (See Comments)   Pt. states that he possibly had a neuro reaction.   Dutasteride Rash   Reaction to Avodart   Vancomycin Rash        Medication List     STOP taking these medications    allopurinol 300 MG tablet Commonly known as: ZYLOPRIM   antiseptic oral rinse Liqd   clonazePAM 0.5 MG tablet Commonly known as: KLONOPIN   fenofibrate 145 MG tablet Commonly known as: TRICOR   gabapentin 100 MG capsule Commonly known as: NEURONTIN   guaiFENesin 100 MG/5ML liquid Commonly known as: ROBITUSSIN   hydrALAZINE 10 MG tablet Commonly known as: APRESOLINE   icosapent Ethyl 1 g capsule Commonly known as: Vascepa   latanoprost 0.005 % ophthalmic solution Commonly known as: XALATAN   magic mouthwash (nystatin, hydrocortisone, diphenhydrAMINE) suspension   ondansetron 4 MG tablet Commonly known as: ZOFRAN   predniSONE 20 MG tablet Commonly known as: DELTASONE   sodium chloride 0.65 % Soln nasal spray Commonly known as: OCEAN    timolol 0.5 % ophthalmic solution Commonly known as: TIMOPTIC       TAKE these medications    acetaminophen 650 MG CR tablet Commonly known as: TYLENOL Take 650 mg by mouth every 4 (four) hours as needed for pain.   albuterol 108 (90 Base) MCG/ACT inhaler Commonly known as: VENTOLIN HFA Inhale 2 puffs into the lungs every 6 (six) hours as needed for wheezing or shortness of breath.   cefdinir 300 MG capsule Commonly known as: OMNICEF Take 1 capsule (300 mg total) by mouth every 12 (twelve) hours for 10 days.   DSS 100 MG Caps Take 100 mg by mouth daily as needed for constipation.   ergocalciferol 1.25 MG (50000 UT) capsule Commonly known as: VITAMIN D2 Take 50,000 Units by mouth every Tuesday.   fluticasone 50 MCG/ACT nasal spray Commonly known as: FLONASE Place 1 spray into both nostrils in the morning and at bedtime.   Fluticasone-Salmeterol 100-50 MCG/DOSE Aepb Commonly known as: ADVAIR Inhale 2 puffs into the lungs 2 (two) times daily.   levothyroxine 112 MCG tablet Commonly known as: SYNTHROID Take 112 mcg by mouth as directed. 6 days a week   nitroGLYCERIN 0.4 MG SL tablet Commonly known as: NITROSTAT Place 0.4 mg under the tongue every 5 (five) minutes as needed for chest pain.   omeprazole 20 MG capsule Commonly known as: PRILOSEC Take 20 mg by mouth 2 (two) times  daily before a meal.   rOPINIRole 0.5 MG tablet Commonly known as: REQUIP Take 0.5 mg by mouth daily.   rOPINIRole 1 MG tablet Commonly known as: REQUIP Take 1 mg by mouth daily.   Slow Fe 142 (45 Fe) MG Tbcr Generic drug: Ferrous Sulfate Take 45 mg by mouth daily.   spironolactone 25 MG tablet Commonly known as: ALDACTONE Take 25 mg by mouth daily.   warfarin 3 MG tablet Commonly known as: COUMADIN Take 3 mg by mouth See admin instructions. Take 3mg  on Wednesdays, Saturdays and Sundays,   warfarin 5 MG tablet Commonly known as: COUMADIN Take 5 mg by mouth See admin  instructions. Take 5mg  on Monday, Tuesdays, Thursdays and Fridays.       Allergies  Allergen Reactions   Atorvastatin Calcium Other (See Comments)    Developed myopathy.     Dorzolamide Other (See Comments)    Follicula     Brimonidine Other (See Comments)    Red and burning eyes   Brinzolamide-Brimonidine Swelling    Redness in eyes - reaction to [place] [Atorvastatin Calcium] Other (See Comments)    Developed myopathy.   Rotigotine Other (See Comments)    Falling asleep uncontrollably - reaction to Neupro   Tamsulosin Other (See Comments)    Unknown reaction (Flomax)   Zolpidem Other (See Comments)    Pt. states that he possibly had a neuro reaction.   Dutasteride Rash    Reaction to Avodart   Vancomycin Rash    Follow-up Information     Margaretmary Bayley, MD Follow up.   Specialty: Internal Medicine Why: GI/Liver MD to contact if liver tests remain elevated. Contact information: [address] [place] [address] [phone]                  The results of significant diagnostics from this hospitalization (including imaging, microbiology, ancillary and laboratory) are listed below for reference.    Significant Diagnostic Studies: CT HEAD WO CONTRAST (5MM)  Result Date: 10/06/2021 CLINICAL DATA:  87 year old male with history of delirium. EXAM: CT HEAD WITHOUT CONTRAST TECHNIQUE: Contiguous axial images were obtained from the base of the skull through the vertex without intravenous contrast. COMPARISON:  Head CT 05/04/2021. FINDINGS: Brain: Moderate cerebral atrophy. Patchy and confluent areas of decreased attenuation are noted throughout the deep and periventricular white matter of the cerebral hemispheres bilaterally, compatible with chronic microvascular ischemic disease. More well-defined area of low attenuation in the left thalamus, similar to the prior study, compatible with an old lacunar infarct. Additional small  well-defined focus of low attenuation in the superior aspect of the left cerebellar hemisphere also compatible with an old lacunar infarct. No evidence of acute infarction, hemorrhage, hydrocephalus, extra-axial collection or mass lesion/mass effect. Vascular: Multiple vascular calcifications noted in the vertebrobasilar and carotid systems. Skull: Normal. Negative for fracture or focal lesion. Sinuses/Orbits: Multifocal mucosal thickening in the paranasal sinuses bilaterally, with complete opacification of the right frontal sinus, frontoethmoidal recess, and many of the anterior right ethmoid cells. Widespread mucosal thickening is noted in the other ethmoid cells, maxillary sinuses and sphenoid sinuses bilaterally. Small air-fluid level noted in the left maxillary sinus. Other: None. IMPRESSION: 1. No acute intracranial abnormalities. 2. 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 3. Extensive chronic paranasal sinus disease. Additionally, there is a small  air-fluid level in the left maxillary sinus. Clinical correlation for signs and symptoms of acute sinusitis is recommended. Electronically Signed   By: Vinnie Langton M.D.   On: 10/06/2021 06:58   DG Chest Port 1 View  Result Date: 10/05/2021 CLINICAL DATA:  Questionable sepsis. EXAM: PORTABLE CHEST 1 VIEW COMPARISON:  Chest radiograph dated 05/04/2021. FINDINGS: Diffuse chronic interstitial coarsening. There is cardiomegaly with central vascular congestion. No focal consolidation, pleural effusion or pneumothorax. Median sternotomy wires and CABG vascular clips. Atherosclerotic calcification of the aorta. No acute osseous pathology degenerative changes spine. Partially visualized right shoulder arthroplasty. IMPRESSION: Cardiomegaly with central vascular congestion. No focal  consolidation. Electronically Signed   By: Anner Crete M.D.   On: 10/05/2021 20:50   ECHOCARDIOGRAM COMPLETE  Result Date: 10/06/2021    ECHOCARDIOGRAM REPORT   Patient Name:   Peter Beltran Date of Exam: 10/06/2021 Medical Rec #:  [id number]           Height:       68.0 in Accession #:    [id number]          Weight:       220.0 lb Date of Birth:  1925-09-05            BSA:          2.128 m Patient Age:    60 years            BP:           114/48 mmHg Patient Gender: M                   HR:           74 bpm. Exam Location:  Inpatient Procedure: 2D Echo, Cardiac Doppler, Color Doppler and Intracardiac            Opacification Agent Indications:    Elevated troponin  History:        Patient has prior history of Echocardiogram examinations, most                 recent 01/05/2021. CAD, Signs/Symptoms:Syncope; Risk                 Factors:Dyslipidemia.  Sonographer:    Glo Herring Referring Phys: [id number] [place]  1. Left ventricular ejection fraction, by estimation, is 60 to 65%. The left ventricle has normal function. The left ventricle has no regional wall motion abnormalities. There is mild left ventricular hypertrophy. Left ventricular diastolic parameters are consistent with Grade II diastolic dysfunction (pseudonormalization).  2. Right ventricular systolic function is mildly reduced. The right ventricular size is mildly enlarged.  3. Left atrial size was mild to moderately dilated.  4. Right atrial size was moderately dilated.  5. Moderate mitral valve regurgitation, ERO 0.3 cm^2. Mild to moderate mitral stenosis. The mean mitral valve gradient is 6.0 mmHg with MVA 1.68 cm^2 by VTI. Moderate mitral annular calcification.  6. Mechanical aortic valve. Mean gradient 16 mmHg with EOA 1.5 cm^2, DI 0.44. No aortic insufficiency noted.  7. Aortic dilatation noted. There is mild dilatation of the ascending aorta, measuring 38 mm.  8. The inferior vena cava is dilated in size with  <50% respiratory variability, suggesting right atrial pressure of 15 mmHg. FINDINGS  Left Ventricle: Left ventricular ejection fraction, by estimation, is 60 to 65%. The left ventricle has normal function. The left ventricle has no regional wall motion abnormalities. The left ventricular internal cavity size was normal in size.  There is  mild left ventricular hypertrophy. Left ventricular diastolic parameters are consistent with Grade II diastolic dysfunction (pseudonormalization). Right Ventricle: The right ventricular size is mildly enlarged. No increase in right ventricular wall thickness. Right ventricular systolic function is mildly reduced. Left Atrium: Left atrial size was mild to moderately dilated. Right Atrium: Right atrial size was moderately dilated. Pericardium: There is no evidence of pericardial effusion. Mitral Valve: The mitral valve is degenerative in appearance. There is moderate calcification of the mitral valve leaflet(s). Moderate mitral annular calcification. Moderate mitral valve regurgitation. Mild to moderate mitral valve stenosis. MV peak gradient, 11.7 mmHg. The mean mitral valve gradient is 6.0 mmHg. Tricuspid Valve: The tricuspid valve is normal in structure. Tricuspid valve regurgitation is mild. Aortic Valve: Mechanical aortic valve. Mean gradient 16 mmHg with EOA 1.5 cm^2, DI 0.44. No aortic insufficiency noted. The aortic valve was not well visualized. Aortic valve regurgitation is not visualized. Aortic valve mean gradient measures 15.0 mmHg.  Aortic valve peak gradient measures 27.7 mmHg. Aortic valve area, by VTI measures 1.41 cm. Pulmonic Valve: The pulmonic valve was normal in structure. Pulmonic valve regurgitation is trivial. Aorta: The aortic root is normal in size and structure and aortic dilatation noted. There is mild dilatation of the ascending aorta, measuring 38 mm. Venous: The inferior vena cava is dilated in size with less than 50% respiratory variability,  suggesting right atrial pressure of 15 mmHg. IAS/Shunts: No atrial level shunt detected by color flow Doppler.  LEFT VENTRICLE PLAX 2D LVIDd:         5.70 cm   Diastology LVIDs:         3.90 cm   LV e' medial:    3.48 cm/s LV PW:         1.40 cm   LV E/e' medial:  52.6 LV IVS:        1.40 cm   LV e' lateral:   3.92 cm/s LVOT diam:     2.00 cm   LV E/e' lateral: 46.7 LV SV:         76 LV SV Index:   36 LVOT Area:     3.14 cm  RIGHT VENTRICLE            IVC RV Basal diam:  4.00 cm    IVC diam: 2.50 cm RV S prime:     6.64 cm/s LEFT ATRIUM             Index        RIGHT ATRIUM           Index LA diam:        5.60 cm 2.63 cm/m   RA Area:     26.20 cm LA Vol (A2C):   83.8 ml 39.38 ml/m  RA Volume:   79.60 ml  37.40 ml/m LA Vol (A4C):   85.0 ml 39.94 ml/m LA Biplane Vol: 84.4 ml 39.66 ml/m  AORTIC VALVE                     PULMONIC VALVE AV Area (Vmax):    1.52 cm      PV Vmax:       0.91 m/s AV Area (Vmean):   1.42 cm      PV Peak grad:  3.3 mmHg AV Area (VTI):     1.41 cm AV Vmax:           263.33 cm/s AV Vmean:  184.333 cm/s AV VTI:            0.541 m AV Peak Grad:      27.7 mmHg AV Mean Grad:      15.0 mmHg LVOT Vmax:         127.50 cm/s LVOT Vmean:        83.350 cm/s LVOT VTI:          0.242 m LVOT/AV VTI ratio: 0.45  AORTA Ao Root diam: 3.30 cm Ao Asc diam:  3.80 cm MITRAL VALVE                  TRICUSPID VALVE MV Area (PHT): 3.45 cm       TR Peak grad:   33.4 mmHg MV Area VTI:   1.62 cm       TR Vmax:        289.00 cm/s MV Peak grad:  11.7 mmHg MV Mean grad:  6.0 mmHg       SHUNTS MV Vmax:       1.71 m/s       Systemic VTI:  0.24 m MV Vmean:      113.3 cm/s     Systemic Diam: 2.00 cm MV Decel Time: 220 msec MR Peak grad:    92.5 mmHg MR Mean grad:    63.0 mmHg MR Vmax:         481.00 cm/s MR Vmean:        382.0 cm/s MR PISA:         4.02 cm MR PISA Eff ROA: 30 mm MR PISA Radius:  0.80 cm MV E velocity: 183.00 cm/s MV A velocity: 139.00 cm/s MV E/A ratio:  1.32 Dalton McleanMD Electronically  signed by Franki Monte Signature Date/Time: 10/06/2021/12:22:54 PM    Final    CT Angio Chest/Abd/Pel for Dissection W and/or Wo Contrast  Result Date: 10/05/2021 CLINICAL DATA:  Nausea and vomiting, evaluate for possible mesenteric ischemia. EXAM: CT ANGIOGRAPHY CHEST, ABDOMEN AND PELVIS TECHNIQUE: Non-contrast CT of the chest was initially obtained. Multidetector CT imaging through the chest, abdomen and pelvis was performed using the standard protocol during bolus administration of intravenous contrast. Multiplanar reconstructed images and MIPs were obtained and reviewed to evaluate the vascular anatomy. CONTRAST:  150mL OMNIPAQUE IOHEXOL 350 MG/ML SOLN COMPARISON:  None. FINDINGS: CTA CHEST FINDINGS Cardiovascular: Initial precontrast images demonstrate atherosclerotic calcification as well as findings of prior coronary bypass grafting. No aneurysmal dilatation is seen. No hyperdense crescent to suggest aortic injury is noted. Post-contrast images show no evidence of dissection. The pulmonary artery shows a normal branching pattern. No evidence of pulmonary emboli are seen. Coronary calcifications and changes of prior coronary bypass are noted. No cardiac enlargement is seen. Aortic valve replacement is noted. No pericardial effusion is noted. Mediastinum/Nodes: Thoracic inlet is within normal limits. No sizable hilar or mediastinal adenopathy is noted. The esophagus as visualized is within normal limits. Lungs/Pleura: Lungs are well aerated bilaterally. No focal infiltrate or sizable effusion is seen. No sizable parenchymal nodules are noted. Mild scarring is noted in the lower lobes bilaterally. Musculoskeletal: Degenerative changes of the thoracic spine are noted. No rib fractures are seen. No compression deformities are noted. Review of the MIP images confirms the above findings. CTA ABDOMEN AND PELVIS FINDINGS VASCULAR Aorta: Abdominal aorta demonstrates diffuse atherosclerotic calcifications. No  aneurysmal dilatation or dissection is noted. Celiac: Atherosclerotic calcifications of the origin of the celiac axis are seen although no focal stenosis is noted. SMA: Atherosclerotic  calcification is noted without focal stenosis. Renals: Dual renal arteries are noted on the right with single renal artery on the left. No stenotic changes are seen. IMA: Patent without evidence of aneurysm, dissection, vasculitis or significant stenosis. Inflow: Iliac arteries demonstrate atherosclerotic calcification without aneurysmal dilatation or dissection. Veins: No specific venous abnormality is noted. Review of the MIP images confirms the above findings. NON-VASCULAR Hepatobiliary: No focal liver abnormality is seen. No gallstones, gallbladder wall thickening, or biliary dilatation. Pancreas: Unremarkable. No pancreatic ductal dilatation or surrounding inflammatory changes. Spleen: Normal in size without focal abnormality. Adrenals/Urinary Tract: Adrenal glands are within normal limits bilaterally. Normal enhancement is noted in the kidneys bilaterally. No renal calculi or obstructive changes are seen. A tiny cyst is noted within the left kidney measuring less than 1 cm. No obstructive changes are seen. The ureters are unremarkable. The bladder is well distended. Stomach/Bowel: Scattered diverticular change of the colon is noted without evidence of diverticulitis. No obstructive changes of the colon are noted. The appendix is not well visualized. No inflammatory changes to suggest appendicitis are noted. The small bowel and stomach are unremarkable. Lymphatic: No sizable lymphadenopathy is noted. Reproductive: Prostate is diminutive likely related to the prior ablation Other: No abdominal wall hernia or abnormality. No abdominopelvic ascites. Musculoskeletal: Degenerative changes of lumbar spine are noted. No acute bony abnormality is seen. Degenerative changes of the sacroiliac joints are noted as well. Review of the MIP  images confirms the above findings. IMPRESSION: No evidence of mesenteric ischemia. Diverticulosis without diverticulitis. No acute abnormality noted. Aortic Atherosclerosis (ICD10-I70.0). Electronically Signed   By: Inez Catalina M.D.   On: 10/05/2021 22:23   US Abdomen Limited RUQ (LIVER/GB)  Result Date: 10/06/2021 CLINICAL DATA:  Elevated LFTs. EXAM: ULTRASOUND ABDOMEN LIMITED RIGHT UPPER QUADRANT COMPARISON:  10/05/2021. FINDINGS: Gallbladder: Sludge is noted in the gallbladder. No gallstones or wall thickening visualized. No sonographic Murphy sign noted by sonographer. Common bile duct: Diameter: 6.3 mm, within normal limits for patient's stated age. Liver: No focal lesion identified. Within normal limits in parenchymal echogenicity. Portal vein is patent on color Doppler imaging with normal direction of blood flow towards the liver. Other: No free fluid. IMPRESSION: Gallbladder sludge without evidence of acute cholecystitis. Electronically Signed   By: Brett Fairy M.D.   On: 10/06/2021 04:48    Microbiology: Recent Results (from the past 240 hour(s))  Resp Panel by RT-PCR (Flu A&B, Covid) Nasopharyngeal Swab     Status: Abnormal   Collection Time: 10/05/21  7:03 PM   Specimen: Nasopharyngeal Swab; Nasopharyngeal(NP) swabs in vial transport medium  Result Value Ref Range Status   SARS Coronavirus 2 by RT PCR POSITIVE (A) NEGATIVE Final    Comment: RESULT CALLED TO, READ BACK BY AND VERIFIED WITH: RN SARAH N. 10/05/21@21 :17 BY TW (NOTE) SARS-CoV-2 target nucleic acids are DETECTED.  The SARS-CoV-2 RNA is generally detectable in upper respiratory specimens during the acute phase of infection. Positive results are indicative of the presence of the identified virus, but do not rule out bacterial infection or co-infection with other pathogens not detected by the test. Clinical correlation with patient history and other diagnostic information is necessary to determine patient infection  status. The expected result is Negative.  Fact Sheet for Patients: EntrepreneurPulse.com.au  Fact Sheet for Healthcare Providers: IncredibleEmployment.be  This test is not yet approved or cleared by the [place] FDA and  has been authorized for detection and/or diagnosis of SARS-CoV-2 by FDA under an Emergency Use Authorization (EUA).  This EUA will remain in effect (meaning this test can be  used) for the duration of  the COVID-19 declaration under Section 564(b)(1) of the Act, 21 U.S.C. section 360bbb-3(b)(1), unless the authorization is terminated or revoked sooner.     Influenza A by PCR NEGATIVE NEGATIVE Final   Influenza B by PCR NEGATIVE NEGATIVE Final    Comment: (NOTE) The Xpert Xpress SARS-CoV-2/FLU/RSV plus assay is intended as an aid in the diagnosis of influenza from Nasopharyngeal swab specimens and should not be used as a sole basis for treatment. Nasal washings and aspirates are unacceptable for Xpert Xpress SARS-CoV-2/FLU/RSV testing.  Fact Sheet for Patients: EntrepreneurPulse.com.au  Fact Sheet for Healthcare Providers: [email or web address]  This test is not yet approved or cleared by the [place] FDA and has been authorized for detection and/or diagnosis of SARS-CoV-2 by FDA under an Emergency Use Authorization (EUA). This EUA will remain in effect (meaning this test can be used) for the duration of the COVID-19 declaration under Section 564(b)(1) of the Act, 21 U.S.C. section 360bbb-3(b)(1), unless the authorization is terminated or revoked.  Performed at [facility], [place] [address] [place], [place] [address]   Urine Culture     Status: None   Collection Time: 10/05/21  7:03 PM   Specimen: In/Out Cath Urine  Result Value Ref Range Status   Specimen Description IN/OUT CATH URINE  Final   Special Requests NONE  Final   Culture   Final    NO GROWTH Performed  at [facility], [place] [address] [place], [place] [address]    Report Status 10/06/2021 FINAL  Final  Blood Culture (routine x 2)     Status: Abnormal   Collection Time: 10/05/21  7:08 PM   Specimen: BLOOD RIGHT HAND  Result Value Ref Range Status   Specimen Description BLOOD RIGHT HAND  Final   Special Requests   Final    BOTTLES DRAWN AEROBIC AND ANAEROBIC Blood Culture adequate volume   Culture  Setup Time   Final    GRAM NEGATIVE RODS IN BOTH AEROBIC AND ANAEROBIC BOTTLES CRITICAL VALUE NOTED.  VALUE IS CONSISTENT WITH PREVIOUSLY REPORTED AND CALLED VALUE.    Culture (A)  Final    ESCHERICHIA COLI SUSCEPTIBILITIES PERFORMED ON PREVIOUS CULTURE WITHIN THE LAST 5 DAYS. Performed at [facility], [place] [address], [place], [place] [address]    Report Status 10/08/2021 FINAL  Final  Blood Culture (routine x 2)     Status: Abnormal   Collection Time: 10/05/21  7:22 PM   Specimen: BLOOD LEFT HAND  Result Value Ref Range Status   Specimen Description BLOOD LEFT HAND  Final   Special Requests   Final    BOTTLES DRAWN AEROBIC AND ANAEROBIC Blood Culture adequate volume   Culture  Setup Time   Final    GRAM NEGATIVE RODS IN BOTH AEROBIC AND ANAEROBIC BOTTLES CRITICAL RESULT CALLED TO, READ BACK BY AND VERIFIED WITH: PHARMD B.BOLDEN AT 1110 ON 10/06/2021 BY T.SAAD. Performed at [facility], [place] [address], [place], [place] [address]    Culture ESCHERICHIA COLI (A)  Final   Report Status 10/08/2021 FINAL  Final   Organism ID, Bacteria ESCHERICHIA COLI  Final      Susceptibility   Escherichia coli - MIC*    AMPICILLIN 4 SENSITIVE Sensitive     CEFAZOLIN <=4 SENSITIVE Sensitive     CEFEPIME <=0.12 SENSITIVE Sensitive     CEFTAZIDIME <=1 SENSITIVE Sensitive  CEFTRIAXONE <=0.25 SENSITIVE Sensitive     CIPROFLOXACIN <=0.25 SENSITIVE Sensitive     GENTAMICIN <=1 SENSITIVE Sensitive     IMIPENEM <=0.25 SENSITIVE Sensitive     TRIMETH/SULFA <=20 SENSITIVE Sensitive      AMPICILLIN/SULBACTAM <=2 SENSITIVE Sensitive     PIP/TAZO <=4 SENSITIVE Sensitive     * ESCHERICHIA COLI  Blood Culture ID Panel (Reflexed)     Status: Abnormal   Collection Time: 10/05/21  7:22 PM  Result Value Ref Range Status   Enterococcus faecalis NOT DETECTED NOT DETECTED Final   Enterococcus Faecium NOT DETECTED NOT DETECTED Final   Listeria monocytogenes NOT DETECTED NOT DETECTED Final   Staphylococcus species NOT DETECTED NOT DETECTED Final   Staphylococcus aureus (BCID) NOT DETECTED NOT DETECTED Final   Staphylococcus epidermidis NOT DETECTED NOT DETECTED Final   Staphylococcus lugdunensis NOT DETECTED NOT DETECTED Final   Streptococcus species NOT DETECTED NOT DETECTED Final   Streptococcus agalactiae NOT DETECTED NOT DETECTED Final   Streptococcus pneumoniae NOT DETECTED NOT DETECTED Final   Streptococcus pyogenes NOT DETECTED NOT DETECTED Final   A.calcoaceticus-baumannii NOT DETECTED NOT DETECTED Final   Bacteroides fragilis NOT DETECTED NOT DETECTED Final   Enterobacterales DETECTED (A) NOT DETECTED Final    Comment: Enterobacterales represent a large order of gram negative bacteria, not a single organism. CRITICAL RESULT CALLED TO, READ BACK BY AND VERIFIED WITH: PHARMD B.BOLDEN AT 1110 ON 10/06/2021 BY T.SAAD.    Enterobacter cloacae complex NOT DETECTED NOT DETECTED Final   Escherichia coli DETECTED (A) NOT DETECTED Final    Comment: CRITICAL RESULT CALLED TO, READ BACK BY AND VERIFIED WITH: PHARMD B.BOLDEN AT 1110 ON 10/06/2021 BY T.SAAD.    Klebsiella aerogenes NOT DETECTED NOT DETECTED Final   Klebsiella oxytoca NOT DETECTED NOT DETECTED Final   Klebsiella pneumoniae NOT DETECTED NOT DETECTED Final   Proteus species NOT DETECTED NOT DETECTED Final   Salmonella species NOT DETECTED NOT DETECTED Final   Serratia marcescens NOT DETECTED NOT DETECTED Final   Haemophilus influenzae NOT DETECTED NOT DETECTED Final   Neisseria meningitidis NOT DETECTED NOT  DETECTED Final   Pseudomonas aeruginosa NOT DETECTED NOT DETECTED Final   Stenotrophomonas maltophilia NOT DETECTED NOT DETECTED Final   Candida albicans NOT DETECTED NOT DETECTED Final   Candida auris NOT DETECTED NOT DETECTED Final   Candida glabrata NOT DETECTED NOT DETECTED Final   Candida krusei NOT DETECTED NOT DETECTED Final   Candida parapsilosis NOT DETECTED NOT DETECTED Final   Candida tropicalis NOT DETECTED NOT DETECTED Final   Cryptococcus neoformans/gattii NOT DETECTED NOT DETECTED Final   CTX-M ESBL NOT DETECTED NOT DETECTED Final   Carbapenem resistance IMP NOT DETECTED NOT DETECTED Final   Carbapenem resistance KPC NOT DETECTED NOT DETECTED Final   Carbapenem resistance NDM NOT DETECTED NOT DETECTED Final   Carbapenem resist OXA 48 LIKE NOT DETECTED NOT DETECTED Final   Carbapenem resistance VIM NOT DETECTED NOT DETECTED Final    Comment: Performed at [facility], [address], [place], [place] [address]     Labs: Basic Metabolic Panel: Recent Labs  Lab 10/05/21 1903 10/05/21 2225 10/06/21 0413 10/07/21 0228  NA 135  --  133* 132*  K 3.1*  --  4.2 3.7  CL 99  --  101 104  CO2 23  --  21* 19*  GLUCOSE 143*  --  168* 135*  BUN 21  --  25* 22  CREATININE 1.21  --  1.49* 1.11  CALCIUM 9.6  --  8.8* 9.0  MG  --  1.4* 1.9  --    Liver Function Tests: Recent Labs  Lab 10/05/21 1903 10/06/21 0413 10/07/21 0228  AST 146* 124* 105*  ALT 70* 65* 62*  ALKPHOS 47 40 57  BILITOT 4.3* 4.6* 5.0*  PROT 6.8 6.0* 6.3*  ALBUMIN 3.4* 2.8* 2.9*   No results for input(s): LIPASE, AMYLASE in the last 168 hours. Recent Labs  Lab 10/06/21 0431  AMMONIA 18   CBC: Recent Labs  Lab 10/05/21 1903 10/06/21 0413 10/07/21 0228  WBC 11.6* 15.2* 10.4  NEUTROABS 10.6*  --   --   HGB 12.3* 10.2* 10.3*  HCT 35.7* 30.4* 30.5*  MCV 94.9 95.9 94.4  PLT 162 130* 117*   Cardiac Enzymes: No results for input(s): CKTOTAL, CKMB, CKMBINDEX, TROPONINI in the last 168  hours. BNP: BNP (last 3 results) Recent Labs    10/05/21 2224  BNP 427.9*    ProBNP (last 3 results) No results for input(s): PROBNP in the last 8760 hours.  CBG: No results for input(s): GLUCAP in the last 168 hours.     Signed:  Nita Sells MD   [facility] 10/08/2021, 8:41 AM

## 2021-10-14 ENCOUNTER — Ambulatory Visit (INDEPENDENT_AMBULATORY_CARE_PROVIDER_SITE_OTHER): Payer: Medicare Other | Admitting: Cardiology

## 2021-10-14 ENCOUNTER — Other Ambulatory Visit: Payer: Self-pay

## 2021-10-14 ENCOUNTER — Ambulatory Visit: Payer: Medicare Other | Admitting: Cardiology

## 2021-10-14 ENCOUNTER — Encounter: Payer: Self-pay | Admitting: Cardiology

## 2021-10-14 VITALS — BP 163/62 | HR 94 | Ht 68.0 in | Wt 195.0 lb

## 2021-10-14 DIAGNOSIS — I10 Essential (primary) hypertension: Secondary | ICD-10-CM | POA: Diagnosis not present

## 2021-10-14 DIAGNOSIS — E782 Mixed hyperlipidemia: Secondary | ICD-10-CM

## 2021-10-14 DIAGNOSIS — E1140 Type 2 diabetes mellitus with diabetic neuropathy, unspecified: Secondary | ICD-10-CM

## 2021-10-14 DIAGNOSIS — Z954 Presence of other heart-valve replacement: Secondary | ICD-10-CM

## 2021-10-14 DIAGNOSIS — I2510 Atherosclerotic heart disease of native coronary artery without angina pectoris: Secondary | ICD-10-CM | POA: Diagnosis not present

## 2021-10-14 NOTE — Patient Instructions (Signed)
Medication Instructions:  °Your physician recommends that you continue on your current medications as directed. Please refer to the Current Medication list given to you today.  °*If you need a refill on your cardiac medications before your next appointment, please call your pharmacy* ° ° °Lab Work: °None ordered °If you have labs (blood work) drawn today and your tests are completely normal, you will receive your results only by: °MyChart Message (if you have MyChart) OR °A paper copy in the mail °If you have any lab test that is abnormal or we need to change your treatment, we will call you to review the results. ° ° °Testing/Procedures: °None ordered ° ° °Follow-Up: °At [facility], you and your health needs are our priority.  As part of our continuing mission to provide you with exceptional heart care, we have created designated Provider Care Teams.  These Care Teams include your primary Cardiologist (physician) and Advanced Practice Providers (APPs -  Physician Assistants and Nurse Practitioners) who all work together to provide you with the care you need, when you need it. ° °We recommend signing up for the patient portal called "MyChart".  Sign up information is provided on this After Visit Summary.  MyChart is used to connect with patients for Virtual Visits (Telemedicine).  Patients are able to view lab/test results, encounter notes, upcoming appointments, etc.  Non-urgent messages can be sent to your provider as well.   °To learn more about what you can do with MyChart, go to [email or web address].   ° °Your next appointment:   °2 month(s) ° °The format for your next appointment:   °In Person ° °Provider:   °Rajan Revankar, MD ° ° °Other Instructions °NA  °

## 2021-10-14 NOTE — Progress Notes (Signed)
Cardiology Office Note:    Date:  10/14/2021   ID:  Peter Beltran, DOB 1925/11/13, MRN [id number]  PCP:  Peter Glazier, MD  Cardiologist:  Peter Lindau, MD   Referring MD: Peter Glazier, MD    ASSESSMENT:    1. Atherosclerosis of native coronary artery of native heart without angina pectoris   2. Essential hypertension   3. Mixed hyperlipidemia   4. Type 2 diabetes mellitus with diabetic neuropathy, without long-term current use of insulin (HCC)    PLAN:    In order of problems listed above:  Coronary artery disease: Secondary prevention stressed with patient.  Importance of compliance with diet medication stressed any vocalized understanding. Essential hypertension: Blood pressure stable and diet was emphasized.  I would not like to be too aggressive with blood pressure control in view of recent events. Mixed dyslipidemia: Diet was emphasized.  Not on statin therapy because of elevated lipids. Elevated LFTs: Managed by primary care. Post aortic valve replacement with metallic valve: Patient managed by primary care.  Anticoagulation is also managed by primary care. Mild to moderate mitral stenosis and moderate mitral regurgitation: Stable at this time medical management. Patient will be seen in follow-up appointment in 2 months or earlier if the patient has any concerns   Medication Adjustments/Labs and Tests Ordered: Current medicines are reviewed at length with the patient today.  Concerns regarding medicines are outlined above.  No orders of the defined types were placed in this encounter.  No orders of the defined types were placed in this encounter.    No chief complaint on file.    History of Present Illness:    Peter Beltran is a 87 y.o. male.  Patient has past medical history of coronary artery disease, essential hypertension and dyslipidemia.  He was admitted to the hospital recently and recovering.  He has had the COVID.  He has elevated  LFTs.  He denies any problems at this time he just tells me that he is trying to get his energy back.  No chest pain orthopnea or PND.  At the time of my evaluation, the patient is alert awake oriented and in no distress.  Past Medical History:  Diagnosis Date   Acute kidney failure, unspecified ([place]) 07/22/2019   Acute maxillary sinusitis, unspecified 08/16/2017   Allergic rhinitis, unspecified 02/04/2018   Anxiety disorder, unspecified 09/17/2013   Aortic stenosis    post aortic valve replacement with St.Jude's valve by Peter Beltran on 07-27-11   Aortic valve disorder 05/29/2013   Atherosclerotic heart disease of native coronary artery without angina pectoris 08/16/2017   Benign prostatic hyperplasia with lower urinary tract symptoms 08/16/2017   Bradycardia with less than 60 beats per minute 04/29/2021   Candidal stomatitis 08/16/2017   Cellulitis, unspecified 07/25/2019   Chronic airway obstruction ([place]) 04/21/2013   Chronic coronary artery disease 04/10/2016   Formatting of this note might be different from the original. Overview:  status post CABG x3 in 2008 Formatting of this note might be different from the original. Overview:  Overview:  status post CABG x3 in 2008   Chronic rhinitis 12/27/2019   Last Assessment & Plan:  Concern over his nose. Chronic recurring nasal congestion.  Had a severe episode recently that responded to over-the-counter medication.  Currently asymptomatic.  Wants to make sure all is okay. EXAM intranasally shows no polyps, purulence, masses or obstructing anatomy.  Mucosa is overall healthy. PLAN: Reassured all looks okay.  I think what he  is doing is fine.  Happy t   Cognitive communication deficit 10/28/2020   Constipation, unspecified 08/16/2017   Coronary artery disease    status post CABG x3 in 2008   Diabetic polyneuropathy associated with type 2 diabetes mellitus ([place]) 10/04/2016   Disorder of the skin and subcutaneous tissue, unspecified 07/25/2019    Disorders of muscle in diseases classified elsewhere, multiple sites 10/28/2020   Disturbance of salivary secretion 04/21/2013   Dry mouth, unspecified 10/18/2018   Dyslipidemia    Dysphagia 04/21/2013   Encounter for current long-term use of anticoagulants 05/29/2013   Formatting of this note might be different from the original. IMO routine update Formatting of this note might be different from the original. Overview:  IMO routine update   Enlarged prostate with lower urinary tract symptoms (LUTS) 05/29/2013   Esophageal reflux 02/26/2014   Essential hypertension 01/19/2019   Fall 04/30/2021   Foreign body aspiration 03/05/2016   Gastro-esophageal reflux disease without esophagitis 02/26/2014   Generalized edema 10/18/2018   Gout    Hidrocystoma of left eyelid 12/10/2014   History of falling 10/28/2020   Hyperlipidemia, unspecified 11/11/2016   Hyperplasia of prostate    benign   Hypersomnia 06/14/2013   Hypertension    Hypertensive heart disease with heart failure ([place]) 10/18/2018   Hypertriglyceridemia 11/11/2016   Hypothyroidism    Hypothyroidism, unspecified 04/10/2016   Impaired fasting glucose 04/21/2013   Iron deficiency anemia, unspecified 08/16/2017   Ischemic optic neuropathy of both eyes 01/28/2016   Localized edema 07/25/2019   Long term (current) use of anticoagulants 05/29/2013   Overview:  IMO routine update IMO routine update   Long term (current) use of aspirin 08/16/2017   Low back pain 08/16/2017   Low-tension glaucoma of both eyes, 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/2016   tmax 12/14 OCT 11/03/2016 VF 12/04/2015 Gonio 05/07/2015 tmax 12/14 OCT 11/03/2016 VF 12/04/2015 Gonio 05/07/2015   Lumbar stenosis 04/30/2015   Memory loss, short term 03/30/2016   Mild cognitive impairment 08/30/2016   Mixed dyslipidemia 01/19/2019   Moderate persistent asthma without complication 12/87/8676   Moderate persistent asthma, uncomplicated 72/07/4708   Monitoring for long-term  anticoagulant use 08/03/2017   Muscle weakness (generalized) 10/28/2020   Myocardial infarct (HCC)    Nasal congestion 02/04/2018   Neuropathy 04/04/2014   Obstructive sleep apnea 12/17/2013   Obstructive sleep apnea of adult 12/17/2013   Old myocardial infarction 08/22/2019   Oral mucositis (ulcerative), unspecified 10/28/2020   Osteoarthrosis 04/21/2013   Other malaise 07/25/2019   Other specified disorders of nose and nasal sinuses 10/28/2020   Overweight 01/19/2019   Pain in thoracic spine 10/18/2018   Periodic limb movement disorder 04/30/2021   Pruritus, unspecified 11/10/2020   Pseudophakia of both eyes 12/10/2014   Psoriasis and similar disorder 01/28/2014   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 with seizure like activity.   Traumatic subarachnoid hemorrhage without loss of consciousness, subsequent encounter 10/28/2020   Type 2 diabetes mellitus with diabetic neuropathy, unspecified ([place]) 08/16/2017   Unspecified abnormalities of gait and mobility 10/18/2018   Unspecified jaundice 11/10/2020   Unspecified osteoarthritis, unspecified site 04/21/2013   Unspecified urinary incontinence 05/17/2018   Urinary retention  07/18/2018   Vitamin D deficiency, unspecified 08/16/2017    Past Surgical History:  Procedure Laterality Date   AORTIC VALVE REPLACEMENT     CORONARY ARTERY BYPASS GRAFT     PROSTATE ABLATION     TONSILLECTOMY      Current Medications: Current Meds  Medication Sig   acetaminophen (TYLENOL) 650 MG CR tablet Take 650 mg by mouth every 4 (four) hours as needed for pain.   albuterol (PROVENTIL  HFA;VENTOLIN HFA) 108 (90 Base) MCG/ACT inhaler Inhale 2 puffs into the lungs every 6 (six) hours as needed for wheezing or shortness of breath.   cefdinir (OMNICEF) 300 MG capsule Take 1 capsule (300 mg total) by mouth every 12 (twelve) hours for 10 days.   Docusate Sodium (DSS) 100 MG CAPS Take 100 mg by mouth daily as needed for constipation.   ergocalciferol (VITAMIN D2) 1.25 MG (50000 UT) capsule Take 50,000 Units by mouth every Tuesday.   fluticasone (FLONASE) 50 MCG/ACT nasal spray Place 1 spray into both nostrils in the morning and at bedtime.   Fluticasone-Salmeterol (ADVAIR) 100-50 MCG/DOSE AEPB Inhale 2 puffs into the lungs 2 (two) times daily.   levothyroxine (SYNTHROID) 112 MCG tablet Take 112 mcg by mouth as directed. 6 days a week   nitroGLYCERIN (NITROSTAT) 0.4 MG SL tablet Place 0.4 mg under the tongue every 5 (five) minutes as needed for chest pain.   omeprazole (PRILOSEC) 20 MG capsule Take 20 mg by mouth 2 (two) times daily before a meal.   rOPINIRole (REQUIP) 0.5 MG tablet Take 0.5 mg by mouth daily.   rOPINIRole (REQUIP) 1 MG tablet Take 1 mg by mouth daily.   SLOW FE 142 (45 Fe) MG TBCR Take 45 mg by mouth daily.   spironolactone (ALDACTONE) 25 MG tablet Take 25 mg by mouth daily.   warfarin (COUMADIN) 3 MG tablet Take 3 mg by mouth See admin instructions. Take 3mg  on Wednesdays, Saturdays and Sundays,   warfarin (COUMADIN) 5 MG tablet Take 5 mg by mouth See admin instructions. Take 5mg  on Monday, Tuesdays, Thursdays and Fridays.     Allergies:   Atorvastatin calcium, Dorzolamide, Brimonidine, Brinzolamide-brimonidine, Lipitor [atorvastatin calcium], Rotigotine, Tamsulosin, Zolpidem, Dutasteride, and Vancomycin   Social History   Socioeconomic History   Marital status: Married    Spouse name: Not on file   Number of children: Not on file   Years of education: Not on file   Highest education level: Not on file  Occupational History   Not on file  Tobacco Use    Smoking status: Former   Smokeless tobacco: Never  Substance and Sexual Activity   Alcohol use: No   Drug use: No   Sexual activity: Not on file  Other Topics Concern   Not on file  Social History Narrative   Not on file   Social Determinants of Health   Financial Resource Strain: Not on file  Food Insecurity: Not on file  Transportation Needs: Not on file  Physical Activity: Not on file  Stress: Not on file  Social Connections: Not on file     Family History: The patient's family history is negative for Hypertension, Diabetes, Cancer, and Heart disease.  ROS:   Please see the history of present illness.    All other systems reviewed and are negative.  EKGs/Labs/Other Studies Reviewed:    The following studies were reviewed today: I reviewed records from the hospital.  IMPRESSIONS     1. Left ventricular ejection fraction, by  estimation, is 60 to 65%. The  left ventricle has normal function. The left ventricle has no regional  wall motion abnormalities. There is mild left ventricular hypertrophy.  Left ventricular diastolic parameters  are consistent with Grade II diastolic dysfunction (pseudonormalization).   2. Right ventricular systolic function is mildly reduced. The right  ventricular size is mildly enlarged.   3. Left atrial size was mild to moderately dilated.   4. Right atrial size was moderately dilated.   5. Moderate mitral valve regurgitation, ERO 0.3 cm^2. Mild to moderate  mitral stenosis. The mean mitral valve gradient is 6.0 mmHg with MVA 1.68  cm^2 by VTI. Moderate mitral annular calcification.   6. Mechanical aortic valve. Mean gradient 16 mmHg with EOA 1.5 cm^2, DI  0.44. No aortic insufficiency noted.   7. Aortic dilatation noted. There is mild dilatation of the ascending  aorta, measuring 38 mm.   8. The inferior vena cava is dilated in size with <50% respiratory  variability, suggesting right atrial pressure of 15 mmHg.    Recent  Labs: 10/05/2021: B Natriuretic Peptide 427.9 10/06/2021: Magnesium 1.9; TSH 0.247 10/07/2021: ALT 62; BUN 22; Creatinine, Ser 1.11; Hemoglobin 10.3; Platelets 117; Potassium 3.7; Sodium 132  Recent Lipid Panel    Component Value Date/Time   CHOL 242 (H) 04/15/2021 1614   TRIG 782 (HH) 04/15/2021 1614   HDL 19 (L) 04/15/2021 1614   CHOLHDL 12.7 (H) 04/15/2021 1614   CHOLHDL 8.5 04/11/2016 0300   VLDL UNABLE TO CALCULATE IF TRIGLYCERIDE OVER 400 mg/dL 04/11/2016 0300   LDLCALC 91 04/15/2021 1614    Physical Exam:    VS:  BP (!) 163/62   Pulse 94   Ht 5\' 8"  (1.727 m)   Wt 195 lb (88.5 kg)   SpO2 98%   BMI 29.65 kg/m     Wt Readings from Last 3 Encounters:  10/14/21 195 lb (88.5 kg)  10/05/21 220 lb (99.8 kg)  06/08/21 206 lb 1.3 oz (93.5 kg)     GEN: Patient is in no acute distress HEENT: Normal NECK: No JVD; No carotid bruits LYMPHATICS: No lymphadenopathy CARDIAC: Hear sounds regular, 2/6 systolic murmur at the apex. RESPIRATORY:  Clear to auscultation without rales, wheezing or rhonchi  ABDOMEN: Soft, non-tender, non-distended MUSCULOSKELETAL:  No edema; No deformity  SKIN: Warm and dry NEUROLOGIC:  Alert and oriented x 3 PSYCHIATRIC:  Normal affect   Signed, Peter Lindau, MD  10/14/2021 2:34 PM    [place]

## 2021-10-22 DIAGNOSIS — R112 Nausea with vomiting, unspecified: Secondary | ICD-10-CM

## 2021-10-22 HISTORY — DX: Nausea with vomiting, unspecified: R11.2

## 2021-11-24 ENCOUNTER — Ambulatory Visit: Payer: Medicare Other | Admitting: Cardiology

## 2022-01-18 ENCOUNTER — Telehealth: Payer: Self-pay

## 2022-01-18 NOTE — Telephone Encounter (Signed)
? ?  Pre-operative Risk Assessment  ?  ?Patient Name: Peter Beltran  ?DOB: 02/02/1925 ?MRN: [id number]  ? ?  ? ?Request for Surgical Clearance   ? ?Procedure:   ERCP ? ?Date of Surgery:  Clearance TBD                              ?   ?Surgeon:  Dr. Rolm Bookbinder ?Surgeon's Group or Practice Name:  [facility] ?Phone number:  [phone] ?Fax number:  [phone] ?  ?Type of Clearance Requested:   ?- Pharmacy:  Hold Warfarin (Coumadin) 8-5 days prior to procedure ?  ?Type of Anesthesia:  Not Indicated ?  ?Additional requests/questions:   ? ?Signed, ?Isaiah Torok Tressa Busman   ?01/18/2022, 3:12 PM  ? ?

## 2022-01-18 NOTE — Telephone Encounter (Signed)
Anticoagulation is managed by patient's primary care provider. Request faxed to [facility].  ?

## 2022-02-16 ENCOUNTER — Other Ambulatory Visit: Payer: Self-pay

## 2022-02-22 ENCOUNTER — Ambulatory Visit (INDEPENDENT_AMBULATORY_CARE_PROVIDER_SITE_OTHER): Payer: Medicare Other | Admitting: Cardiology

## 2022-02-22 ENCOUNTER — Encounter: Payer: Self-pay | Admitting: Cardiology

## 2022-02-22 VITALS — BP 150/66 | HR 72 | Ht 68.0 in | Wt 195.1 lb

## 2022-02-22 DIAGNOSIS — Z952 Presence of prosthetic heart valve: Secondary | ICD-10-CM

## 2022-02-22 DIAGNOSIS — I2510 Atherosclerotic heart disease of native coronary artery without angina pectoris: Secondary | ICD-10-CM | POA: Diagnosis not present

## 2022-02-22 DIAGNOSIS — E782 Mixed hyperlipidemia: Secondary | ICD-10-CM | POA: Diagnosis not present

## 2022-02-22 DIAGNOSIS — E1140 Type 2 diabetes mellitus with diabetic neuropathy, unspecified: Secondary | ICD-10-CM

## 2022-02-22 DIAGNOSIS — I10 Essential (primary) hypertension: Secondary | ICD-10-CM

## 2022-02-22 HISTORY — DX: Presence of prosthetic heart valve: Z95.2

## 2022-02-22 NOTE — Progress Notes (Signed)
?Cardiology Office Note:   ? ?Date:  02/22/2022  ? ?ID:  Peter Beltran, DOB 23-Aug-1925, MRN [id number] ? ?PCP:  Javier Glazier, MD  ?Cardiologist:  Jenean Lindau, MD  ? ?Referring MD: Javier Glazier, MD  ? ? ?ASSESSMENT:   ? ?1. Essential hypertension   ?2. Type 2 diabetes mellitus with diabetic neuropathy, without long-term current use of insulin ([place])   ?3. Mixed dyslipidemia   ?4. Coronary artery disease involving native coronary artery of native heart without angina pectoris   ?5. S/P AVR (aortic valve replacement)   ? ?PLAN:   ? ?In order of problems listed above: ? ?Coronary artery disease: Secondary prevention stressed with the patient.  Importance of compliance with diet and medication stressed any vocalized understanding. ?Post aortic valve replacement: On anticoagulation.  Mechanical aortic valve: Managed by his primary care and facility where he lives. ?Essential hypertension: Blood pressure stable and diet was emphasized. ?Mitral valve disease: Regurgitation and stenosis: Stable at this time.  Asymptomatic patient.  Medical therapy. ?Hyperlipidemia with hypertriglyceridemia: Diet emphasized.  Blood work according to patient is managed by his facility where he lives and by primary care.  They will continue doing this.  I told him to request them to forward me a copy of the lab work. ?Patient will be seen in follow-up appointment in 6 months or earlier if the patient has any concerns ? ? ? ?Medication Adjustments/Labs and Tests Ordered: ?Current medicines are reviewed at length with the patient today.  Concerns regarding medicines are outlined above.  ?No orders of the defined types were placed in this encounter. ? ?No orders of the defined types were placed in this encounter. ? ? ? ?No chief complaint on file. ?  ? ?History of Present Illness:   ? ?Peter Beltran is a 88 y.o. male.  Patient has past medical history of coronary artery disease, aortic valve replacement, mitral stenosis and  regurgitation.  He denies any problems at this time and takes care of activities of daily living.  He appears younger than stated age.  No chest pain orthopnea or PND.  At the time of my evaluation, the patient is alert awake oriented and in no distress. ? ?Past Medical History:  ?Diagnosis Date  ? Acute kidney failure, unspecified ([place]) 07/22/2019  ? Acute maxillary sinusitis, unspecified 08/16/2017  ? Allergic rhinitis, unspecified 02/04/2018  ? Anxiety disorder, unspecified 09/17/2013  ? Anxiety state 09/17/2013  ? Aortic stenosis   ? post aortic valve replacement with St.Jude's valve by Dr.VanTrigt on 07-27-11  ? Aortic valve disorder 05/29/2013  ? Atherosclerotic heart disease of native coronary artery without angina pectoris 08/16/2017  ? Bacteremia 08/15/2012  ? Benign prostatic hyperplasia with lower urinary tract symptoms 08/16/2017  ? Bradycardia with less than 60 beats per minute 04/29/2021  ? Candidal stomatitis 08/16/2017  ? Cellulitis, unspecified 07/25/2019  ? Chronic airway obstruction ([place]) 04/21/2013  ? Chronic coronary artery disease 04/10/2016  ? Formatting of this note might be different from the original. Overview:  status post CABG x3 in 2008 Formatting of this note might be different from the original. Overview:  Overview:  status post CABG x3 in 2008  ? Chronic kidney disease, stage 3a ([place]) 10/08/2021  ? Chronic rhinitis 12/27/2019  ? Last Assessment & Plan:  Concern over his nose. Chronic recurring nasal congestion.  Had a severe episode recently that responded to over-the-counter medication.  Currently asymptomatic.  Wants to make sure all is okay. EXAM intranasally  shows no polyps, purulence, masses or obstructing anatomy.  Mucosa is overall healthy. PLAN: Reassured all looks okay.  I think what he is doing is fine.  Happy t  ? Cognitive communication deficit 10/28/2020  ? Constipation 08/16/2017  ? Constipation, unspecified 08/16/2017  ? Coronary artery disease   ? status post CABG x3 in  2008  ? COVID-19 11/10/2020  ? Diabetic polyneuropathy associated with type 2 diabetes mellitus ([place]) 10/04/2016  ? Disorder of the skin and subcutaneous tissue, unspecified 07/25/2019  ? Disorders of muscle in diseases classified elsewhere, multiple sites 10/28/2020  ? Disturbance of salivary secretion 04/21/2013  ? Dry mouth, unspecified 10/18/2018  ? Dyslipidemia   ? Dysphagia 04/21/2013  ? Elevated troponin 10/06/2021  ? Encounter for current long-term use of anticoagulants 05/29/2013  ? Formatting of this note might be different from the original. IMO routine update Formatting of this note might be different from the original. Overview:  IMO routine update  ? Encounter for immunization 07/25/2019  ? Enlarged prostate with lower urinary tract symptoms (LUTS) 05/29/2013  ? Esophageal reflux 02/26/2014  ? Essential hypertension 01/19/2019  ? Fall 04/30/2021  ? Follow-up visit for aortic valve replacement with metallic valve 05/20/7340  ? Foreign body aspiration 03/05/2016  ? Gastro-esophageal reflux disease without esophagitis 02/26/2014  ? Generalized edema 10/18/2018  ? Gout   ? Gouty arthropathy 05/29/2013  ? Hidrocystoma of left eyelid 12/10/2014  ? History of falling 10/28/2020  ? Hyperlipidemia, unspecified 11/11/2016  ? Hyperplasia of prostate   ? benign  ? Hypersomnia 06/14/2013  ? Hypertension   ? Hypertensive heart and chronic kidney disease with heart failure and stage 1 through stage 4 chronic kidney disease, or unspecified chronic kidney disease ([place]) 10/18/2018  ? Hypertensive heart disease with heart failure ([place]) 10/18/2018  ? Hypertriglyceridemia 11/11/2016  ? Hypothyroidism   ? Hypothyroidism, unspecified 04/10/2016  ? Impacted cerumen, bilateral 08/16/2017  ? Impaired fasting glucose 04/21/2013  ? Iron deficiency anemia, unspecified 08/16/2017  ? Ischemic optic neuropathy of both eyes 01/28/2016  ? Lab test positive for detection of COVID-19 virus 11/10/2020  ? Localized edema 07/25/2019  ? Long term  (current) use of anticoagulants 05/29/2013  ? Overview:  IMO routine update IMO routine update  ? Long term (current) use of aspirin 08/16/2017  ? Low back pain 08/16/2017  ? Low-tension glaucoma of both eyes, 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/2016  ? tmax 12/14 OCT 11/03/2016 VF 12/04/2015 Gonio 05/07/2015 tmax 12/14 OCT 11/03/2016 VF 12/04/2015 Gonio 05/07/2015  ? Low-tension glaucoma, bilateral, 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/2016  ? tmax 12/14 OCT 11/03/2016 VF 12/04/2015 Gonio 05/07/2015 tmax 12/14 OCT 11/03/2016 VF 12/04/2015 Gonio 05/07/2015  ? Lumbar stenosis 04/30/2015  ? Memory loss, short term 03/30/2016  ? Mild cognitive impairment 08/30/2016  ? Mixed dyslipidemia 01/19/2019  ? Moderate persistent asthma without complication 93/79/0240  ? Moderate persistent asthma, uncomplicated 97/35/3299  ? Monitoring for long-term anticoagulant use 08/03/2017  ? Muscle weakness (generalized) 10/28/2020  ? Myocardial infarct [facility])   ? Nasal congestion 02/04/2018  ? Neuropathy 04/04/2014  ? Obstructive sleep apnea 12/17/2013  ? Obstructive sleep apnea of adult 12/17/2013  ? Old myocardial infarction 08/22/2019  ? Oral mucositis (ulcerative), unspecified 10/28/2020  ? Osteoarthrosis 04/21/2013  ? Other abnormalities of gait and mobility 10/08/2021  ? Other malaise 07/25/2019  ? Other specified disorders of nose and nasal sinuses 10/28/2020  ? Overweight 01/19/2019  ? Pain in thoracic spine 10/18/2018  ? Periodic limb movement disorder 04/30/2021  ? Periodic limb movements  of sleep 08/26/2013  ? Personal history of COVID-19 10/08/2021  ? Presence of aortocoronary bypass graft 10/18/2018  ? Presence of prosthetic heart valve 10/18/2018  ? Pruritus, unspecified 11/10/2020  ? Pseudophakia of both eyes 12/10/2014  ? Psoriasis and similar disorder 01/28/2014  ? Rash and other nonspecific skin eruption 08/16/2017  ? 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/28/2013  ? Restless legs syndrome 08/26/2013  ? Severe sepsis ([place]) 10/06/2021  ? Spells 08/26/2013  ?  Spinal stenosis, lumbar region without neurogenic claudication 04/30/2015  ? Status post aortic valve replacement with metallic valve 01/60/1093  ? Status post shoulder joint replacement   ? right shoulder

## 2022-02-22 NOTE — Patient Instructions (Signed)

## 2022-04-13 ENCOUNTER — Other Ambulatory Visit: Payer: Self-pay | Admitting: Cardiology

## 2022-04-13 DIAGNOSIS — E782 Mixed hyperlipidemia: Secondary | ICD-10-CM

## 2022-05-13 ENCOUNTER — Other Ambulatory Visit: Payer: Self-pay | Admitting: Cardiology

## 2022-09-16 ENCOUNTER — Encounter: Payer: Self-pay | Admitting: Cardiology

## 2022-09-16 ENCOUNTER — Ambulatory Visit: Payer: Medicare Other | Attending: Cardiology | Admitting: Cardiology

## 2022-09-16 VITALS — BP 130/62 | HR 60 | Ht 67.6 in | Wt 191.0 lb

## 2022-09-16 DIAGNOSIS — E782 Mixed hyperlipidemia: Secondary | ICD-10-CM | POA: Diagnosis present

## 2022-09-16 DIAGNOSIS — I2510 Atherosclerotic heart disease of native coronary artery without angina pectoris: Secondary | ICD-10-CM | POA: Diagnosis present

## 2022-09-16 DIAGNOSIS — E1142 Type 2 diabetes mellitus with diabetic polyneuropathy: Secondary | ICD-10-CM | POA: Insufficient documentation

## 2022-09-16 DIAGNOSIS — I10 Essential (primary) hypertension: Secondary | ICD-10-CM | POA: Insufficient documentation

## 2022-09-16 DIAGNOSIS — Z954 Presence of other heart-valve replacement: Secondary | ICD-10-CM | POA: Insufficient documentation

## 2022-09-16 MED ORDER — FISH OIL 1000 MG PO CAPS: 2.0000 | ORAL_CAPSULE | Freq: Two times a day (BID) | ORAL | 12 refills | Status: DC

## 2022-09-16 NOTE — Progress Notes (Signed)
Cardiology Office Note:    Date:  09/16/2022   ID:  Peter Beltran, DOB 05-May-1925, MRN [id number]  PCP:  No primary care provider on file.  Cardiologist:  Jenean Lindau, MD   Referring MD: Javier Glazier, MD    ASSESSMENT:    1. Coronary artery disease involving native coronary artery of native heart without angina pectoris   2. Essential hypertension   3. Diabetic polyneuropathy associated with type 2 diabetes mellitus ([place])   4. Status post aortic valve replacement with metallic valve   5. Mixed dyslipidemia    PLAN:    In order of problems listed above:  Coronary artery disease: Secondary prevention stressed with the patient.  Importance of compliance with diet and medication stressed any vocalized understanding. Essential hypertension: Blood pressure is stable and diet was emphasized. Mixed dyslipidemia: He is not on lipid-lowering medications and does not want it.  I respect his wishes.  We will add fish oil 2 g twice daily to his regimen in view of hypertriglyceridemia also.  He is agreeable. Post aortic valve replacement: On anticoagulation followed by his providers.  Benefits and risks of anticoagulation explained.  He is stable on his feet and has had not had a fall. Mitral valve pathology: Echocardiogram will be done to follow-up on this. Patient will be seen in follow-up appointment in 6 months or earlier if the patient has any concerns    Medication Adjustments/Labs and Tests Ordered: Current medicines are reviewed at length with the patient today.  Concerns regarding medicines are outlined above.  No orders of the defined types were placed in this encounter.  No orders of the defined types were placed in this encounter.    No chief complaint on file.    History of Present Illness:    Peter Beltran is a 88 y.o. male.  Patient has past medical history of coronary artery disease and aortic stenosis post valve replacement, essential hypertension,  mixed dyslipidemia.  He denies any problems at this time and takes care of activities of daily living.  No chest pain orthopnea or PND.  At the time of my evaluation, the patient is alert awake oriented and in no distress.  He ambulates age appropriately.  No syncope.  Past Medical History:  Diagnosis Date   Acute kidney failure, unspecified ([place]) 07/22/2019   Acute maxillary sinusitis, unspecified 08/16/2017   Allergic rhinitis, unspecified 02/04/2018   Anxiety disorder, unspecified 09/17/2013   Anxiety state 09/17/2013   Aortic stenosis    post aortic valve replacement with St.Jude's valve by Dr.VanTrigt on 07-27-11   Aortic valve disorder 05/29/2013   Atherosclerotic heart disease of native coronary artery without angina pectoris 08/16/2017   Bacteremia 08/15/2012   Benign prostatic hyperplasia with lower urinary tract symptoms 08/16/2017   Bradycardia with less than 60 beats per minute 04/29/2021   Candidal stomatitis 08/16/2017   Cellulitis, unspecified 07/25/2019   Chronic airway obstruction ([place]) 04/21/2013   Chronic coronary artery disease 04/10/2016   Formatting of this note might be different from the original. Overview:  status post CABG x3 in 2008 Formatting of this note might be different from the original. Overview:  Overview:  status post CABG x3 in 2008   Chronic kidney disease, stage 3a ([place]) 10/08/2021   Chronic rhinitis 12/27/2019   Last Assessment & Plan:  Concern over his nose. Chronic recurring nasal congestion.  Had a severe episode recently that responded to over-the-counter medication.  Currently asymptomatic.  Wants to make  sure all is okay. EXAM intranasally shows no polyps, purulence, masses or obstructing anatomy.  Mucosa is overall healthy. PLAN: Reassured all looks okay.  I think what he is doing is fine.  Happy t   Cognitive communication deficit 10/28/2020   Constipation 08/16/2017   Constipation, unspecified 08/16/2017   Coronary artery disease    status  post CABG x3 in 2008   COVID-19 11/10/2020   Diabetic polyneuropathy associated with type 2 diabetes mellitus ([place]) 10/04/2016   Disorder of the skin and subcutaneous tissue, unspecified 07/25/2019   Disorders of muscle in diseases classified elsewhere, multiple sites 10/28/2020   Disturbance of salivary secretion 04/21/2013   Dry mouth, unspecified 10/18/2018   Dyslipidemia    Dysphagia 04/21/2013   Elevated troponin 10/06/2021   Encounter for current long-term use of anticoagulants 05/29/2013   Formatting of this note might be different from the original. IMO routine update Formatting of this note might be different from the original. Overview:  IMO routine update   Encounter for immunization 07/25/2019   Enlarged prostate with lower urinary tract symptoms (LUTS) 05/29/2013   Esophageal reflux 02/26/2014   Essential hypertension 01/19/2019   Fall 04/30/2021   Follow-up visit for aortic valve replacement with metallic valve 12/24/5186   Foreign body aspiration 03/05/2016   Gastro-esophageal reflux disease without esophagitis 02/26/2014   Generalized edema 10/18/2018   Gout    Gouty arthropathy 05/29/2013   Hidrocystoma of left eyelid 12/10/2014   History of falling 10/28/2020   Hyperlipidemia, unspecified 11/11/2016   Hyperplasia of prostate    benign   Hypersomnia 06/14/2013   Hypertension    Hypertensive heart and chronic kidney disease with heart failure and stage 1 through stage 4 chronic kidney disease, or unspecified chronic kidney disease ([place]) 10/18/2018   Hypertensive heart disease with heart failure ([place]) 10/18/2018   Hypertriglyceridemia 11/11/2016   Hypothyroidism    Hypothyroidism, unspecified 04/10/2016   Impacted cerumen, bilateral 08/16/2017   Impaired fasting glucose 04/21/2013   Iron deficiency anemia, unspecified 08/16/2017   Ischemic optic neuropathy of both eyes 01/28/2016   Lab test positive for detection of COVID-19 virus 11/10/2020   Localized edema  07/25/2019   Long term (current) use of anticoagulants 05/29/2013   Overview:  IMO routine update IMO routine update   Long term (current) use of aspirin 08/16/2017   Low back pain 08/16/2017   Low-tension glaucoma of both eyes, 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/2016   tmax 12/14 OCT 11/03/2016 VF 12/04/2015 Gonio 05/07/2015 tmax 12/14 OCT 11/03/2016 VF 12/04/2015 Gonio 05/07/2015   Low-tension glaucoma, bilateral, 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/2016   tmax 12/14 OCT 11/03/2016 VF 12/04/2015 Gonio 05/07/2015 tmax 12/14 OCT 11/03/2016 VF 12/04/2015 Gonio 05/07/2015   Lumbar stenosis 04/30/2015   Memory loss, short term 03/30/2016   Mild cognitive impairment 08/30/2016   Mixed dyslipidemia 01/19/2019   Moderate persistent asthma without complication 41/66/0630   Moderate persistent asthma, uncomplicated 16/11/930   Monitoring for long-term anticoagulant use 08/03/2017   Muscle weakness (generalized) 10/28/2020   Myocardial infarct ([facility])    Nasal congestion 02/04/2018   Nausea with vomiting, unspecified 10/22/2021   Neuropathy 04/04/2014   Obstructive sleep apnea 12/17/2013   Obstructive sleep apnea of adult 12/17/2013   Old myocardial infarction 08/22/2019   Oral mucositis (ulcerative), unspecified 10/28/2020   Osteoarthrosis 04/21/2013   Other abnormalities of gait and mobility 10/08/2021   Other malaise 07/25/2019   Other specified disorders of nose and nasal sinuses 10/28/2020   Overweight 01/19/2019   Pain in thoracic spine  10/18/2018   Periodic limb movement disorder 04/30/2021   Periodic limb movements of sleep 08/26/2013   Personal history of COVID-19 10/08/2021   Presence of aortocoronary bypass graft 10/18/2018   Presence of prosthetic heart valve 10/18/2018   Pruritus, unspecified 11/10/2020   Pseudophakia of both eyes 12/10/2014   Psoriasis and similar disorder 01/28/2014   Rash and other nonspecific skin eruption 08/16/2017   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/28/2013   Restless legs syndrome  08/26/2013   S/P AVR (aortic valve replacement) 02/22/2022   Severe sepsis ([place]) 10/06/2021   Spells 08/26/2013   Spinal stenosis, lumbar region without neurogenic claudication 04/30/2015   Status post aortic valve replacement with metallic valve 93/81/8299   Status post shoulder joint replacement    right shoulder   Subtherapeutic anticoagulation 04/10/2016   Syncope and collapse 06/14/2013   Overview:  with seizure like activity. with seizure like activity.   Traumatic subarachnoid hemorrhage without loss of consciousness, subsequent encounter 10/28/2020   Type 2 diabetes mellitus with diabetic neuropathy, unspecified ([place]) 08/16/2017   Unspecified abnormalities of gait and mobility 10/18/2018   Unspecified Escherichia coli (E. coli) as the cause of diseases classified elsewhere 10/08/2021   Unspecified jaundice 11/10/2020   Unspecified osteoarthritis, unspecified site 04/21/2013   Unspecified urinary incontinence 05/17/2018   Urinary retention 07/18/2018   Vitamin D deficiency, unspecified 08/16/2017   Vomiting and diarrhea 10/06/2021    Past Surgical History:  Procedure Laterality Date   AORTIC VALVE REPLACEMENT     CORONARY ARTERY BYPASS GRAFT     PROSTATE ABLATION     TONSILLECTOMY      Current Medications: Current Meds  Medication Sig   acetaminophen (TYLENOL) 650 MG CR tablet Take 650 mg by mouth every 4 (four) hours as needed for pain.   albuterol (PROVENTIL HFA;VENTOLIN HFA) 108 (90 Base) MCG/ACT inhaler Inhale 2 puffs into the lungs every 6 (six) hours as needed for wheezing or shortness of breath.   ergocalciferol (VITAMIN D2) 1.25 MG (50000 UT) capsule Take 50,000 Units by mouth every Tuesday.   fluticasone (FLONASE) 50 MCG/ACT nasal spray Place 1 spray into both nostrils in the morning and at bedtime.   Fluticasone-Salmeterol (ADVAIR) 100-50 MCG/DOSE AEPB Inhale 2 puffs into the lungs 2 (two) times daily.   hydrALAZINE (APRESOLINE) 10 MG tablet Take 10 mg by mouth  as needed (high blood pressure).   latanoprost (XALATAN) 0.005 % ophthalmic solution Place 1 drop into both eyes at bedtime.   levothyroxine (SYNTHROID) 112 MCG tablet Take 112 mcg by mouth See admin instructions. 6 times week   levothyroxine (SYNTHROID) 200 MCG tablet Take 200 mcg by mouth once a week.   nitroGLYCERIN (NITROSTAT) 0.4 MG SL tablet DISSOLVE 1 TABLET UNDER TONGUE EVERY 5 MINUTES FOR UP TO 3 DOSES AS NEEDED FOR CHEST PAIN   omeprazole (PRILOSEC) 20 MG capsule Take 20 mg by mouth 2 (two) times daily before a meal.   rOPINIRole (REQUIP) 0.5 MG tablet Take 0.5 mg by mouth daily.   rOPINIRole (REQUIP) 1 MG tablet Take 1 mg by mouth daily.   SLOW FE 142 (45 Fe) MG TBCR Take 45 mg by mouth daily.   spironolactone (ALDACTONE) 25 MG tablet Take 25 mg by mouth daily.   timolol (TIMOPTIC) 0.5 % ophthalmic solution Place 1 drop into both eyes 2 (two) times daily.   warfarin (COUMADIN) 5 MG tablet Take 5 mg by mouth as directed. 5 times a week   warfarin (COUMADIN) 6 MG tablet Take 6  mg by mouth 2 (two) times a week.     Allergies:   Atorvastatin calcium, Dorzolamide, Brimonidine, Brinzolamide-brimonidine, Lipitor [atorvastatin calcium], Rotigotine, Tamsulosin, Zolpidem, Dutasteride, and Vancomycin   Social History   Socioeconomic History   Marital status: Married    Spouse name: Not on file   Number of children: Not on file   Years of education: Not on file   Highest education level: Not on file  Occupational History   Not on file  Tobacco Use   Smoking status: Former   Smokeless tobacco: Never  Substance and Sexual Activity   Alcohol use: No   Drug use: No   Sexual activity: Not on file  Other Topics Concern   Not on file  Social History Narrative   Not on file   Social Determinants of Health   Financial Resource Strain: Not on file  Food Insecurity: Not on file  Transportation Needs: Not on file  Physical Activity: Not on file  Stress: Not on file  Social  Connections: Not on file     Family History: The patient's family history is negative for Hypertension, Diabetes, Cancer, and Heart disease.  ROS:   Please see the history of present illness.    All other systems reviewed and are negative.  EKGs/Labs/Other Studies Reviewed:    The following studies were reviewed today: I discussed my findings with the patient at length   Recent Labs: 10/05/2021: B Natriuretic Peptide 427.9 10/06/2021: Magnesium 1.9; TSH 0.247 10/07/2021: ALT 62; BUN 22; Creatinine, Ser 1.11; Hemoglobin 10.3; Platelets 117; Potassium 3.7; Sodium 132  Recent Lipid Panel    Component Value Date/Time   CHOL 242 (H) 04/15/2021 1614   TRIG 782 (HH) 04/15/2021 1614   HDL 19 (L) 04/15/2021 1614   CHOLHDL 12.7 (H) 04/15/2021 1614   CHOLHDL 8.5 04/11/2016 0300   VLDL UNABLE TO CALCULATE IF TRIGLYCERIDE OVER 400 mg/dL 04/11/2016 0300   LDLCALC 91 04/15/2021 1614    Physical Exam:    VS:  BP 130/62   Pulse 60   Ht 5' 7.6" (1.717 m)   Wt 191 lb (86.6 kg)   SpO2 98%   BMI 29.39 kg/m     Wt Readings from Last 3 Encounters:  09/16/22 191 lb (86.6 kg)  02/22/22 195 lb 1.9 oz (88.5 kg)  10/14/21 195 lb (88.5 kg)     GEN: Patient is in no acute distress HEENT: Normal NECK: No JVD; No carotid bruits LYMPHATICS: No lymphadenopathy CARDIAC: Hear sounds regular, 2/6 systolic murmur at the apex. RESPIRATORY:  Clear to auscultation without rales, wheezing or rhonchi  ABDOMEN: Soft, non-tender, non-distended MUSCULOSKELETAL:  No edema; No deformity  SKIN: Warm and dry NEUROLOGIC:  Alert and oriented x 3 PSYCHIATRIC:  Normal affect   Signed, Jenean Lindau, MD  09/16/2022 11:36 AM    [place]

## 2022-09-16 NOTE — Patient Instructions (Signed)
Medication Instructions:  Your physician has recommended you make the following change in your medication:   Start taking 2,000 mg of fish oil twice daily.  *If you need a refill on your cardiac medications before your next appointment, please call your pharmacy*   Lab Work: None ordered If you have labs (blood work) drawn today and your tests are completely normal, you will receive your results only by: [place] (if you have MyChart) OR A paper copy in the mail If you have any lab test that is abnormal or we need to change your treatment, we will call you to review the results.   Testing/Procedures: Your physician has requested that you have an echocardiogram. Echocardiography is a painless test that uses sound waves to create images of your heart. It provides your doctor with information about the size and shape of your heart and how well your heart's chambers and valves are working. This procedure takes approximately one hour. There are no restrictions for this procedure.    Follow-Up: At [facility], you and your health needs are our priority.  As part of our continuing mission to provide you with exceptional heart care, we have created designated Provider Care Teams.  These Care Teams include your primary Cardiologist (physician) and Advanced Practice Providers (APPs -  Physician Assistants and Nurse Practitioners) who all work together to provide you with the care you need, when you need it.  We recommend signing up for the patient portal called "MyChart".  Sign up information is provided on this After Visit Summary.  MyChart is used to connect with patients for Virtual Visits (Telemedicine).  Patients are able to view lab/test results, encounter notes, upcoming appointments, etc.  Non-urgent messages can be sent to your provider as well.   To learn more about what you can do with MyChart, go to [email or web address].    Your next appointment:   12 month(s)  The format  for your next appointment:   In Person  Provider:   Jyl Heinz, MD   Other Instructions Echocardiogram An echocardiogram is a test that uses sound waves (ultrasound) to produce images of the heart. Images from an echocardiogram can provide important information about: Heart size and shape. The size and thickness and movement of your heart's walls. Heart muscle function and strength. Heart valve function or if you have stenosis. Stenosis is when the heart valves are too narrow. If blood is flowing backward through the heart valves (regurgitation). A tumor or infectious growth around the heart valves. Areas of heart muscle that are not working well because of poor blood flow or injury from a heart attack. Aneurysm detection. An aneurysm is a weak or damaged part of an artery wall. The wall bulges out from the normal force of blood pumping through the body. Tell a health care provider about: Any allergies you have. All medicines you are taking, including vitamins, herbs, eye drops, creams, and over-the-counter medicines. Any blood disorders you have. Any surgeries you have had. Any medical conditions you have. Whether you are pregnant or may be pregnant. What are the risks? Generally, this is a safe test. However, problems may occur, including an allergic reaction to dye (contrast) that may be used during the test. What happens before the test? No specific preparation is needed. You may eat and drink normally. What happens during the test? You will take off your clothes from the waist up and put on a hospital gown. Electrodes or electrocardiogram (ECG)patches may be placed on  your chest. The electrodes or patches are then connected to a device that monitors your heart rate and rhythm. You will lie down on a table for an ultrasound exam. A gel will be applied to your chest to help sound waves pass through your skin. A handheld device, called a transducer, will be pressed against  your chest and moved over your heart. The transducer produces sound waves that travel to your heart and bounce back (or "echo" back) to the transducer. These sound waves will be captured in real-time and changed into images of your heart that can be viewed on a video monitor. The images will be recorded on a computer and reviewed by your health care provider. You may be asked to change positions or hold your breath for a short time. This makes it easier to get different views or better views of your heart. In some cases, you may receive contrast through an IV in one of your veins. This can improve the quality of the pictures from your heart. The procedure may vary among health care providers and hospitals.   What can I expect after the test? You may return to your normal, everyday life, including diet, activities, and medicines, unless your health care provider tells you not to do that. Follow these instructions at home: It is up to you to get the results of your test. Ask your health care provider, or the department that is doing the test, when your results will be ready. Keep all follow-up visits. This is important. Summary An echocardiogram is a test that uses sound waves (ultrasound) to produce images of the heart. Images from an echocardiogram can provide important information about the size and shape of your heart, heart muscle function, heart valve function, and other possible heart problems. You do not need to do anything to prepare before this test. You may eat and drink normally. After the echocardiogram is completed, you may return to your normal, everyday life, unless your health care provider tells you not to do that. This information is not intended to replace advice given to you by your health care provider. Make sure you discuss any questions you have with your health care provider. Document Revised: 06/24/2020 Document Reviewed: 06/24/2020 Elsevier Patient Education  2021 [employer].

## 2022-10-01 ENCOUNTER — Ambulatory Visit (HOSPITAL_BASED_OUTPATIENT_CLINIC_OR_DEPARTMENT_OTHER)
Admission: RE | Admit: 2022-10-01 | Discharge: 2022-10-01 | Disposition: A | Discharge status: Home / Self Care | Payer: Medicare Other | Source: Ambulatory Visit | Attending: Cardiology | Admitting: Cardiology

## 2022-10-01 DIAGNOSIS — I2510 Atherosclerotic heart disease of native coronary artery without angina pectoris: Secondary | ICD-10-CM | POA: Insufficient documentation

## 2022-10-02 LAB — ECHOCARDIOGRAM COMPLETE
AR max vel: 1.31 cm2
AV Area VTI: 1.28 cm2
AV Area mean vel: 1.19 cm2
AV Mean grad: 14.3 mmHg
AV Peak grad: 25.6 mmHg
Ao pk vel: 2.53 m/s
Area-P 1/2: 1.63 cm2
MV VTI: 1.19 cm2
S' Lateral: 2.3 cm
Single Plane A4C EF: 63.8 %

## 2022-12-05 NOTE — Progress Notes (Signed)
Office Visit    Patient Name: Peter Beltran Date of Encounter: 12/05/2022  Primary Care Provider:  Arlyss Repress, MD Primary Cardiologist:  None Primary Electrophysiologist: None  Chief Complaint    Peter Beltran is a 89 y.o. male with PMH of CAD s/p CABG times 01/2007, HTN, severe aortic stenosis s/p mechanical AVR 2012 (on Coumadin), mild aortic dilation (38 mm) HLD, DM type II who presents today for follow-up of coronary artery disease.  Past Medical History    Past Medical History:  Diagnosis Date   Acute kidney failure, unspecified ([place]) 07/22/2019   Acute maxillary sinusitis, unspecified 08/16/2017   Allergic rhinitis, unspecified 02/04/2018   Anxiety disorder, unspecified 09/17/2013   Anxiety state 09/17/2013   Aortic stenosis    post aortic valve replacement with St.Jude's valve by Dr.VanTrigt on 07-27-11   Aortic valve disorder 05/29/2013   Atherosclerotic heart disease of native coronary artery without angina pectoris 08/16/2017   Bacteremia 08/15/2012   Benign prostatic hyperplasia with lower urinary tract symptoms 08/16/2017   Bradycardia with less than 60 beats per minute 04/29/2021   Candidal stomatitis 08/16/2017   Cellulitis, unspecified 07/25/2019   Chronic airway obstruction ([place]) 04/21/2013   Chronic coronary artery disease 04/10/2016   Formatting of this note might be different from the original. Overview:  status post CABG x3 in 2008 Formatting of this note might be different from the original. Overview:  Overview:  status post CABG x3 in 2008   Chronic kidney disease, stage 3a ([place]) 10/08/2021   Chronic rhinitis 12/27/2019   Last Assessment & Plan:  Concern over his nose. Chronic recurring nasal congestion.  Had a severe episode recently that responded to over-the-counter medication.  Currently asymptomatic.  Wants to make sure all is okay. EXAM intranasally shows no polyps, purulence, masses or obstructing anatomy.  Mucosa is overall healthy.  PLAN: Reassured all looks okay.  I think what he is doing is fine.  Happy t   Cognitive communication deficit 10/28/2020   Constipation 08/16/2017   Constipation, unspecified 08/16/2017   Coronary artery disease    status post CABG x3 in 2008   COVID-19 11/10/2020   Diabetic polyneuropathy associated with type 2 diabetes mellitus ([place]) 10/04/2016   Disorder of the skin and subcutaneous tissue, unspecified 07/25/2019   Disorders of muscle in diseases classified elsewhere, multiple sites 10/28/2020   Disturbance of salivary secretion 04/21/2013   Dry mouth, unspecified 10/18/2018   Dyslipidemia    Dysphagia 04/21/2013   Elevated troponin 10/06/2021   Encounter for current long-term use of anticoagulants 05/29/2013   Formatting of this note might be different from the original. IMO routine update Formatting of this note might be different from the original. Overview:  IMO routine update   Encounter for immunization 07/25/2019   Enlarged prostate with lower urinary tract symptoms (LUTS) 05/29/2013   Esophageal reflux 02/26/2014   Essential hypertension 01/19/2019   Fall 04/30/2021   Follow-up visit for aortic valve replacement with metallic valve 4/0/9811   Foreign body aspiration 03/05/2016   Gastro-esophageal reflux disease without esophagitis 02/26/2014   Generalized edema 10/18/2018   Gout    Gouty arthropathy 05/29/2013   Hidrocystoma of left eyelid 12/10/2014   History of falling 10/28/2020   Hyperlipidemia, unspecified 11/11/2016   Hyperplasia of prostate    benign   Hypersomnia 06/14/2013   Hypertension    Hypertensive heart and chronic kidney disease with heart failure and stage 1 through stage 4 chronic kidney disease, or unspecified chronic  kidney disease ([place]) 10/18/2018   Hypertensive heart disease with heart failure ([place]) 10/18/2018   Hypertriglyceridemia 11/11/2016   Hypothyroidism    Hypothyroidism, unspecified 04/10/2016   Impacted cerumen, bilateral 08/16/2017    Impaired fasting glucose 04/21/2013   Iron deficiency anemia, unspecified 08/16/2017   Ischemic optic neuropathy of both eyes 01/28/2016   Lab test positive for detection of COVID-19 virus 11/10/2020   Localized edema 07/25/2019   Long term (current) use of anticoagulants 05/29/2013   Overview:  IMO routine update IMO routine update   Long term (current) use of aspirin 08/16/2017   Low back pain 08/16/2017   Low-tension glaucoma of both eyes, 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/2016   tmax 12/14 OCT 11/03/2016 VF 12/04/2015 Gonio 05/07/2015 tmax 12/14 OCT 11/03/2016 VF 12/04/2015 Gonio 05/07/2015   Low-tension glaucoma, bilateral, 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/2016   tmax 12/14 OCT 11/03/2016 VF 12/04/2015 Gonio 05/07/2015 tmax 12/14 OCT 11/03/2016 VF 12/04/2015 Gonio 05/07/2015   Lumbar stenosis 04/30/2015   Memory loss, short term 03/30/2016   Mild cognitive impairment 08/30/2016   Mixed dyslipidemia 01/19/2019   Moderate persistent asthma without complication 41/93/7902   Moderate persistent asthma, uncomplicated 40/97/3532   Monitoring for long-term anticoagulant use 08/03/2017   Muscle weakness (generalized) 10/28/2020   Myocardial infarct (HCC)    Nasal congestion 02/04/2018   Nausea with vomiting, unspecified 10/22/2021   Neuropathy 04/04/2014   Obstructive sleep apnea 12/17/2013   Obstructive sleep apnea of adult 12/17/2013   Old myocardial infarction 08/22/2019   Oral mucositis (ulcerative), unspecified 10/28/2020   Osteoarthrosis 04/21/2013   Other abnormalities of gait and mobility 10/08/2021   Other malaise 07/25/2019   Other specified disorders of nose and nasal sinuses 10/28/2020   Overweight 01/19/2019   Pain in thoracic spine 10/18/2018   Periodic limb movement disorder 04/30/2021   Periodic limb movements of sleep 08/26/2013   Personal history of COVID-19 10/08/2021   Presence of aortocoronary bypass graft 10/18/2018   Presence of prosthetic heart valve 10/18/2018   Pruritus, unspecified  11/10/2020   Pseudophakia of both eyes 12/10/2014   Psoriasis and similar disorder 01/28/2014   Rash and other nonspecific skin eruption 08/16/2017   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 with seizure like activity.   Traumatic subarachnoid hemorrhage without loss of consciousness, subsequent encounter 10/28/2020   Type 2 diabetes mellitus with diabetic neuropathy, unspecified ([place]) 08/16/2017   Unspecified abnormalities of gait and mobility 10/18/2018   Unspecified Escherichia coli (E. coli) as the cause of diseases classified elsewhere 10/08/2021   Unspecified jaundice 11/10/2020   Unspecified osteoarthritis, unspecified site 04/21/2013   Unspecified urinary incontinence 05/17/2018   Urinary retention 07/18/2018   Vitamin D deficiency, unspecified 08/16/2017   Vomiting and diarrhea 10/06/2021   Past Surgical History:  Procedure Laterality Date   AORTIC VALVE REPLACEMENT     CORONARY ARTERY BYPASS GRAFT     PROSTATE ABLATION     TONSILLECTOMY      Allergies  Allergies  Allergen Reactions   Atorvastatin Calcium Other (See Comments)    Developed myopathy.     Dorzolamide  Other (See Comments)    Follicula     Brimonidine Other (See Comments)    Red and burning eyes   Brinzolamide-Brimonidine Swelling    Redness in eyes - reaction to [place] [Atorvastatin Calcium] Other (See Comments)    Developed myopathy.   Rotigotine Other (See Comments)    Falling asleep uncontrollably - reaction to Neupro    Tamsulosin Other (See Comments)    Unknown reaction (Flomax)   Zolpidem Other (See Comments)    Pt. states that he possibly had a neuro reaction.   Dutasteride Rash    Reaction to Avodart   Vancomycin Rash    History of Present Illness    Peter Beltran  is a 89 year old male with the above mention past medical history who presents today for follow-up of coronary artery disease and aortic valve replacement.  Mr. [name] was initially seen by Dr. Geraldo Pitter on 01/2021 establish care previously from Dr. Wynonia Lawman.  He had a previous CABG x 3 completed in 2008 and underwent mechanical AVR repair by Dr. Darcey Nora in 07/27/2011.  He is currently on Coumadin and is currently being managed by patient's PCP.  He was seen in the ED 04/2021 due to bradycardia.  He was noted to have heart rate in the 40s and was difficult to arouse.  During visit his beta-blocker was held and washout was complete following 48 hours with no indication for pacemaker.  He was admitted 10/06/2021 for sepsis in the setting of COVID-19 illness.  He was found to have abnormal troponins but reported no chest pain.  2D echo was completed and showed EF of 60 to 65%, no RWMA, grade 2 DD, mildly reduced RV function with moderately dilated RA and mildly dilated LA with mild to moderate MV stenosis mild dilation of ascending aorta of 38 mmHg.  There were no other workup was completed for ischemic evaluation at that time and elevated troponin found to be secondary to demand ischemia in the setting of E. coli.  He was last seen by Dr. Geraldo Pitter in office on 09/16/2022.  During visit patient's blood pressure was stable and patient denied antilipid medication however was taking fish oil twice daily.  Repeat 2D echo was completed that revealed mild mitral and aortic stenosis with mean AV gradient of 14.3 mmHg and mitral valve gradient of 4.0.  Mr. [name] presents today for follow-up alone.  Since last being seen in the office patient reports that he is  doing well with no new cardiac complaints.  His blood pressure today is well-controlled at 118/60 and heart rate was 62 bpm.  He is euvolemic on exam denies any shortness of breath, chest pain, or dizziness.  He reports that he has some dietary indiscretions with greens that has caused his INR to be abnormal.  We discussed in length the importance of abstaining from foods rich in vitamin K and he will be provided a handout with his AVS today.  He reports that his blood draws are managed by Dr. Antony Salmon and INR is being managed closely.  We discussed possible referral to clinic pharmacy to discuss vitamin K rich foods and management of Coumadin.  He politely declines referral at this time and will be provided resources regarding the above-mentioned topic.  Patient denies chest pain, palpitations, dyspnea, PND, orthopnea, nausea, vomiting, dizziness, syncope, edema, weight gain, or early satiety.    Home Medications    Current Outpatient Medications  Medication Sig Dispense Refill   acetaminophen (  TYLENOL) 650 MG CR tablet Take 650 mg by mouth every 4 (four) hours as needed for pain.     albuterol (PROVENTIL HFA;VENTOLIN HFA) 108 (90 Base) MCG/ACT inhaler Inhale 2 puffs into the lungs every 6 (six) hours as needed for wheezing or shortness of breath.     Docusate Sodium (DSS) 100 MG CAPS Take 100 mg by mouth daily as needed for constipation. (Patient not taking: Reported on 09/16/2022)     ergocalciferol (VITAMIN D2) 1.25 MG (50000 UT) capsule Take 50,000 Units by mouth every Tuesday.     fluticasone (FLONASE) 50 MCG/ACT nasal spray Place 1 spray into both nostrils in the morning and at bedtime.     Fluticasone-Salmeterol (ADVAIR) 100-50 MCG/DOSE AEPB Inhale 2 puffs into the lungs 2 (two) times daily.     hydrALAZINE (APRESOLINE) 10 MG tablet Take 10 mg by mouth as needed (high blood pressure).     latanoprost (XALATAN) 0.005 % ophthalmic solution Place 1 drop into both eyes at bedtime.     levothyroxine  (SYNTHROID) 112 MCG tablet Take 112 mcg by mouth See admin instructions. 6 times week     levothyroxine (SYNTHROID) 200 MCG tablet Take 200 mcg by mouth once a week.     nitroGLYCERIN (NITROSTAT) 0.4 MG SL tablet DISSOLVE 1 TABLET UNDER TONGUE EVERY 5 MINUTES FOR UP TO 3 DOSES AS NEEDED FOR CHEST PAIN 25 tablet 0   Omega-3 Fatty Acids (FISH OIL) 1000 MG CAPS Take 2 capsules (2,000 mg total) by mouth 2 (two) times daily. 180 capsule 12   omeprazole (PRILOSEC) 20 MG capsule Take 20 mg by mouth 2 (two) times daily before a meal.     polyethylene glycol (MIRALAX / GLYCOLAX) 17 g packet Take 17 g by mouth as needed for mild constipation. (Patient not taking: Reported on 09/16/2022)     rOPINIRole (REQUIP) 0.5 MG tablet Take 0.5 mg by mouth daily.     rOPINIRole (REQUIP) 1 MG tablet Take 1 mg by mouth daily.     SLOW FE 142 (45 Fe) MG TBCR Take 45 mg by mouth daily.     spironolactone (ALDACTONE) 25 MG tablet Take 25 mg by mouth daily.     timolol (TIMOPTIC) 0.5 % ophthalmic solution Place 1 drop into both eyes 2 (two) times daily.     warfarin (COUMADIN) 5 MG tablet Take 5 mg by mouth as directed. 5 times a week     warfarin (COUMADIN) 6 MG tablet Take 6 mg by mouth 2 (two) times a week.     No current facility-administered medications for this visit.     Review of Systems  Please see the history of present illness.      All other systems reviewed and are otherwise negative except as noted above.  Physical Exam    Wt Readings from Last 3 Encounters:  09/16/22 191 lb (86.6 kg)  02/22/22 195 lb 1.9 oz (88.5 kg)  10/14/21 195 lb (88.5 kg)   DD:UKGUR were no vitals filed for this visit.,There is no height or weight on file to calculate BMI.  Constitutional:      Appearance: Healthy appearance. Not in distress.  Neck:     Vascular: JVD normal.  Pulmonary:     Effort: Pulmonary effort is normal.     Breath sounds: No wheezing. No rales. Diminished in the bases Cardiovascular:     Normal  rate. Regular rhythm. Normal S1. Normal S2.      Murmurs: There is no murmur.  Edema:    Peripheral edema absent.  Abdominal:     Palpations: Abdomen is soft non tender. There is no hepatomegaly.  Skin:    General: Skin is warm and dry.  Neurological:     General: No focal deficit present.     Mental Status: Alert and oriented to person, place and time.     Cranial Nerves: Cranial nerves are intact.  EKG/LABS/Other Studies Reviewed    ECG personally reviewed by me today -sinus rhythm with left axis deviation and RBBB with possible LVH and rate of 60 bpm with no acute changes noted.    Lab Results  Component Value Date   CREATININE 1.11 10/07/2021   BUN 22 10/07/2021   NA 132 (L) 10/07/2021   K 3.7 10/07/2021   CL 104 10/07/2021   CO2 19 (L) 10/07/2021   Lab Results  Component Value Date   ALT 62 (H) 10/07/2021   AST 105 (H) 10/07/2021   ALKPHOS 57 10/07/2021   BILITOT 5.0 (H) 10/07/2021   Lab Results  Component Value Date   CHOL 242 (H) 04/15/2021   HDL 19 (L) 04/15/2021   LDLCALC 91 04/15/2021   TRIG 782 (HH) 04/15/2021   CHOLHDL 12.7 (H) 04/15/2021    Lab Results  Component Value Date   HGBA1C 6.0 (H) 10/06/2021    Assessment & Plan    1.  Coronary artery disease: -s/p CABG x 3 and 2008 with patient's most recent ischemic evaluation completed in 2012 by stress Myoview with no ischemia noted. -Today patient reports no complaints of chest pain or anginal equivalent. -Patient is currently not on statin therapy made through shared decision with primary cardiologist and is currently on fish oil 1000 mg twice daily.  2.  History of aortic valve replacement: -s/p AVR replaced in 2012 by Dr. Darcey Nora.  Patient is currently on Coumadin and INR managed by PCP.  Aortic valve mean gradient of 14.3 mmHg per recent 2D echo 09/2022 -Today patient reports no dizziness or shortness of breath. -SBE prophylaxis discussed -Continue Coumadin per PCP, hydralazine 10 mg for  elevated BP  3.  Mitral valve regurgitation: -2D echo completed 10/02/2022 showing mild mitral regurgitation with gradient of 4.0 mmHg and moderate mitral annular calcification -Continue blood pressure control and dietary sodium restriction as noted above.  4.  Hyperlipidemia: -Patient currently not on lipid-lowering agent.  Shared decision and is currently on fish oil 2 g twice daily  Disposition: Follow-up with None or APP in 6 months   Medication Adjustments/Labs and Tests Ordered: Current medicines are reviewed at length with the patient today.  Concerns regarding medicines are outlined above.   Signed, Mable Fill, Marissa Nestle, NP 12/05/2022, 4:31 PM [facility]  Note:  This document was prepared using Dragon voice recognition software and may include unintentional dictation errors.

## 2022-12-06 ENCOUNTER — Ambulatory Visit: Payer: Medicare Other | Attending: Nurse Practitioner | Admitting: Nurse Practitioner

## 2022-12-06 ENCOUNTER — Encounter: Payer: Self-pay | Admitting: Nurse Practitioner

## 2022-12-06 VITALS — BP 118/60 | HR 62 | Ht 67.0 in | Wt 194.8 lb

## 2022-12-06 DIAGNOSIS — I2510 Atherosclerotic heart disease of native coronary artery without angina pectoris: Secondary | ICD-10-CM | POA: Diagnosis not present

## 2022-12-06 DIAGNOSIS — Z954 Presence of other heart-valve replacement: Secondary | ICD-10-CM | POA: Insufficient documentation

## 2022-12-06 DIAGNOSIS — E1140 Type 2 diabetes mellitus with diabetic neuropathy, unspecified: Secondary | ICD-10-CM | POA: Diagnosis not present

## 2022-12-06 DIAGNOSIS — Z8679 Personal history of other diseases of the circulatory system: Secondary | ICD-10-CM

## 2022-12-06 DIAGNOSIS — E782 Mixed hyperlipidemia: Secondary | ICD-10-CM | POA: Diagnosis not present

## 2022-12-06 NOTE — Patient Instructions (Addendum)
Medication Instructions:  Mega Red Fish Oil Your physician recommends that you continue on your current medications as directed. Please refer to the Current Medication list given to you today. *If you need a refill on your cardiac medications before your next appointment, please call your pharmacy*   Lab Work: None ordered  Testing/Procedures: None Ordered  Follow-Up: At [facility], you and your health needs are our priority.  As part of our continuing mission to provide you with exceptional heart care, we have created designated Provider Care Teams.  These Care Teams include your primary Cardiologist (physician) and Advanced Practice Providers (APPs -  Physician Assistants and Nurse Practitioners) who all work together to provide you with the care you need, when you need it.  We recommend signing up for the patient portal called "MyChart".  Sign up information is provided on this After Visit Summary.  MyChart is used to connect with patients for Virtual Visits (Telemedicine).  Patients are able to view lab/test results, encounter notes, upcoming appointments, etc.  Non-urgent messages can be sent to your provider as well.   To learn more about what you can do with MyChart, go to [email or web address].    Your next appointment:   6 month(s)  Provider:   Freada Bergeron, MD  Other Instructions

## 2023-08-21 ENCOUNTER — Other Ambulatory Visit: Payer: Self-pay

## 2023-08-21 ENCOUNTER — Emergency Department (HOSPITAL_COMMUNITY): Payer: Medicare Other

## 2023-08-21 ENCOUNTER — Inpatient Hospital Stay (HOSPITAL_COMMUNITY)
Admission: EM | Admit: 2023-08-21 | Discharge: 2023-09-16 | DRG: 951 | Disposition: E | Payer: Medicare Other | Source: Skilled Nursing Facility | Attending: Internal Medicine | Admitting: Internal Medicine

## 2023-08-21 DIAGNOSIS — N400 Enlarged prostate without lower urinary tract symptoms: Secondary | ICD-10-CM | POA: Diagnosis present

## 2023-08-21 DIAGNOSIS — I10 Essential (primary) hypertension: Secondary | ICD-10-CM | POA: Diagnosis present

## 2023-08-21 DIAGNOSIS — Z79890 Hormone replacement therapy (postmenopausal): Secondary | ICD-10-CM

## 2023-08-21 DIAGNOSIS — E039 Hypothyroidism, unspecified: Secondary | ICD-10-CM | POA: Diagnosis present

## 2023-08-21 DIAGNOSIS — E1122 Type 2 diabetes mellitus with diabetic chronic kidney disease: Secondary | ICD-10-CM | POA: Diagnosis present

## 2023-08-21 DIAGNOSIS — Z515 Encounter for palliative care: Principal | ICD-10-CM

## 2023-08-21 DIAGNOSIS — K219 Gastro-esophageal reflux disease without esophagitis: Secondary | ICD-10-CM | POA: Diagnosis present

## 2023-08-21 DIAGNOSIS — E871 Hypo-osmolality and hyponatremia: Secondary | ICD-10-CM | POA: Diagnosis present

## 2023-08-21 DIAGNOSIS — K810 Acute cholecystitis: Secondary | ICD-10-CM | POA: Diagnosis present

## 2023-08-21 DIAGNOSIS — Z881 Allergy status to other antibiotic agents status: Secondary | ICD-10-CM

## 2023-08-21 DIAGNOSIS — K559 Vascular disorder of intestine, unspecified: Secondary | ICD-10-CM | POA: Diagnosis present

## 2023-08-21 DIAGNOSIS — Z961 Presence of intraocular lens: Secondary | ICD-10-CM | POA: Diagnosis present

## 2023-08-21 DIAGNOSIS — I130 Hypertensive heart and chronic kidney disease with heart failure and stage 1 through stage 4 chronic kidney disease, or unspecified chronic kidney disease: Secondary | ICD-10-CM | POA: Diagnosis present

## 2023-08-21 DIAGNOSIS — E782 Mixed hyperlipidemia: Secondary | ICD-10-CM | POA: Diagnosis present

## 2023-08-21 DIAGNOSIS — E1142 Type 2 diabetes mellitus with diabetic polyneuropathy: Secondary | ICD-10-CM | POA: Diagnosis present

## 2023-08-21 DIAGNOSIS — J4489 Other specified chronic obstructive pulmonary disease: Secondary | ICD-10-CM | POA: Diagnosis present

## 2023-08-21 DIAGNOSIS — M4854XA Collapsed vertebra, not elsewhere classified, thoracic region, initial encounter for fracture: Secondary | ICD-10-CM | POA: Diagnosis present

## 2023-08-21 DIAGNOSIS — R41841 Cognitive communication deficit: Secondary | ICD-10-CM | POA: Diagnosis present

## 2023-08-21 DIAGNOSIS — I359 Nonrheumatic aortic valve disorder, unspecified: Secondary | ICD-10-CM | POA: Diagnosis present

## 2023-08-21 DIAGNOSIS — Z8616 Personal history of COVID-19: Secondary | ICD-10-CM | POA: Diagnosis not present

## 2023-08-21 DIAGNOSIS — E785 Hyperlipidemia, unspecified: Secondary | ICD-10-CM | POA: Diagnosis present

## 2023-08-21 DIAGNOSIS — R791 Abnormal coagulation profile: Secondary | ICD-10-CM | POA: Diagnosis present

## 2023-08-21 DIAGNOSIS — I452 Bifascicular block: Secondary | ICD-10-CM | POA: Diagnosis present

## 2023-08-21 DIAGNOSIS — Z952 Presence of prosthetic heart valve: Secondary | ICD-10-CM

## 2023-08-21 DIAGNOSIS — Z79899 Other long term (current) drug therapy: Secondary | ICD-10-CM

## 2023-08-21 DIAGNOSIS — Z96611 Presence of right artificial shoulder joint: Secondary | ICD-10-CM | POA: Diagnosis present

## 2023-08-21 DIAGNOSIS — E875 Hyperkalemia: Secondary | ICD-10-CM | POA: Diagnosis present

## 2023-08-21 DIAGNOSIS — I509 Heart failure, unspecified: Secondary | ICD-10-CM | POA: Diagnosis present

## 2023-08-21 DIAGNOSIS — I2510 Atherosclerotic heart disease of native coronary artery without angina pectoris: Secondary | ICD-10-CM | POA: Diagnosis present

## 2023-08-21 DIAGNOSIS — G2581 Restless legs syndrome: Secondary | ICD-10-CM | POA: Diagnosis present

## 2023-08-21 DIAGNOSIS — G4733 Obstructive sleep apnea (adult) (pediatric): Secondary | ICD-10-CM | POA: Diagnosis present

## 2023-08-21 DIAGNOSIS — Z7982 Long term (current) use of aspirin: Secondary | ICD-10-CM

## 2023-08-21 DIAGNOSIS — Z66 Do not resuscitate: Secondary | ICD-10-CM | POA: Diagnosis present

## 2023-08-21 DIAGNOSIS — Z7901 Long term (current) use of anticoagulants: Secondary | ICD-10-CM

## 2023-08-21 DIAGNOSIS — K82A1 Gangrene of gallbladder in cholecystitis: Secondary | ICD-10-CM | POA: Diagnosis present

## 2023-08-21 DIAGNOSIS — N1831 Chronic kidney disease, stage 3a: Secondary | ICD-10-CM | POA: Diagnosis present

## 2023-08-21 DIAGNOSIS — J4540 Moderate persistent asthma, uncomplicated: Secondary | ICD-10-CM | POA: Diagnosis present

## 2023-08-21 DIAGNOSIS — I252 Old myocardial infarction: Secondary | ICD-10-CM

## 2023-08-21 DIAGNOSIS — Z7951 Long term (current) use of inhaled steroids: Secondary | ICD-10-CM

## 2023-08-21 DIAGNOSIS — Z6830 Body mass index (BMI) 30.0-30.9, adult: Secondary | ICD-10-CM | POA: Diagnosis not present

## 2023-08-21 DIAGNOSIS — Z9181 History of falling: Secondary | ICD-10-CM

## 2023-08-21 DIAGNOSIS — E872 Acidosis, unspecified: Secondary | ICD-10-CM | POA: Diagnosis present

## 2023-08-21 DIAGNOSIS — Z87891 Personal history of nicotine dependence: Secondary | ICD-10-CM

## 2023-08-21 DIAGNOSIS — Z888 Allergy status to other drugs, medicaments and biological substances status: Secondary | ICD-10-CM

## 2023-08-21 DIAGNOSIS — Z951 Presence of aortocoronary bypass graft: Secondary | ICD-10-CM

## 2023-08-21 DIAGNOSIS — H401232 Low-tension glaucoma, bilateral, moderate stage: Secondary | ICD-10-CM | POA: Diagnosis present

## 2023-08-21 LAB — I-STAT CHEM 8, ED
BUN: 53 mg/dL — ABNORMAL HIGH (ref 8–23)
Calcium, Ion: 1.12 mmol/L — ABNORMAL LOW (ref 1.15–1.40)
Chloride: 110 mmol/L (ref 98–111)
Creatinine, Ser: 1.6 mg/dL — ABNORMAL HIGH (ref 0.61–1.24)
Glucose, Bld: 146 mg/dL — ABNORMAL HIGH (ref 70–99)
HCT: 31 % — ABNORMAL LOW (ref 39.0–52.0)
Hemoglobin: 10.5 g/dL — ABNORMAL LOW (ref 13.0–17.0)
Potassium: 5.6 mmol/L — ABNORMAL HIGH (ref 3.5–5.1)
Sodium: 137 mmol/L (ref 135–145)
TCO2: 14 mmol/L — ABNORMAL LOW (ref 22–32)

## 2023-08-21 LAB — CBC WITH DIFFERENTIAL/PLATELET
Abs Immature Granulocytes: 0 10*3/uL (ref 0.00–0.07)
Basophils Absolute: 0 10*3/uL (ref 0.0–0.1)
Basophils Relative: 0 %
Eosinophils Absolute: 0 10*3/uL (ref 0.0–0.5)
Eosinophils Relative: 0 %
HCT: 34.7 % — ABNORMAL LOW (ref 39.0–52.0)
Hemoglobin: 11.2 g/dL — ABNORMAL LOW (ref 13.0–17.0)
Lymphocytes Relative: 3 %
Lymphs Abs: 1.1 10*3/uL (ref 0.7–4.0)
MCH: 29.7 pg (ref 26.0–34.0)
MCHC: 32.3 g/dL (ref 30.0–36.0)
MCV: 92 fL (ref 80.0–100.0)
Monocytes Absolute: 0.4 10*3/uL (ref 0.1–1.0)
Monocytes Relative: 1 %
Neutro Abs: 35.9 10*3/uL — ABNORMAL HIGH (ref 1.7–7.7)
Neutrophils Relative %: 96 %
Platelets: 387 10*3/uL (ref 150–400)
RBC: 3.77 MIL/uL — ABNORMAL LOW (ref 4.22–5.81)
RDW: 14.9 % (ref 11.5–15.5)
WBC: 37.4 10*3/uL — ABNORMAL HIGH (ref 4.0–10.5)
nRBC: 0 % (ref 0.0–0.2)
nRBC: 0 /100{WBCs}

## 2023-08-21 LAB — BASIC METABOLIC PANEL
Anion gap: 15 (ref 5–15)
BUN: 56 mg/dL — ABNORMAL HIGH (ref 8–23)
CO2: 15 mmol/L — ABNORMAL LOW (ref 22–32)
Calcium: 9.1 mg/dL (ref 8.9–10.3)
Chloride: 103 mmol/L (ref 98–111)
Creatinine, Ser: 1.88 mg/dL — ABNORMAL HIGH (ref 0.61–1.24)
GFR, Estimated: 32 mL/min — ABNORMAL LOW (ref >60)
Glucose, Bld: 160 mg/dL — ABNORMAL HIGH (ref 70–99)
Potassium: 6 mmol/L — ABNORMAL HIGH (ref 3.5–5.1)
Sodium: 133 mmol/L — ABNORMAL LOW (ref 135–145)

## 2023-08-21 LAB — I-STAT CG4 LACTIC ACID, ED
Lactic Acid, Venous: 6.7 mmol/L (ref 0.5–1.9)
Lactic Acid, Venous: 4.4 mmol/L (ref 0.5–1.9)
Lactic Acid, Venous: 4.2 mmol/L (ref 0.5–1.9)
Lactic Acid, Venous: 3.7 mmol/L (ref 0.5–1.9)

## 2023-08-21 LAB — TROPONIN I (HIGH SENSITIVITY)
Troponin I (High Sensitivity): 45 ng/L — ABNORMAL HIGH (ref <18)
Troponin I (High Sensitivity): 43 ng/L — ABNORMAL HIGH (ref <18)

## 2023-08-21 LAB — HEPATIC FUNCTION PANEL
ALT: 33 U/L (ref 0–44)
AST: 54 U/L — ABNORMAL HIGH (ref 15–41)
Albumin: 3.3 g/dL — ABNORMAL LOW (ref 3.5–5.0)
Alkaline Phosphatase: 189 U/L — ABNORMAL HIGH (ref 38–126)
Bilirubin, Direct: 1.1 mg/dL — ABNORMAL HIGH (ref 0.0–0.2)
Indirect Bilirubin: 1.7 mg/dL — ABNORMAL HIGH (ref 0.3–0.9)
Total Bilirubin: 2.8 mg/dL — ABNORMAL HIGH (ref 0.3–1.2)
Total Protein: 8.4 g/dL — ABNORMAL HIGH (ref 6.5–8.1)

## 2023-08-21 LAB — PROTIME-INR
INR: 4.7 (ref 0.8–1.2)
Prothrombin Time: 44.1 s — ABNORMAL HIGH (ref 11.4–15.2)

## 2023-08-21 MED ORDER — SCOPOLAMINE 1 MG/3DAYS TD PT72
1.0000 | MEDICATED_PATCH | TRANSDERMAL | Status: DC
  Filled 2023-08-21: qty 1

## 2023-08-21 MED ORDER — SODIUM CHLORIDE 0.9 % IV BOLUS
1000.0000 mL | Freq: Once | INTRAVENOUS | Status: AC
  Administered 2023-08-21: 1000 mL via INTRAVENOUS

## 2023-08-21 MED ORDER — LORAZEPAM 2 MG/ML IJ SOLN
1.0000 mg | INTRAMUSCULAR | Status: DC | PRN
  Filled 2023-08-21: qty 1

## 2023-08-21 MED ORDER — MORPHINE SULFATE (PF) 4 MG/ML IV SOLN
4.0000 mg | Freq: Once | INTRAVENOUS | Status: AC
  Administered 2023-08-21: 4 mg via INTRAVENOUS

## 2023-08-21 MED ORDER — IOHEXOL 350 MG/ML SOLN
75.0000 mL | Freq: Once | INTRAVENOUS | Status: AC | PRN
  Administered 2023-08-21: 75 mL via INTRAVENOUS

## 2023-08-21 MED ORDER — FENTANYL CITRATE PF 50 MCG/ML IJ SOSY
50.0000 ug | PREFILLED_SYRINGE | Freq: Once | INTRAMUSCULAR | Status: AC
  Administered 2023-08-21: 50 ug via INTRAVENOUS
  Filled 2023-08-21: qty 1

## 2023-08-21 MED ORDER — FENTANYL CITRATE PF 50 MCG/ML IJ SOSY
50.0000 ug | PREFILLED_SYRINGE | INTRAMUSCULAR | Status: DC | PRN
  Administered 2023-08-21: 50 ug via INTRAVENOUS
  Filled 2023-08-21: qty 1

## 2023-08-21 MED ORDER — SODIUM CHLORIDE 0.9 % IV BOLUS
500.0000 mL | Freq: Once | INTRAVENOUS | Status: AC
  Administered 2023-08-21: 500 mL via INTRAVENOUS

## 2023-08-21 MED ORDER — SODIUM CHLORIDE 0.9 % IV SOLN: INTRAVENOUS | Status: AC

## 2023-08-21 MED ORDER — ONDANSETRON HCL 4 MG/2ML IJ SOLN: 4.0000 mg | Freq: Four times a day (QID) | INTRAMUSCULAR | Status: DC | PRN

## 2023-08-21 MED ORDER — MORPHINE SULFATE (PF) 4 MG/ML IV SOLN
4.0000 mg | INTRAVENOUS | Status: DC | PRN
  Filled 2023-08-21: qty 1

## 2023-08-21 MED ORDER — ONDANSETRON HCL 4 MG/2ML IJ SOLN
4.0000 mg | Freq: Once | INTRAMUSCULAR | Status: AC
  Administered 2023-08-21: 4 mg via INTRAVENOUS
  Filled 2023-08-21: qty 2

## 2023-08-21 MED ORDER — PIPERACILLIN-TAZOBACTAM 3.375 G IVPB
3.3750 g | Freq: Once | INTRAVENOUS | Status: AC
  Administered 2023-08-21: 3.375 g via INTRAVENOUS
  Filled 2023-08-21: qty 50

## 2023-08-21 MED ORDER — MORPHINE SULFATE (PF) 2 MG/ML IV SOLN
2.0000 mg | INTRAVENOUS | Status: DC | PRN
  Administered 2023-08-21: 2 mg via INTRAVENOUS
  Filled 2023-08-21: qty 1

## 2023-08-21 MED ORDER — ONDANSETRON HCL 4 MG PO TABS: 4.0000 mg | ORAL_TABLET | Freq: Four times a day (QID) | ORAL | Status: DC | PRN

## 2023-08-21 NOTE — ED Provider Notes (Signed)
Bilirubin, Direct 1.1 (*)    Indirect Bilirubin 1.7 (*)    All other components within normal limits  PROTIME-INR - Abnormal; Notable for the following components:   Prothrombin Time 44.1 (*)    INR 4.7 (*)    All other components within normal limits  I-STAT CG4 LACTIC ACID, ED - Abnormal; Notable for the following components:   Lactic Acid, Venous 6.7 (*)    All other components within normal limits  I-STAT CG4 LACTIC ACID, ED - Abnormal; Notable for the following components:   Lactic Acid, Venous 4.4 (*)    All other components within normal limits  I-STAT CHEM 8, ED - Abnormal; Notable for the following components:   Potassium 5.6 (*)    BUN 53 (*)    Creatinine, Ser 1.60 (*)    Glucose, Bld 146 (*)    Calcium, Ion 1.12 (*)    TCO2 14 (*)    Hemoglobin 10.5 (*)    HCT 31.0 (*)    All other components within normal limits  I-STAT CG4 LACTIC ACID, ED - Abnormal; Notable for the following components:   Lactic Acid, Venous 4.2 (*)    All other components within normal limits  TROPONIN I (HIGH SENSITIVITY) - Abnormal; Notable for the following components:   Troponin I (High Sensitivity) 45 (*)    All other components within normal limits  TROPONIN I (HIGH SENSITIVITY) - Abnormal; Notable for the following components:   Troponin I (High Sensitivity) 43 (*)    All other components within normal limits  CULTURE, BLOOD (ROUTINE X 2)  CULTURE, BLOOD (ROUTINE X 2)  URINALYSIS, W/ REFLEX TO CULTURE (INFECTION SUSPECTED)  I-STAT CG4 LACTIC ACID, ED    EKG EKG  Interpretation Date/Time:  Sunday 09-19-23 12:05:10 EDT Ventricular Rate:  72 PR Interval:  143 QRS Duration:  157 QT Interval:  456 QTC Calculation: 500 R Axis:   -77  Text Interpretation: Sinus rhythm RBBB and LAFB Wide QRS Nonspecific ST elevations in the inferior leads No STEMI  Confirmed by Ernie Avena ([id number]) on 09-19-23 1:33:10 PM  Radiology DG Chest Portable 1 View  Result Date: 19-Sep-2023 CLINICAL DATA:  Upper abdominal pain EXAM: PORTABLE CHEST 1 VIEW COMPARISON:  10/05/2021 FINDINGS: 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 Both lungs are clear. The visualized skeletal structures are unremarkable. IMPRESSION: No acute abnormality of the lungs in AP portable projection. Electronically Signed   By: Jearld Lesch M.D.   On: 09-19-2023 13:25    Procedures Procedures    Medications Ordered in ED Medications  0.9 %  sodium chloride infusion ( Intravenous New Bag/Given Sep 19, 2023 1724)  fentaNYL (SUBLIMAZE) injection 50 mcg (50 mcg Intravenous Given 09-19-23 1723)  fentaNYL (SUBLIMAZE) injection 50 mcg (50 mcg Intravenous Given 09-19-23 1210)  sodium chloride 0.9 % bolus 500 mL (0 mLs Intravenous Stopped September 19, 2023 1320)  ondansetron (ZOFRAN) injection 4 mg (4 mg Intravenous Given 09/19/23 1210)  sodium chloride 0.9 % bolus 1,000 mL (0 mLs Intravenous Stopped 09/19/2023 1706)  piperacillin-tazobactam (ZOSYN) IVPB 3.375 g (0 g Intravenous Stopped 2023/09/19 1642)  sodium chloride 0.9 % bolus 1,000 mL (0 mLs Intravenous Stopped 09-19-2023 1532)  fentaNYL (SUBLIMAZE) injection 50 mcg (50 mcg Intravenous Given 19-Sep-2023 1332)  iohexol (OMNIPAQUE) 350 MG/ML injection 75 mL (75 mLs Intravenous Contrast Given 09-19-2023 1516)    ED Course/ Medical Decision Making/ A&P    Patient seen and examined. History obtained directly from patient. Work-up including labs, imaging, EKG  daughter by telephone.  Patient has voiced wishes to be comfortable and is aware that this is likely a terminal event.  I have spoken with Dr. Cliffton Asters of general surgery who has seen patient and confirms this.  He is going to contact family now.  Dr. Mikeal Hawthorne of hospitalist will see patient.  Aware of discussions at this point.  Aware that this will be an admission for palliative care.  CRITICAL CARE Performed by: Renne Crigler PA-C Total critical care time: 60 minutes Critical care time was exclusive of separately billable procedures and treating other patients. Critical care was necessary to treat or prevent imminent  or life-threatening deterioration. Critical care was time spent personally by me on the following activities: development of treatment plan with patient and/or surrogate as well as nursing, discussions with consultants, evaluation of patient's response to treatment, examination of patient, obtaining history from patient or surrogate, ordering and performing treatments and interventions, ordering and review of laboratory studies, ordering and review of radiographic studies, pulse oximetry and re-evaluation of patient's condition.                                  Medical Decision Making Amount and/or Complexity of Data Reviewed Labs: ordered. Radiology: ordered.  Risk Prescription drug management. Decision regarding hospitalization.   For this patient's complaint of abdominal pain, the following conditions were considered on the differential diagnosis: gastritis/PUD, enteritis/duodenitis, appendicitis, cholelithiasis/cholecystitis, cholangitis, pancreatitis, ruptured viscus, colitis, diverticulitis, small/large bowel obstruction, proctitis, cystitis, pyelonephritis, ureteral colic, aortic dissection, aortic aneurysm. Atypical chest etiologies were also considered including ACS, PE, and pneumonia.          Final Clinical Impression(s) / ED Diagnoses Final diagnoses:  Mesenteric ischemia (HCC)  Gangrenous cholecystitis  Lactic acidosis    Rx / DC Orders ED Discharge Orders     None         Renne Crigler, Cordelia Poche September 01, 2023 Rickey Primus    Ernie Avena, MD 01-Sep-2023 [phone]  daughter by telephone.  Patient has voiced wishes to be comfortable and is aware that this is likely a terminal event.  I have spoken with Dr. Cliffton Asters of general surgery who has seen patient and confirms this.  He is going to contact family now.  Dr. Mikeal Hawthorne of hospitalist will see patient.  Aware of discussions at this point.  Aware that this will be an admission for palliative care.  CRITICAL CARE Performed by: Renne Crigler PA-C Total critical care time: 60 minutes Critical care time was exclusive of separately billable procedures and treating other patients. Critical care was necessary to treat or prevent imminent  or life-threatening deterioration. Critical care was time spent personally by me on the following activities: development of treatment plan with patient and/or surrogate as well as nursing, discussions with consultants, evaluation of patient's response to treatment, examination of patient, obtaining history from patient or surrogate, ordering and performing treatments and interventions, ordering and review of laboratory studies, ordering and review of radiographic studies, pulse oximetry and re-evaluation of patient's condition.                                  Medical Decision Making Amount and/or Complexity of Data Reviewed Labs: ordered. Radiology: ordered.  Risk Prescription drug management. Decision regarding hospitalization.   For this patient's complaint of abdominal pain, the following conditions were considered on the differential diagnosis: gastritis/PUD, enteritis/duodenitis, appendicitis, cholelithiasis/cholecystitis, cholangitis, pancreatitis, ruptured viscus, colitis, diverticulitis, small/large bowel obstruction, proctitis, cystitis, pyelonephritis, ureteral colic, aortic dissection, aortic aneurysm. Atypical chest etiologies were also considered including ACS, PE, and pneumonia.          Final Clinical Impression(s) / ED Diagnoses Final diagnoses:  Mesenteric ischemia (HCC)  Gangrenous cholecystitis  Lactic acidosis    Rx / DC Orders ED Discharge Orders     None         Renne Crigler, Cordelia Poche September 01, 2023 Rickey Primus    Ernie Avena, MD 01-Sep-2023 [phone]  Bilirubin, Direct 1.1 (*)    Indirect Bilirubin 1.7 (*)    All other components within normal limits  PROTIME-INR - Abnormal; Notable for the following components:   Prothrombin Time 44.1 (*)    INR 4.7 (*)    All other components within normal limits  I-STAT CG4 LACTIC ACID, ED - Abnormal; Notable for the following components:   Lactic Acid, Venous 6.7 (*)    All other components within normal limits  I-STAT CG4 LACTIC ACID, ED - Abnormal; Notable for the following components:   Lactic Acid, Venous 4.4 (*)    All other components within normal limits  I-STAT CHEM 8, ED - Abnormal; Notable for the following components:   Potassium 5.6 (*)    BUN 53 (*)    Creatinine, Ser 1.60 (*)    Glucose, Bld 146 (*)    Calcium, Ion 1.12 (*)    TCO2 14 (*)    Hemoglobin 10.5 (*)    HCT 31.0 (*)    All other components within normal limits  I-STAT CG4 LACTIC ACID, ED - Abnormal; Notable for the following components:   Lactic Acid, Venous 4.2 (*)    All other components within normal limits  TROPONIN I (HIGH SENSITIVITY) - Abnormal; Notable for the following components:   Troponin I (High Sensitivity) 45 (*)    All other components within normal limits  TROPONIN I (HIGH SENSITIVITY) - Abnormal; Notable for the following components:   Troponin I (High Sensitivity) 43 (*)    All other components within normal limits  CULTURE, BLOOD (ROUTINE X 2)  CULTURE, BLOOD (ROUTINE X 2)  URINALYSIS, W/ REFLEX TO CULTURE (INFECTION SUSPECTED)  I-STAT CG4 LACTIC ACID, ED    EKG EKG  Interpretation Date/Time:  Sunday 09-19-23 12:05:10 EDT Ventricular Rate:  72 PR Interval:  143 QRS Duration:  157 QT Interval:  456 QTC Calculation: 500 R Axis:   -77  Text Interpretation: Sinus rhythm RBBB and LAFB Wide QRS Nonspecific ST elevations in the inferior leads No STEMI  Confirmed by Ernie Avena ([id number]) on 09-19-23 1:33:10 PM  Radiology DG Chest Portable 1 View  Result Date: 19-Sep-2023 CLINICAL DATA:  Upper abdominal pain EXAM: PORTABLE CHEST 1 VIEW COMPARISON:  10/05/2021 FINDINGS: 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 Both lungs are clear. The visualized skeletal structures are unremarkable. IMPRESSION: No acute abnormality of the lungs in AP portable projection. Electronically Signed   By: Jearld Lesch M.D.   On: 09-19-2023 13:25    Procedures Procedures    Medications Ordered in ED Medications  0.9 %  sodium chloride infusion ( Intravenous New Bag/Given Sep 19, 2023 1724)  fentaNYL (SUBLIMAZE) injection 50 mcg (50 mcg Intravenous Given 09-19-23 1723)  fentaNYL (SUBLIMAZE) injection 50 mcg (50 mcg Intravenous Given 09-19-23 1210)  sodium chloride 0.9 % bolus 500 mL (0 mLs Intravenous Stopped September 19, 2023 1320)  ondansetron (ZOFRAN) injection 4 mg (4 mg Intravenous Given 09/19/23 1210)  sodium chloride 0.9 % bolus 1,000 mL (0 mLs Intravenous Stopped 09/19/2023 1706)  piperacillin-tazobactam (ZOSYN) IVPB 3.375 g (0 g Intravenous Stopped 2023/09/19 1642)  sodium chloride 0.9 % bolus 1,000 mL (0 mLs Intravenous Stopped 09-19-2023 1532)  fentaNYL (SUBLIMAZE) injection 50 mcg (50 mcg Intravenous Given 19-Sep-2023 1332)  iohexol (OMNIPAQUE) 350 MG/ML injection 75 mL (75 mLs Intravenous Contrast Given 09-19-2023 1516)    ED Course/ Medical Decision Making/ A&P    Patient seen and examined. History obtained directly from patient. Work-up including labs, imaging, EKG  daughter by telephone.  Patient has voiced wishes to be comfortable and is aware that this is likely a terminal event.  I have spoken with Dr. Cliffton Asters of general surgery who has seen patient and confirms this.  He is going to contact family now.  Dr. Mikeal Hawthorne of hospitalist will see patient.  Aware of discussions at this point.  Aware that this will be an admission for palliative care.  CRITICAL CARE Performed by: Renne Crigler PA-C Total critical care time: 60 minutes Critical care time was exclusive of separately billable procedures and treating other patients. Critical care was necessary to treat or prevent imminent  or life-threatening deterioration. Critical care was time spent personally by me on the following activities: development of treatment plan with patient and/or surrogate as well as nursing, discussions with consultants, evaluation of patient's response to treatment, examination of patient, obtaining history from patient or surrogate, ordering and performing treatments and interventions, ordering and review of laboratory studies, ordering and review of radiographic studies, pulse oximetry and re-evaluation of patient's condition.                                  Medical Decision Making Amount and/or Complexity of Data Reviewed Labs: ordered. Radiology: ordered.  Risk Prescription drug management. Decision regarding hospitalization.   For this patient's complaint of abdominal pain, the following conditions were considered on the differential diagnosis: gastritis/PUD, enteritis/duodenitis, appendicitis, cholelithiasis/cholecystitis, cholangitis, pancreatitis, ruptured viscus, colitis, diverticulitis, small/large bowel obstruction, proctitis, cystitis, pyelonephritis, ureteral colic, aortic dissection, aortic aneurysm. Atypical chest etiologies were also considered including ACS, PE, and pneumonia.          Final Clinical Impression(s) / ED Diagnoses Final diagnoses:  Mesenteric ischemia (HCC)  Gangrenous cholecystitis  Lactic acidosis    Rx / DC Orders ED Discharge Orders     None         Renne Crigler, Cordelia Poche September 01, 2023 Rickey Primus    Ernie Avena, MD 01-Sep-2023 [phone]

## 2023-08-21 NOTE — ED Notes (Signed)
X-ray at bedside

## 2023-08-21 NOTE — H&P (Signed)
unhealed vertebral compression fracture with approximate 20% loss of height, new since previous. Mild lumbar levoscoliosis with multilevel spondylitic changes. IMPRESSION: 1. 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 2. 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 3. Pneumatosis in mid abdominal small bowel loops with mesenteric venous gas, and edema in the adjacent mesentery, suggesting severe small bowel ischemia. Recommend surgical consultation. 4. Patchy mucosal enhancement of gallbladder, containing gas, suggesting gangrenous cholecystitis. Critical Value/emergent results were called by telephone at the time of interpretation on 2023/09/19 at 5:21 pm to provider [facility], who verbally acknowledged these results. 5. Small volume ascites. 6. Splenic infarct. 7. T11 vertebral compression fracture, new since previous. 8. Patchy airspace opacities in the right middle lobe, and posteriorly in the lung bases right greater than left. 9.  Aortic Atherosclerosis (ICD10-I70.0). Electronically Signed   By: Corlis Leak M.D.   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 Both lungs are clear. The visualized skeletal structures are unremarkable. IMPRESSION: No acute abnormality of the lungs in AP portable projection. Electronically Signed   By: Jearld Lesch M.D.   On: 09/19/23 13:25    ROS - all of the below systems have been reviewed with the patient and positives are indicated with bold text General: chills, fever or night sweats Eyes: blurry vision or double vision ENT: epistaxis or sore throat Allergy/Immunology: itchy/watery eyes or nasal congestion Hematologic/Lymphatic: bleeding problems, blood clots or swollen lymph nodes Endocrine: temperature intolerance or unexpected weight changes Breast: new or changing breast lumps or nipple discharge Resp: cough, shortness of breath, or wheezing CV: chest pain or dyspnea on exertion GI: as per HPI GU: dysuria, trouble voiding, or hematuria MSK: joint  pain or joint stiffness Neuro: TIA or stroke symptoms Derm: pruritus and skin lesion changes Psych: anxiety and depression  PE Blood pressure 132/68, pulse 86, temperature 97.6 F (36.4 C), temperature source Oral, resp. rate (!) 30, height 5\' 7"  (1.702 m), weight 88.4 kg, SpO2 95%. Constitutional: NAD; conversant Eyes: Moist conjunctiva Lungs: Normal respiratory effort; crackles diffusely + tracheal secretions CV: RRR;  GI: Abd distended, tense with rebound and guarding Psychiatric: Appropriate affect  Results for orders placed or performed during the hospital encounter of 09/19/2023 (from the past 48 hour(s))  CBC with Differential     Status: Abnormal   Collection Time: 09/19/2023 12:12 PM  Result Value Ref Range   WBC 37.4 (H) 4.0 - 10.5 K/uL   RBC 3.77 (L) 4.22 - 5.81 MIL/uL   Hemoglobin 11.2 (L) 13.0 - 17.0 g/dL   HCT 09.8 (L) 11.9 - 14.7 %   MCV 92.0 80.0 - 100.0 fL   MCH 29.7 26.0 - 34.0 pg   MCHC 32.3 30.0 - 36.0 g/dL   RDW 82.9 56.2 - 13.0 %   Platelets 387 150 - 400 K/uL   nRBC 0.0 0.0 - 0.2 %   Neutrophils Relative % 96 %   Neutro Abs 35.9 (H) 1.7 - 7.7 K/uL   Lymphocytes Relative 3 %   Lymphs Abs 1.1 0.7 - 4.0 K/uL   Monocytes Relative 1 %   Monocytes Absolute 0.4 0.1 - 1.0 K/uL   Eosinophils Relative 0 %   Eosinophils Absolute 0.0 0.0 - 0.5 K/uL   Basophils Relative 0 %   Basophils  unhealed vertebral compression fracture with approximate 20% loss of height, new since previous. Mild lumbar levoscoliosis with multilevel spondylitic changes. IMPRESSION: 1. 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 2. 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 3. Pneumatosis in mid abdominal small bowel loops with mesenteric venous gas, and edema in the adjacent mesentery, suggesting severe small bowel ischemia. Recommend surgical consultation. 4. Patchy mucosal enhancement of gallbladder, containing gas, suggesting gangrenous cholecystitis. Critical Value/emergent results were called by telephone at the time of interpretation on 2023/09/19 at 5:21 pm to provider [facility], who verbally acknowledged these results. 5. Small volume ascites. 6. Splenic infarct. 7. T11 vertebral compression fracture, new since previous. 8. Patchy airspace opacities in the right middle lobe, and posteriorly in the lung bases right greater than left. 9.  Aortic Atherosclerosis (ICD10-I70.0). Electronically Signed   By: Corlis Leak M.D.   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 Both lungs are clear. The visualized skeletal structures are unremarkable. IMPRESSION: No acute abnormality of the lungs in AP portable projection. Electronically Signed   By: Jearld Lesch M.D.   On: 09/19/23 13:25    ROS - all of the below systems have been reviewed with the patient and positives are indicated with bold text General: chills, fever or night sweats Eyes: blurry vision or double vision ENT: epistaxis or sore throat Allergy/Immunology: itchy/watery eyes or nasal congestion Hematologic/Lymphatic: bleeding problems, blood clots or swollen lymph nodes Endocrine: temperature intolerance or unexpected weight changes Breast: new or changing breast lumps or nipple discharge Resp: cough, shortness of breath, or wheezing CV: chest pain or dyspnea on exertion GI: as per HPI GU: dysuria, trouble voiding, or hematuria MSK: joint  pain or joint stiffness Neuro: TIA or stroke symptoms Derm: pruritus and skin lesion changes Psych: anxiety and depression  PE Blood pressure 132/68, pulse 86, temperature 97.6 F (36.4 C), temperature source Oral, resp. rate (!) 30, height 5\' 7"  (1.702 m), weight 88.4 kg, SpO2 95%. Constitutional: NAD; conversant Eyes: Moist conjunctiva Lungs: Normal respiratory effort; crackles diffusely + tracheal secretions CV: RRR;  GI: Abd distended, tense with rebound and guarding Psychiatric: Appropriate affect  Results for orders placed or performed during the hospital encounter of 09/19/2023 (from the past 48 hour(s))  CBC with Differential     Status: Abnormal   Collection Time: 09/19/2023 12:12 PM  Result Value Ref Range   WBC 37.4 (H) 4.0 - 10.5 K/uL   RBC 3.77 (L) 4.22 - 5.81 MIL/uL   Hemoglobin 11.2 (L) 13.0 - 17.0 g/dL   HCT 09.8 (L) 11.9 - 14.7 %   MCV 92.0 80.0 - 100.0 fL   MCH 29.7 26.0 - 34.0 pg   MCHC 32.3 30.0 - 36.0 g/dL   RDW 82.9 56.2 - 13.0 %   Platelets 387 150 - 400 K/uL   nRBC 0.0 0.0 - 0.2 %   Neutrophils Relative % 96 %   Neutro Abs 35.9 (H) 1.7 - 7.7 K/uL   Lymphocytes Relative 3 %   Lymphs Abs 1.1 0.7 - 4.0 K/uL   Monocytes Relative 1 %   Monocytes Absolute 0.4 0.1 - 1.0 K/uL   Eosinophils Relative 0 %   Eosinophils Absolute 0.0 0.0 - 0.5 K/uL   Basophils Relative 0 %   Basophils  unhealed vertebral compression fracture with approximate 20% loss of height, new since previous. Mild lumbar levoscoliosis with multilevel spondylitic changes. IMPRESSION: 1. 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 2. 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 3. Pneumatosis in mid abdominal small bowel loops with mesenteric venous gas, and edema in the adjacent mesentery, suggesting severe small bowel ischemia. Recommend surgical consultation. 4. Patchy mucosal enhancement of gallbladder, containing gas, suggesting gangrenous cholecystitis. Critical Value/emergent results were called by telephone at the time of interpretation on 2023/09/19 at 5:21 pm to provider [facility], who verbally acknowledged these results. 5. Small volume ascites. 6. Splenic infarct. 7. T11 vertebral compression fracture, new since previous. 8. Patchy airspace opacities in the right middle lobe, and posteriorly in the lung bases right greater than left. 9.  Aortic Atherosclerosis (ICD10-I70.0). Electronically Signed   By: Corlis Leak M.D.   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 Both lungs are clear. The visualized skeletal structures are unremarkable. IMPRESSION: No acute abnormality of the lungs in AP portable projection. Electronically Signed   By: Jearld Lesch M.D.   On: 09/19/23 13:25    ROS - all of the below systems have been reviewed with the patient and positives are indicated with bold text General: chills, fever or night sweats Eyes: blurry vision or double vision ENT: epistaxis or sore throat Allergy/Immunology: itchy/watery eyes or nasal congestion Hematologic/Lymphatic: bleeding problems, blood clots or swollen lymph nodes Endocrine: temperature intolerance or unexpected weight changes Breast: new or changing breast lumps or nipple discharge Resp: cough, shortness of breath, or wheezing CV: chest pain or dyspnea on exertion GI: as per HPI GU: dysuria, trouble voiding, or hematuria MSK: joint  pain or joint stiffness Neuro: TIA or stroke symptoms Derm: pruritus and skin lesion changes Psych: anxiety and depression  PE Blood pressure 132/68, pulse 86, temperature 97.6 F (36.4 C), temperature source Oral, resp. rate (!) 30, height 5\' 7"  (1.702 m), weight 88.4 kg, SpO2 95%. Constitutional: NAD; conversant Eyes: Moist conjunctiva Lungs: Normal respiratory effort; crackles diffusely + tracheal secretions CV: RRR;  GI: Abd distended, tense with rebound and guarding Psychiatric: Appropriate affect  Results for orders placed or performed during the hospital encounter of 09/19/2023 (from the past 48 hour(s))  CBC with Differential     Status: Abnormal   Collection Time: 09/19/2023 12:12 PM  Result Value Ref Range   WBC 37.4 (H) 4.0 - 10.5 K/uL   RBC 3.77 (L) 4.22 - 5.81 MIL/uL   Hemoglobin 11.2 (L) 13.0 - 17.0 g/dL   HCT 09.8 (L) 11.9 - 14.7 %   MCV 92.0 80.0 - 100.0 fL   MCH 29.7 26.0 - 34.0 pg   MCHC 32.3 30.0 - 36.0 g/dL   RDW 82.9 56.2 - 13.0 %   Platelets 387 150 - 400 K/uL   nRBC 0.0 0.0 - 0.2 %   Neutrophils Relative % 96 %   Neutro Abs 35.9 (H) 1.7 - 7.7 K/uL   Lymphocytes Relative 3 %   Lymphs Abs 1.1 0.7 - 4.0 K/uL   Monocytes Relative 1 %   Monocytes Absolute 0.4 0.1 - 1.0 K/uL   Eosinophils Relative 0 %   Eosinophils Absolute 0.0 0.0 - 0.5 K/uL   Basophils Relative 0 %   Basophils  Absolute 0.0 0.0 - 0.1 K/uL   nRBC 0 0 /100 WBC   Abs Immature Granulocytes 0.00 0.00 - 0.07 K/uL    Comment: Performed at [facility], [address] [place], [place] [address]  Troponin I (High Sensitivity)     Status: Abnormal   Collection Time: 2023-09-18 12:12 PM  Result Value Ref Range   Troponin I (High Sensitivity) 45 (H) <18 ng/L    Comment: (NOTE) Elevated high sensitivity troponin I (hsTnI) values and significant  changes across serial measurements may suggest ACS but many other  chronic and acute conditions are known to elevate hsTnI results.  Refer to the  "Links" section for chest pain algorithms and additional  guidance. Performed at [facility], [address] [place], [place] [address]   I-Stat Lactic Acid     Status: Abnormal   Collection Time: 18-Sep-2023 12:48 PM  Result Value Ref Range   Lactic Acid, Venous 6.7 (HH) 0.5 - 1.9 mmol/L   Comment NOTIFIED PHYSICIAN   Basic metabolic panel     Status: Abnormal   Collection Time: 2023/09/18  1:13 PM  Result Value Ref Range   Sodium 133 (L) 135 - 145 mmol/L   Potassium 6.0 (H) 3.5 - 5.1 mmol/L   Chloride 103 98 - 111 mmol/L   CO2 15 (L) 22 - 32 mmol/L   Glucose, Bld 160 (H) 70 - 99 mg/dL    Comment: Glucose reference range applies only to samples taken after fasting for at least 8 hours.   BUN 56 (H) 8 - 23 mg/dL   Creatinine, Ser 0.98 (H) 0.61 - 1.24 mg/dL   Calcium 9.1 8.9 - 11.9 mg/dL   GFR, Estimated 32 (L) >60 mL/min    Comment: (NOTE) Calculated using the CKD-EPI Creatinine Equation (2021)    Anion gap 15 5 - 15    Comment: Performed at [facility], [address] [place], [place] [address]  Hepatic function panel     Status: Abnormal   Collection Time: September 18, 2023  1:13 PM  Result Value Ref Range   Total Protein 8.4 (H) 6.5 - 8.1 g/dL   Albumin 3.3 (L) 3.5 - 5.0 g/dL   AST 54 (H) 15 - 41 U/L   ALT 33 0 - 44 U/L   Alkaline Phosphatase 189 (H) 38 - 126 U/L   Total Bilirubin 2.8 (H) 0.3 - 1.2 mg/dL   Bilirubin, Direct 1.1 (H) 0.0 - 0.2 mg/dL   Indirect Bilirubin 1.7 (H) 0.3 - 0.9 mg/dL    Comment: Performed at [facility], [address], [place], [place] [address]  Protime-INR     Status: Abnormal   Collection Time: 09/18/23  1:13 PM  Result Value Ref Range   Prothrombin Time 44.1 (H) 11.4 - 15.2 seconds   INR 4.7 (HH) 0.8 - 1.2    Comment: REPEATED TO VERIFY CRITICAL RESULT CALLED TO, READ BACK BY AND VERIFIED WITH: CASSANDRA KHOURI,RN AT 1444 10/06/20204 BY ZBEECH. CORRECT YEAR IS 2024 (NOTE) INR goal varies based on device and disease  states. Performed at [facility], [address] [place], [place] [address] CORRECTED ON 09/18/2023 AT 1755: PREVIOUSLY REPORTED AS 4.7 REPEATED TO VERIFY CRITICAL RESULT CALLED TO, READ BACK BY AND VERIFIED WITH: CASSANDRA KHOURI,RN AT 1444 10/06/20204 BY ZBEECH.   Troponin I (High Sensitivity)     Status: Abnormal   Collection Time: 2023-09-18  2:27 PM  Result Value Ref Range  Urinary retention 07/18/2018   Vitamin D deficiency, unspecified 08/16/2017   Vomiting and diarrhea 10/06/2021    Past Surgical History:  Procedure Laterality Date   AORTIC VALVE REPLACEMENT     CORONARY ARTERY BYPASS GRAFT     PROSTATE ABLATION      TONSILLECTOMY      Family History  Problem Relation Age of Onset   Hypertension Neg Hx    Diabetes Neg Hx    Cancer Neg Hx    Heart disease Neg Hx     Social:  reports that he has quit smoking. He has never used smokeless tobacco. He reports that he does not drink alcohol and does not use drugs.  Allergies:  Allergies  Allergen Reactions   Atorvastatin Calcium     Other Reaction(s): Other (See Comments)  Developed myopathy., Muscle pain, Myopathy   Atorvastatin Calcium Other (See Comments)    Developed myopathy.     Dorzolamide Other (See Comments)    Follicula     Brimonidine Other (See Comments)    Red and burning eyes   Brinzolamide-Brimonidine Swelling    Redness in eyes - reaction to Simbrinza   Rotigotine Other (See Comments)    Falling asleep uncontrollably - reaction to Neupro   Tamsulosin Other (See Comments)    Unknown reaction (Flomax)   Tetracyclines & Related    Zolpidem Other (See Comments)    Pt. states that he possibly had a neuro reaction.   Dutasteride Rash    Reaction to Avodart   Vancomycin Rash    Medications: I have reviewed the patient's current medications.  Results for orders placed or performed during the hospital encounter of August 22, 2023 (from the past 48 hour(s))  CBC with Differential     Status: Abnormal   Collection Time: 08/31/2023 12:12 PM  Result Value Ref Range   WBC 37.4 (H) 4.0 - 10.5 K/uL   RBC 3.77 (L) 4.22 - 5.81 MIL/uL   Hemoglobin 11.2 (L) 13.0 - 17.0 g/dL   HCT 19.1 (L) 47.8 - 29.5 %   MCV 92.0 80.0 - 100.0 fL   MCH 29.7 26.0 - 34.0 pg   MCHC 32.3 30.0 - 36.0 g/dL   RDW 62.1 30.8 - 65.7 %   Platelets 387 150 - 400 K/uL   nRBC 0.0 0.0 - 0.2 %   Neutrophils Relative % 96 %   Neutro Abs 35.9 (H) 1.7 - 7.7 K/uL   Lymphocytes Relative 3 %   Lymphs Abs 1.1 0.7 - 4.0 K/uL   Monocytes Relative 1 %   Monocytes Absolute 0.4 0.1 - 1.0 K/uL   Eosinophils Relative 0 %   Eosinophils Absolute 0.0 0.0 - 0.5 K/uL   Basophils  Relative 0 %   Basophils Absolute 0.0 0.0 - 0.1 K/uL   nRBC 0 0 /100 WBC   Abs Immature Granulocytes 0.00 0.00 - 0.07 K/uL    Comment: Performed at [facility], [address] [place], [place] [address]  Troponin I (High Sensitivity)     Status: Abnormal   Collection Time: 08/17/2023 12:12 PM  Result Value Ref Range   Troponin I (High Sensitivity) 45 (H) <18 ng/L    Comment: (NOTE) Elevated high sensitivity troponin I (hsTnI) values and significant  changes across serial measurements may suggest ACS but many other  chronic and acute conditions are known to elevate hsTnI results.  Refer to the "Links" section for chest pain algorithms and additional  guidance. Performed at [facility],  Urinary retention 07/18/2018   Vitamin D deficiency, unspecified 08/16/2017   Vomiting and diarrhea 10/06/2021    Past Surgical History:  Procedure Laterality Date   AORTIC VALVE REPLACEMENT     CORONARY ARTERY BYPASS GRAFT     PROSTATE ABLATION      TONSILLECTOMY      Family History  Problem Relation Age of Onset   Hypertension Neg Hx    Diabetes Neg Hx    Cancer Neg Hx    Heart disease Neg Hx     Social:  reports that he has quit smoking. He has never used smokeless tobacco. He reports that he does not drink alcohol and does not use drugs.  Allergies:  Allergies  Allergen Reactions   Atorvastatin Calcium     Other Reaction(s): Other (See Comments)  Developed myopathy., Muscle pain, Myopathy   Atorvastatin Calcium Other (See Comments)    Developed myopathy.     Dorzolamide Other (See Comments)    Follicula     Brimonidine Other (See Comments)    Red and burning eyes   Brinzolamide-Brimonidine Swelling    Redness in eyes - reaction to Simbrinza   Rotigotine Other (See Comments)    Falling asleep uncontrollably - reaction to Neupro   Tamsulosin Other (See Comments)    Unknown reaction (Flomax)   Tetracyclines & Related    Zolpidem Other (See Comments)    Pt. states that he possibly had a neuro reaction.   Dutasteride Rash    Reaction to Avodart   Vancomycin Rash    Medications: I have reviewed the patient's current medications.  Results for orders placed or performed during the hospital encounter of August 22, 2023 (from the past 48 hour(s))  CBC with Differential     Status: Abnormal   Collection Time: 08/31/2023 12:12 PM  Result Value Ref Range   WBC 37.4 (H) 4.0 - 10.5 K/uL   RBC 3.77 (L) 4.22 - 5.81 MIL/uL   Hemoglobin 11.2 (L) 13.0 - 17.0 g/dL   HCT 19.1 (L) 47.8 - 29.5 %   MCV 92.0 80.0 - 100.0 fL   MCH 29.7 26.0 - 34.0 pg   MCHC 32.3 30.0 - 36.0 g/dL   RDW 62.1 30.8 - 65.7 %   Platelets 387 150 - 400 K/uL   nRBC 0.0 0.0 - 0.2 %   Neutrophils Relative % 96 %   Neutro Abs 35.9 (H) 1.7 - 7.7 K/uL   Lymphocytes Relative 3 %   Lymphs Abs 1.1 0.7 - 4.0 K/uL   Monocytes Relative 1 %   Monocytes Absolute 0.4 0.1 - 1.0 K/uL   Eosinophils Relative 0 %   Eosinophils Absolute 0.0 0.0 - 0.5 K/uL   Basophils  Relative 0 %   Basophils Absolute 0.0 0.0 - 0.1 K/uL   nRBC 0 0 /100 WBC   Abs Immature Granulocytes 0.00 0.00 - 0.07 K/uL    Comment: Performed at [facility], [address] [place], [place] [address]  Troponin I (High Sensitivity)     Status: Abnormal   Collection Time: 08/17/2023 12:12 PM  Result Value Ref Range   Troponin I (High Sensitivity) 45 (H) <18 ng/L    Comment: (NOTE) Elevated high sensitivity troponin I (hsTnI) values and significant  changes across serial measurements may suggest ACS but many other  chronic and acute conditions are known to elevate hsTnI results.  Refer to the "Links" section for chest pain algorithms and additional  guidance. Performed at [facility],  unhealed vertebral compression fracture with approximate 20% loss of height, new since previous. Mild lumbar levoscoliosis with multilevel spondylitic changes. IMPRESSION: 1. 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 2. 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 3. Pneumatosis in mid abdominal small bowel loops with mesenteric venous gas, and edema in the adjacent mesentery, suggesting severe small bowel ischemia. Recommend surgical consultation. 4. Patchy mucosal enhancement of gallbladder, containing gas, suggesting gangrenous cholecystitis. Critical Value/emergent results were called by telephone at the time of interpretation on 2023/09/19 at 5:21 pm to provider [facility], who verbally acknowledged these results. 5. Small volume ascites. 6. Splenic infarct. 7. T11 vertebral compression fracture, new since previous. 8. Patchy airspace opacities in the right middle lobe, and posteriorly in the lung bases right greater than left. 9.  Aortic Atherosclerosis (ICD10-I70.0). Electronically Signed   By: Corlis Leak M.D.   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 Both lungs are clear. The visualized skeletal structures are unremarkable. IMPRESSION: No acute abnormality of the lungs in AP portable projection. Electronically Signed   By: Jearld Lesch M.D.   On: 09/19/23 13:25    ROS - all of the below systems have been reviewed with the patient and positives are indicated with bold text General: chills, fever or night sweats Eyes: blurry vision or double vision ENT: epistaxis or sore throat Allergy/Immunology: itchy/watery eyes or nasal congestion Hematologic/Lymphatic: bleeding problems, blood clots or swollen lymph nodes Endocrine: temperature intolerance or unexpected weight changes Breast: new or changing breast lumps or nipple discharge Resp: cough, shortness of breath, or wheezing CV: chest pain or dyspnea on exertion GI: as per HPI GU: dysuria, trouble voiding, or hematuria MSK: joint  pain or joint stiffness Neuro: TIA or stroke symptoms Derm: pruritus and skin lesion changes Psych: anxiety and depression  PE Blood pressure 132/68, pulse 86, temperature 97.6 F (36.4 C), temperature source Oral, resp. rate (!) 30, height 5\' 7"  (1.702 m), weight 88.4 kg, SpO2 95%. Constitutional: NAD; conversant Eyes: Moist conjunctiva Lungs: Normal respiratory effort; crackles diffusely + tracheal secretions CV: RRR;  GI: Abd distended, tense with rebound and guarding Psychiatric: Appropriate affect  Results for orders placed or performed during the hospital encounter of 09/19/2023 (from the past 48 hour(s))  CBC with Differential     Status: Abnormal   Collection Time: 09/19/2023 12:12 PM  Result Value Ref Range   WBC 37.4 (H) 4.0 - 10.5 K/uL   RBC 3.77 (L) 4.22 - 5.81 MIL/uL   Hemoglobin 11.2 (L) 13.0 - 17.0 g/dL   HCT 09.8 (L) 11.9 - 14.7 %   MCV 92.0 80.0 - 100.0 fL   MCH 29.7 26.0 - 34.0 pg   MCHC 32.3 30.0 - 36.0 g/dL   RDW 82.9 56.2 - 13.0 %   Platelets 387 150 - 400 K/uL   nRBC 0.0 0.0 - 0.2 %   Neutrophils Relative % 96 %   Neutro Abs 35.9 (H) 1.7 - 7.7 K/uL   Lymphocytes Relative 3 %   Lymphs Abs 1.1 0.7 - 4.0 K/uL   Monocytes Relative 1 %   Monocytes Absolute 0.4 0.1 - 1.0 K/uL   Eosinophils Relative 0 %   Eosinophils Absolute 0.0 0.0 - 0.5 K/uL   Basophils Relative 0 %   Basophils  unhealed vertebral compression fracture with approximate 20% loss of height, new since previous. Mild lumbar levoscoliosis with multilevel spondylitic changes. IMPRESSION: 1. 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 2. 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 3. Pneumatosis in mid abdominal small bowel loops with mesenteric venous gas, and edema in the adjacent mesentery, suggesting severe small bowel ischemia. Recommend surgical consultation. 4. Patchy mucosal enhancement of gallbladder, containing gas, suggesting gangrenous cholecystitis. Critical Value/emergent results were called by telephone at the time of interpretation on 2023/09/19 at 5:21 pm to provider [facility], who verbally acknowledged these results. 5. Small volume ascites. 6. Splenic infarct. 7. T11 vertebral compression fracture, new since previous. 8. Patchy airspace opacities in the right middle lobe, and posteriorly in the lung bases right greater than left. 9.  Aortic Atherosclerosis (ICD10-I70.0). Electronically Signed   By: Corlis Leak M.D.   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 Both lungs are clear. The visualized skeletal structures are unremarkable. IMPRESSION: No acute abnormality of the lungs in AP portable projection. Electronically Signed   By: Jearld Lesch M.D.   On: 09/19/23 13:25    ROS - all of the below systems have been reviewed with the patient and positives are indicated with bold text General: chills, fever or night sweats Eyes: blurry vision or double vision ENT: epistaxis or sore throat Allergy/Immunology: itchy/watery eyes or nasal congestion Hematologic/Lymphatic: bleeding problems, blood clots or swollen lymph nodes Endocrine: temperature intolerance or unexpected weight changes Breast: new or changing breast lumps or nipple discharge Resp: cough, shortness of breath, or wheezing CV: chest pain or dyspnea on exertion GI: as per HPI GU: dysuria, trouble voiding, or hematuria MSK: joint  pain or joint stiffness Neuro: TIA or stroke symptoms Derm: pruritus and skin lesion changes Psych: anxiety and depression  PE Blood pressure 132/68, pulse 86, temperature 97.6 F (36.4 C), temperature source Oral, resp. rate (!) 30, height 5\' 7"  (1.702 m), weight 88.4 kg, SpO2 95%. Constitutional: NAD; conversant Eyes: Moist conjunctiva Lungs: Normal respiratory effort; crackles diffusely + tracheal secretions CV: RRR;  GI: Abd distended, tense with rebound and guarding Psychiatric: Appropriate affect  Results for orders placed or performed during the hospital encounter of 09/19/2023 (from the past 48 hour(s))  CBC with Differential     Status: Abnormal   Collection Time: 09/19/2023 12:12 PM  Result Value Ref Range   WBC 37.4 (H) 4.0 - 10.5 K/uL   RBC 3.77 (L) 4.22 - 5.81 MIL/uL   Hemoglobin 11.2 (L) 13.0 - 17.0 g/dL   HCT 09.8 (L) 11.9 - 14.7 %   MCV 92.0 80.0 - 100.0 fL   MCH 29.7 26.0 - 34.0 pg   MCHC 32.3 30.0 - 36.0 g/dL   RDW 82.9 56.2 - 13.0 %   Platelets 387 150 - 400 K/uL   nRBC 0.0 0.0 - 0.2 %   Neutrophils Relative % 96 %   Neutro Abs 35.9 (H) 1.7 - 7.7 K/uL   Lymphocytes Relative 3 %   Lymphs Abs 1.1 0.7 - 4.0 K/uL   Monocytes Relative 1 %   Monocytes Absolute 0.4 0.1 - 1.0 K/uL   Eosinophils Relative 0 %   Eosinophils Absolute 0.0 0.0 - 0.5 K/uL   Basophils Relative 0 %   Basophils  unhealed vertebral compression fracture with approximate 20% loss of height, new since previous. Mild lumbar levoscoliosis with multilevel spondylitic changes. IMPRESSION: 1. 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 2. 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 3. Pneumatosis in mid abdominal small bowel loops with mesenteric venous gas, and edema in the adjacent mesentery, suggesting severe small bowel ischemia. Recommend surgical consultation. 4. Patchy mucosal enhancement of gallbladder, containing gas, suggesting gangrenous cholecystitis. Critical Value/emergent results were called by telephone at the time of interpretation on 2023/09/19 at 5:21 pm to provider [facility], who verbally acknowledged these results. 5. Small volume ascites. 6. Splenic infarct. 7. T11 vertebral compression fracture, new since previous. 8. Patchy airspace opacities in the right middle lobe, and posteriorly in the lung bases right greater than left. 9.  Aortic Atherosclerosis (ICD10-I70.0). Electronically Signed   By: Corlis Leak M.D.   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 Both lungs are clear. The visualized skeletal structures are unremarkable. IMPRESSION: No acute abnormality of the lungs in AP portable projection. Electronically Signed   By: Jearld Lesch M.D.   On: 09/19/23 13:25    ROS - all of the below systems have been reviewed with the patient and positives are indicated with bold text General: chills, fever or night sweats Eyes: blurry vision or double vision ENT: epistaxis or sore throat Allergy/Immunology: itchy/watery eyes or nasal congestion Hematologic/Lymphatic: bleeding problems, blood clots or swollen lymph nodes Endocrine: temperature intolerance or unexpected weight changes Breast: new or changing breast lumps or nipple discharge Resp: cough, shortness of breath, or wheezing CV: chest pain or dyspnea on exertion GI: as per HPI GU: dysuria, trouble voiding, or hematuria MSK: joint  pain or joint stiffness Neuro: TIA or stroke symptoms Derm: pruritus and skin lesion changes Psych: anxiety and depression  PE Blood pressure 132/68, pulse 86, temperature 97.6 F (36.4 C), temperature source Oral, resp. rate (!) 30, height 5\' 7"  (1.702 m), weight 88.4 kg, SpO2 95%. Constitutional: NAD; conversant Eyes: Moist conjunctiva Lungs: Normal respiratory effort; crackles diffusely + tracheal secretions CV: RRR;  GI: Abd distended, tense with rebound and guarding Psychiatric: Appropriate affect  Results for orders placed or performed during the hospital encounter of 09/19/2023 (from the past 48 hour(s))  CBC with Differential     Status: Abnormal   Collection Time: 09/19/2023 12:12 PM  Result Value Ref Range   WBC 37.4 (H) 4.0 - 10.5 K/uL   RBC 3.77 (L) 4.22 - 5.81 MIL/uL   Hemoglobin 11.2 (L) 13.0 - 17.0 g/dL   HCT 09.8 (L) 11.9 - 14.7 %   MCV 92.0 80.0 - 100.0 fL   MCH 29.7 26.0 - 34.0 pg   MCHC 32.3 30.0 - 36.0 g/dL   RDW 82.9 56.2 - 13.0 %   Platelets 387 150 - 400 K/uL   nRBC 0.0 0.0 - 0.2 %   Neutrophils Relative % 96 %   Neutro Abs 35.9 (H) 1.7 - 7.7 K/uL   Lymphocytes Relative 3 %   Lymphs Abs 1.1 0.7 - 4.0 K/uL   Monocytes Relative 1 %   Monocytes Absolute 0.4 0.1 - 1.0 K/uL   Eosinophils Relative 0 %   Eosinophils Absolute 0.0 0.0 - 0.5 K/uL   Basophils Relative 0 %   Basophils  unhealed vertebral compression fracture with approximate 20% loss of height, new since previous. Mild lumbar levoscoliosis with multilevel spondylitic changes. IMPRESSION: 1. 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 2. 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 3. Pneumatosis in mid abdominal small bowel loops with mesenteric venous gas, and edema in the adjacent mesentery, suggesting severe small bowel ischemia. Recommend surgical consultation. 4. Patchy mucosal enhancement of gallbladder, containing gas, suggesting gangrenous cholecystitis. Critical Value/emergent results were called by telephone at the time of interpretation on 2023/09/19 at 5:21 pm to provider [facility], who verbally acknowledged these results. 5. Small volume ascites. 6. Splenic infarct. 7. T11 vertebral compression fracture, new since previous. 8. Patchy airspace opacities in the right middle lobe, and posteriorly in the lung bases right greater than left. 9.  Aortic Atherosclerosis (ICD10-I70.0). Electronically Signed   By: Corlis Leak M.D.   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 Both lungs are clear. The visualized skeletal structures are unremarkable. IMPRESSION: No acute abnormality of the lungs in AP portable projection. Electronically Signed   By: Jearld Lesch M.D.   On: 09/19/23 13:25    ROS - all of the below systems have been reviewed with the patient and positives are indicated with bold text General: chills, fever or night sweats Eyes: blurry vision or double vision ENT: epistaxis or sore throat Allergy/Immunology: itchy/watery eyes or nasal congestion Hematologic/Lymphatic: bleeding problems, blood clots or swollen lymph nodes Endocrine: temperature intolerance or unexpected weight changes Breast: new or changing breast lumps or nipple discharge Resp: cough, shortness of breath, or wheezing CV: chest pain or dyspnea on exertion GI: as per HPI GU: dysuria, trouble voiding, or hematuria MSK: joint  pain or joint stiffness Neuro: TIA or stroke symptoms Derm: pruritus and skin lesion changes Psych: anxiety and depression  PE Blood pressure 132/68, pulse 86, temperature 97.6 F (36.4 C), temperature source Oral, resp. rate (!) 30, height 5\' 7"  (1.702 m), weight 88.4 kg, SpO2 95%. Constitutional: NAD; conversant Eyes: Moist conjunctiva Lungs: Normal respiratory effort; crackles diffusely + tracheal secretions CV: RRR;  GI: Abd distended, tense with rebound and guarding Psychiatric: Appropriate affect  Results for orders placed or performed during the hospital encounter of 09/19/2023 (from the past 48 hour(s))  CBC with Differential     Status: Abnormal   Collection Time: 09/19/2023 12:12 PM  Result Value Ref Range   WBC 37.4 (H) 4.0 - 10.5 K/uL   RBC 3.77 (L) 4.22 - 5.81 MIL/uL   Hemoglobin 11.2 (L) 13.0 - 17.0 g/dL   HCT 09.8 (L) 11.9 - 14.7 %   MCV 92.0 80.0 - 100.0 fL   MCH 29.7 26.0 - 34.0 pg   MCHC 32.3 30.0 - 36.0 g/dL   RDW 82.9 56.2 - 13.0 %   Platelets 387 150 - 400 K/uL   nRBC 0.0 0.0 - 0.2 %   Neutrophils Relative % 96 %   Neutro Abs 35.9 (H) 1.7 - 7.7 K/uL   Lymphocytes Relative 3 %   Lymphs Abs 1.1 0.7 - 4.0 K/uL   Monocytes Relative 1 %   Monocytes Absolute 0.4 0.1 - 1.0 K/uL   Eosinophils Relative 0 %   Eosinophils Absolute 0.0 0.0 - 0.5 K/uL   Basophils Relative 0 %   Basophils

## 2023-08-21 NOTE — ED Notes (Signed)
Abs Immature Granulocytes 0.00 0.00 - 0.07 K/uL    Comment: Performed at [facility], [address], [place], [place] [address]  Troponin I (High Sensitivity)     Status: Abnormal   Collection Time: September 03, 2023 12:12 PM  Result Value Ref Range   Troponin I (High Sensitivity) 45 (H) <18 ng/L    Comment: (NOTE) Elevated high sensitivity troponin I (hsTnI) values and significant  changes across serial measurements may suggest ACS but many other  chronic and acute conditions are known to elevate hsTnI results.  Refer to the "Links" section for chest pain algorithms and additional  guidance. Performed at [facility], [address], [place], [place] [address]   I-Stat Lactic Acid     Status: Abnormal   Collection Time: 2023/09/03 12:48 PM  Result Value Ref Range   Lactic Acid, Venous 6.7 (HH) 0.5 - 1.9 mmol/L   Comment NOTIFIED PHYSICIAN   Basic metabolic panel     Status: Abnormal   Collection Time: 09/03/23  1:13 PM  Result Value Ref Range   Sodium 133 (L) 135 - 145 mmol/L   Potassium 6.0 (H) 3.5 - 5.1 mmol/L   Chloride 103 98 - 111 mmol/L   CO2 15 (L) 22 - 32 mmol/L   Glucose, Bld 160 (H) 70 - 99 mg/dL    Comment: Glucose reference range applies only to samples taken after fasting for at  least 8 hours.   BUN 56 (H) 8 - 23 mg/dL   Creatinine, Ser 0.98 (H) 0.61 - 1.24 mg/dL   Calcium 9.1 8.9 - 11.9 mg/dL   GFR, Estimated 32 (L) >60 mL/min    Comment: (NOTE) Calculated using the CKD-EPI Creatinine Equation (2021)    Anion gap 15 5 - 15    Comment: Performed at [facility], [address] [place], [place] [address]  Hepatic function panel     Status: Abnormal   Collection Time: 2023/09/03  1:13 PM  Result Value Ref Range   Total Protein 8.4 (H) 6.5 - 8.1 g/dL   Albumin 3.3 (L) 3.5 - 5.0 g/dL   AST 54 (H) 15 - 41 U/L   ALT 33 0 - 44 U/L   Alkaline Phosphatase 189 (H) 38 - 126 U/L   Total Bilirubin 2.8 (H) 0.3 - 1.2 mg/dL   Bilirubin, Direct 1.1 (H) 0.0 - 0.2 mg/dL   Indirect Bilirubin 1.7 (H) 0.3 - 0.9 mg/dL    Comment: Performed at [facility], [address] [place], [place] [address]  Protime-INR     Status: Abnormal   Collection Time: Sep 03, 2023  1:13 PM  Result Value Ref Range   Prothrombin Time 44.1 (H) 11.4 - 15.2 seconds   INR 4.7 (HH) 0.8 - 1.2    Comment: REPEATED TO VERIFY CRITICAL RESULT CALLED TO, READ BACK BY AND VERIFIED WITH: CASSANDRA Leon Montoya,RN AT 1444 10/06/20204 BY ZBEECH. CORRECT YEAR IS 2024 (NOTE) INR goal varies based on device and disease states. Performed at [facility], [address] [place], [place] [address] CORRECTED ON September 03, 2023 AT 1755: PREVIOUSLY REPORTED AS 4.7 REPEATED TO VERIFY CRITICAL RESULT CALLED TO, READ BACK BY AND VERIFIED WITH: CASSANDRA Norlene Lanes,RN AT 1444 10/06/20204 BY ZBEECH.   Troponin I (High Sensitivity)     Status: Abnormal   Collection Time: September 03, 2023  2:27 PM  Result Value Ref Range   Troponin I (High Sensitivity) 43 (H) <18 ng/L    Comment: (NOTE) Elevated high sensitivity  ductal dilatation, or regional inflammatory change. Spleen: Normal in size. Wedge-shaped region of decreased enhancement which persists on venous phase suggesting segmental infarct. Adrenals/Urinary Tract: No adrenal mass. Symmetric renal parenchymal enhancement without urolithiasis or hydronephrosis. Urinary bladder partially distended. Stomach/Bowel: Small hiatal hernia. Stomach is distended by gas and fluid. Proximal small bowel decompressed. Pneumatosis in mid abdominal small bowel loops which are  nondilated. There is edema in the adjacent anterior mesentery. Mesenteric venous gas in multiple peripheral branches. Appendix not identified. The colon is incompletely distended, unremarkable. Lymphatic: No abdominal or pelvic adenopathy. Reproductive: Prostate is unremarkable. Other: Small volume pelvic, right pericolic gutter, and perihepatic ascites. No free air. Musculoskeletal: T11 unhealed vertebral compression fracture with approximate 20% loss of height, new since previous. Mild lumbar levoscoliosis with multilevel spondylitic changes. IMPRESSION: 1. 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 2. 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 3. Pneumatosis in mid abdominal small bowel loops with mesenteric venous gas, and edema in the adjacent mesentery, suggesting severe small bowel ischemia. Recommend surgical consultation. 4. Patchy mucosal enhancement of gallbladder, containing gas, suggesting gangrenous cholecystitis. Critical Value/emergent results were called by telephone at the time of interpretation on 08/27/2023 at 5:21 pm to provider [facility], who verbally acknowledged these results. 5. Small volume ascites. 6. Splenic infarct. 7. T11 vertebral compression fracture, new since previous. 8. Patchy airspace opacities in the right middle lobe, and posteriorly in the lung bases right greater than left. 9.  Aortic Atherosclerosis (ICD10-I70.0). Electronically Signed   By: Corlis Leak M.D.   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 Both lungs are clear. The visualized skeletal structures are unremarkable. IMPRESSION: No acute abnormality of the lungs in AP portable projection. Electronically Signed   By: Jearld Lesch M.D.   On: 08/27/2023 13:25    Pending  Labs Unresulted Labs (From admission, onward)     Start     Ordered   08/27/23 1721  Blood culture (routine x 2)  BLOOD CULTURE X 2,   R (with STAT occurrences)      08-27-2023 1720   08-27-23 1209  Urinalysis, w/ Reflex to Culture (Infection Suspected) -Urine, Clean Catch  Once,   URGENT       Question:  Specimen Source  Answer:  Urine, Clean Catch   08-27-23 1208            Vitals/Pain Today's Vitals   08-27-2023 1630 Aug 27, 2023 1700 27-Aug-2023 1742 27-Aug-2023 1744  BP: 138/65 (!) 140/75 134/63   Pulse: 86 85 87   Resp: (!) 21 (!) 25 (!) 26   Temp:      TempSrc:      SpO2: 98% 98% 98%   Weight:      Height:      PainSc:    6     Isolation Precautions No active isolations  Medications Medications  0.9 %  sodium chloride infusion ( Intravenous New Bag/Given 08/27/2023 1724)  fentaNYL (SUBLIMAZE) injection 50 mcg (50 mcg Intravenous Given 08/27/23 1723)  fentaNYL (SUBLIMAZE) injection 50 mcg (50 mcg Intravenous Given Aug 27, 2023 1210)  sodium chloride 0.9 % bolus 500 mL (0 mLs Intravenous Stopped Aug 27, 2023 1320)  ondansetron (ZOFRAN) injection 4 mg (4 mg Intravenous Given August 27, 2023 1210)  sodium chloride 0.9 % bolus 1,000 mL (0 mLs  Abs Immature Granulocytes 0.00 0.00 - 0.07 K/uL    Comment: Performed at [facility], [address], [place], [place] [address]  Troponin I (High Sensitivity)     Status: Abnormal   Collection Time: September 03, 2023 12:12 PM  Result Value Ref Range   Troponin I (High Sensitivity) 45 (H) <18 ng/L    Comment: (NOTE) Elevated high sensitivity troponin I (hsTnI) values and significant  changes across serial measurements may suggest ACS but many other  chronic and acute conditions are known to elevate hsTnI results.  Refer to the "Links" section for chest pain algorithms and additional  guidance. Performed at [facility], [address], [place], [place] [address]   I-Stat Lactic Acid     Status: Abnormal   Collection Time: 2023/09/03 12:48 PM  Result Value Ref Range   Lactic Acid, Venous 6.7 (HH) 0.5 - 1.9 mmol/L   Comment NOTIFIED PHYSICIAN   Basic metabolic panel     Status: Abnormal   Collection Time: 09/03/23  1:13 PM  Result Value Ref Range   Sodium 133 (L) 135 - 145 mmol/L   Potassium 6.0 (H) 3.5 - 5.1 mmol/L   Chloride 103 98 - 111 mmol/L   CO2 15 (L) 22 - 32 mmol/L   Glucose, Bld 160 (H) 70 - 99 mg/dL    Comment: Glucose reference range applies only to samples taken after fasting for at  least 8 hours.   BUN 56 (H) 8 - 23 mg/dL   Creatinine, Ser 0.98 (H) 0.61 - 1.24 mg/dL   Calcium 9.1 8.9 - 11.9 mg/dL   GFR, Estimated 32 (L) >60 mL/min    Comment: (NOTE) Calculated using the CKD-EPI Creatinine Equation (2021)    Anion gap 15 5 - 15    Comment: Performed at [facility], [address] [place], [place] [address]  Hepatic function panel     Status: Abnormal   Collection Time: 2023/09/03  1:13 PM  Result Value Ref Range   Total Protein 8.4 (H) 6.5 - 8.1 g/dL   Albumin 3.3 (L) 3.5 - 5.0 g/dL   AST 54 (H) 15 - 41 U/L   ALT 33 0 - 44 U/L   Alkaline Phosphatase 189 (H) 38 - 126 U/L   Total Bilirubin 2.8 (H) 0.3 - 1.2 mg/dL   Bilirubin, Direct 1.1 (H) 0.0 - 0.2 mg/dL   Indirect Bilirubin 1.7 (H) 0.3 - 0.9 mg/dL    Comment: Performed at [facility], [address] [place], [place] [address]  Protime-INR     Status: Abnormal   Collection Time: Sep 03, 2023  1:13 PM  Result Value Ref Range   Prothrombin Time 44.1 (H) 11.4 - 15.2 seconds   INR 4.7 (HH) 0.8 - 1.2    Comment: REPEATED TO VERIFY CRITICAL RESULT CALLED TO, READ BACK BY AND VERIFIED WITH: CASSANDRA Leon Montoya,RN AT 1444 10/06/20204 BY ZBEECH. CORRECT YEAR IS 2024 (NOTE) INR goal varies based on device and disease states. Performed at [facility], [address] [place], [place] [address] CORRECTED ON September 03, 2023 AT 1755: PREVIOUSLY REPORTED AS 4.7 REPEATED TO VERIFY CRITICAL RESULT CALLED TO, READ BACK BY AND VERIFIED WITH: CASSANDRA Norlene Lanes,RN AT 1444 10/06/20204 BY ZBEECH.   Troponin I (High Sensitivity)     Status: Abnormal   Collection Time: September 03, 2023  2:27 PM  Result Value Ref Range   Troponin I (High Sensitivity) 43 (H) <18 ng/L    Comment: (NOTE) Elevated high sensitivity  ductal dilatation, or regional inflammatory change. Spleen: Normal in size. Wedge-shaped region of decreased enhancement which persists on venous phase suggesting segmental infarct. Adrenals/Urinary Tract: No adrenal mass. Symmetric renal parenchymal enhancement without urolithiasis or hydronephrosis. Urinary bladder partially distended. Stomach/Bowel: Small hiatal hernia. Stomach is distended by gas and fluid. Proximal small bowel decompressed. Pneumatosis in mid abdominal small bowel loops which are  nondilated. There is edema in the adjacent anterior mesentery. Mesenteric venous gas in multiple peripheral branches. Appendix not identified. The colon is incompletely distended, unremarkable. Lymphatic: No abdominal or pelvic adenopathy. Reproductive: Prostate is unremarkable. Other: Small volume pelvic, right pericolic gutter, and perihepatic ascites. No free air. Musculoskeletal: T11 unhealed vertebral compression fracture with approximate 20% loss of height, new since previous. Mild lumbar levoscoliosis with multilevel spondylitic changes. IMPRESSION: 1. 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 2. 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 3. Pneumatosis in mid abdominal small bowel loops with mesenteric venous gas, and edema in the adjacent mesentery, suggesting severe small bowel ischemia. Recommend surgical consultation. 4. Patchy mucosal enhancement of gallbladder, containing gas, suggesting gangrenous cholecystitis. Critical Value/emergent results were called by telephone at the time of interpretation on 08/27/2023 at 5:21 pm to provider [facility], who verbally acknowledged these results. 5. Small volume ascites. 6. Splenic infarct. 7. T11 vertebral compression fracture, new since previous. 8. Patchy airspace opacities in the right middle lobe, and posteriorly in the lung bases right greater than left. 9.  Aortic Atherosclerosis (ICD10-I70.0). Electronically Signed   By: Corlis Leak M.D.   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 Both lungs are clear. The visualized skeletal structures are unremarkable. IMPRESSION: No acute abnormality of the lungs in AP portable projection. Electronically Signed   By: Jearld Lesch M.D.   On: 08/27/2023 13:25    Pending  Labs Unresulted Labs (From admission, onward)     Start     Ordered   08/27/23 1721  Blood culture (routine x 2)  BLOOD CULTURE X 2,   R (with STAT occurrences)      08-27-2023 1720   08-27-23 1209  Urinalysis, w/ Reflex to Culture (Infection Suspected) -Urine, Clean Catch  Once,   URGENT       Question:  Specimen Source  Answer:  Urine, Clean Catch   08-27-23 1208            Vitals/Pain Today's Vitals   08-27-2023 1630 Aug 27, 2023 1700 27-Aug-2023 1742 27-Aug-2023 1744  BP: 138/65 (!) 140/75 134/63   Pulse: 86 85 87   Resp: (!) 21 (!) 25 (!) 26   Temp:      TempSrc:      SpO2: 98% 98% 98%   Weight:      Height:      PainSc:    6     Isolation Precautions No active isolations  Medications Medications  0.9 %  sodium chloride infusion ( Intravenous New Bag/Given 08/27/2023 1724)  fentaNYL (SUBLIMAZE) injection 50 mcg (50 mcg Intravenous Given 08/27/23 1723)  fentaNYL (SUBLIMAZE) injection 50 mcg (50 mcg Intravenous Given Aug 27, 2023 1210)  sodium chloride 0.9 % bolus 500 mL (0 mLs Intravenous Stopped Aug 27, 2023 1320)  ondansetron (ZOFRAN) injection 4 mg (4 mg Intravenous Given August 27, 2023 1210)  sodium chloride 0.9 % bolus 1,000 mL (0 mLs  Abs Immature Granulocytes 0.00 0.00 - 0.07 K/uL    Comment: Performed at [facility], [address], [place], [place] [address]  Troponin I (High Sensitivity)     Status: Abnormal   Collection Time: September 03, 2023 12:12 PM  Result Value Ref Range   Troponin I (High Sensitivity) 45 (H) <18 ng/L    Comment: (NOTE) Elevated high sensitivity troponin I (hsTnI) values and significant  changes across serial measurements may suggest ACS but many other  chronic and acute conditions are known to elevate hsTnI results.  Refer to the "Links" section for chest pain algorithms and additional  guidance. Performed at [facility], [address], [place], [place] [address]   I-Stat Lactic Acid     Status: Abnormal   Collection Time: 2023/09/03 12:48 PM  Result Value Ref Range   Lactic Acid, Venous 6.7 (HH) 0.5 - 1.9 mmol/L   Comment NOTIFIED PHYSICIAN   Basic metabolic panel     Status: Abnormal   Collection Time: 09/03/23  1:13 PM  Result Value Ref Range   Sodium 133 (L) 135 - 145 mmol/L   Potassium 6.0 (H) 3.5 - 5.1 mmol/L   Chloride 103 98 - 111 mmol/L   CO2 15 (L) 22 - 32 mmol/L   Glucose, Bld 160 (H) 70 - 99 mg/dL    Comment: Glucose reference range applies only to samples taken after fasting for at  least 8 hours.   BUN 56 (H) 8 - 23 mg/dL   Creatinine, Ser 0.98 (H) 0.61 - 1.24 mg/dL   Calcium 9.1 8.9 - 11.9 mg/dL   GFR, Estimated 32 (L) >60 mL/min    Comment: (NOTE) Calculated using the CKD-EPI Creatinine Equation (2021)    Anion gap 15 5 - 15    Comment: Performed at [facility], [address] [place], [place] [address]  Hepatic function panel     Status: Abnormal   Collection Time: 2023/09/03  1:13 PM  Result Value Ref Range   Total Protein 8.4 (H) 6.5 - 8.1 g/dL   Albumin 3.3 (L) 3.5 - 5.0 g/dL   AST 54 (H) 15 - 41 U/L   ALT 33 0 - 44 U/L   Alkaline Phosphatase 189 (H) 38 - 126 U/L   Total Bilirubin 2.8 (H) 0.3 - 1.2 mg/dL   Bilirubin, Direct 1.1 (H) 0.0 - 0.2 mg/dL   Indirect Bilirubin 1.7 (H) 0.3 - 0.9 mg/dL    Comment: Performed at [facility], [address] [place], [place] [address]  Protime-INR     Status: Abnormal   Collection Time: Sep 03, 2023  1:13 PM  Result Value Ref Range   Prothrombin Time 44.1 (H) 11.4 - 15.2 seconds   INR 4.7 (HH) 0.8 - 1.2    Comment: REPEATED TO VERIFY CRITICAL RESULT CALLED TO, READ BACK BY AND VERIFIED WITH: CASSANDRA Leon Montoya,RN AT 1444 10/06/20204 BY ZBEECH. CORRECT YEAR IS 2024 (NOTE) INR goal varies based on device and disease states. Performed at [facility], [address] [place], [place] [address] CORRECTED ON September 03, 2023 AT 1755: PREVIOUSLY REPORTED AS 4.7 REPEATED TO VERIFY CRITICAL RESULT CALLED TO, READ BACK BY AND VERIFIED WITH: CASSANDRA Norlene Lanes,RN AT 1444 10/06/20204 BY ZBEECH.   Troponin I (High Sensitivity)     Status: Abnormal   Collection Time: September 03, 2023  2:27 PM  Result Value Ref Range   Troponin I (High Sensitivity) 43 (H) <18 ng/L    Comment: (NOTE) Elevated high sensitivity  Abs Immature Granulocytes 0.00 0.00 - 0.07 K/uL    Comment: Performed at [facility], [address], [place], [place] [address]  Troponin I (High Sensitivity)     Status: Abnormal   Collection Time: September 03, 2023 12:12 PM  Result Value Ref Range   Troponin I (High Sensitivity) 45 (H) <18 ng/L    Comment: (NOTE) Elevated high sensitivity troponin I (hsTnI) values and significant  changes across serial measurements may suggest ACS but many other  chronic and acute conditions are known to elevate hsTnI results.  Refer to the "Links" section for chest pain algorithms and additional  guidance. Performed at [facility], [address], [place], [place] [address]   I-Stat Lactic Acid     Status: Abnormal   Collection Time: 2023/09/03 12:48 PM  Result Value Ref Range   Lactic Acid, Venous 6.7 (HH) 0.5 - 1.9 mmol/L   Comment NOTIFIED PHYSICIAN   Basic metabolic panel     Status: Abnormal   Collection Time: 09/03/23  1:13 PM  Result Value Ref Range   Sodium 133 (L) 135 - 145 mmol/L   Potassium 6.0 (H) 3.5 - 5.1 mmol/L   Chloride 103 98 - 111 mmol/L   CO2 15 (L) 22 - 32 mmol/L   Glucose, Bld 160 (H) 70 - 99 mg/dL    Comment: Glucose reference range applies only to samples taken after fasting for at  least 8 hours.   BUN 56 (H) 8 - 23 mg/dL   Creatinine, Ser 0.98 (H) 0.61 - 1.24 mg/dL   Calcium 9.1 8.9 - 11.9 mg/dL   GFR, Estimated 32 (L) >60 mL/min    Comment: (NOTE) Calculated using the CKD-EPI Creatinine Equation (2021)    Anion gap 15 5 - 15    Comment: Performed at [facility], [address] [place], [place] [address]  Hepatic function panel     Status: Abnormal   Collection Time: 2023/09/03  1:13 PM  Result Value Ref Range   Total Protein 8.4 (H) 6.5 - 8.1 g/dL   Albumin 3.3 (L) 3.5 - 5.0 g/dL   AST 54 (H) 15 - 41 U/L   ALT 33 0 - 44 U/L   Alkaline Phosphatase 189 (H) 38 - 126 U/L   Total Bilirubin 2.8 (H) 0.3 - 1.2 mg/dL   Bilirubin, Direct 1.1 (H) 0.0 - 0.2 mg/dL   Indirect Bilirubin 1.7 (H) 0.3 - 0.9 mg/dL    Comment: Performed at [facility], [address] [place], [place] [address]  Protime-INR     Status: Abnormal   Collection Time: Sep 03, 2023  1:13 PM  Result Value Ref Range   Prothrombin Time 44.1 (H) 11.4 - 15.2 seconds   INR 4.7 (HH) 0.8 - 1.2    Comment: REPEATED TO VERIFY CRITICAL RESULT CALLED TO, READ BACK BY AND VERIFIED WITH: CASSANDRA Leon Montoya,RN AT 1444 10/06/20204 BY ZBEECH. CORRECT YEAR IS 2024 (NOTE) INR goal varies based on device and disease states. Performed at [facility], [address] [place], [place] [address] CORRECTED ON September 03, 2023 AT 1755: PREVIOUSLY REPORTED AS 4.7 REPEATED TO VERIFY CRITICAL RESULT CALLED TO, READ BACK BY AND VERIFIED WITH: CASSANDRA Norlene Lanes,RN AT 1444 10/06/20204 BY ZBEECH.   Troponin I (High Sensitivity)     Status: Abnormal   Collection Time: September 03, 2023  2:27 PM  Result Value Ref Range   Troponin I (High Sensitivity) 43 (H) <18 ng/L    Comment: (NOTE) Elevated high sensitivity  Abs Immature Granulocytes 0.00 0.00 - 0.07 K/uL    Comment: Performed at [facility], [address], [place], [place] [address]  Troponin I (High Sensitivity)     Status: Abnormal   Collection Time: September 03, 2023 12:12 PM  Result Value Ref Range   Troponin I (High Sensitivity) 45 (H) <18 ng/L    Comment: (NOTE) Elevated high sensitivity troponin I (hsTnI) values and significant  changes across serial measurements may suggest ACS but many other  chronic and acute conditions are known to elevate hsTnI results.  Refer to the "Links" section for chest pain algorithms and additional  guidance. Performed at [facility], [address], [place], [place] [address]   I-Stat Lactic Acid     Status: Abnormal   Collection Time: 2023/09/03 12:48 PM  Result Value Ref Range   Lactic Acid, Venous 6.7 (HH) 0.5 - 1.9 mmol/L   Comment NOTIFIED PHYSICIAN   Basic metabolic panel     Status: Abnormal   Collection Time: 09/03/23  1:13 PM  Result Value Ref Range   Sodium 133 (L) 135 - 145 mmol/L   Potassium 6.0 (H) 3.5 - 5.1 mmol/L   Chloride 103 98 - 111 mmol/L   CO2 15 (L) 22 - 32 mmol/L   Glucose, Bld 160 (H) 70 - 99 mg/dL    Comment: Glucose reference range applies only to samples taken after fasting for at  least 8 hours.   BUN 56 (H) 8 - 23 mg/dL   Creatinine, Ser 0.98 (H) 0.61 - 1.24 mg/dL   Calcium 9.1 8.9 - 11.9 mg/dL   GFR, Estimated 32 (L) >60 mL/min    Comment: (NOTE) Calculated using the CKD-EPI Creatinine Equation (2021)    Anion gap 15 5 - 15    Comment: Performed at [facility], [address] [place], [place] [address]  Hepatic function panel     Status: Abnormal   Collection Time: 2023/09/03  1:13 PM  Result Value Ref Range   Total Protein 8.4 (H) 6.5 - 8.1 g/dL   Albumin 3.3 (L) 3.5 - 5.0 g/dL   AST 54 (H) 15 - 41 U/L   ALT 33 0 - 44 U/L   Alkaline Phosphatase 189 (H) 38 - 126 U/L   Total Bilirubin 2.8 (H) 0.3 - 1.2 mg/dL   Bilirubin, Direct 1.1 (H) 0.0 - 0.2 mg/dL   Indirect Bilirubin 1.7 (H) 0.3 - 0.9 mg/dL    Comment: Performed at [facility], [address] [place], [place] [address]  Protime-INR     Status: Abnormal   Collection Time: Sep 03, 2023  1:13 PM  Result Value Ref Range   Prothrombin Time 44.1 (H) 11.4 - 15.2 seconds   INR 4.7 (HH) 0.8 - 1.2    Comment: REPEATED TO VERIFY CRITICAL RESULT CALLED TO, READ BACK BY AND VERIFIED WITH: CASSANDRA Leon Montoya,RN AT 1444 10/06/20204 BY ZBEECH. CORRECT YEAR IS 2024 (NOTE) INR goal varies based on device and disease states. Performed at [facility], [address] [place], [place] [address] CORRECTED ON September 03, 2023 AT 1755: PREVIOUSLY REPORTED AS 4.7 REPEATED TO VERIFY CRITICAL RESULT CALLED TO, READ BACK BY AND VERIFIED WITH: CASSANDRA Norlene Lanes,RN AT 1444 10/06/20204 BY ZBEECH.   Troponin I (High Sensitivity)     Status: Abnormal   Collection Time: September 03, 2023  2:27 PM  Result Value Ref Range   Troponin I (High Sensitivity) 43 (H) <18 ng/L    Comment: (NOTE) Elevated high sensitivity

## 2023-08-21 NOTE — Progress Notes (Signed)
NEW ADMISSION NOTE   Arrival Method: ED stretcher Mental Orientation: AAOx4 Telemetry: NA Assessment: Completed Skin: See flowsheet IV: LAC Pain: 5/10 Tubes: n/a Safety Measures: Safety Fall Prevention Plan has been given, discussed and signed Admission: Completed [facility] Orientation: Patient has been orientated to the room, unit and staff.  Family: none at bedside   Orders have been reviewed and implemented. Will continue to monitor the patient. Call light has been placed within reach and bed alarm has been activated.

## 2023-08-21 NOTE — ED Triage Notes (Signed)
Pt BIB EMS from [facility] for abdominal pain starting last night. Pt had episode of emesis last which improved pain, but pain returned this morning. NPO since this AM. Per facility he is normally more distended. New jaundice since 2022. INR checks, today 5.5, coumadin held since 08/15/2023. EMS was unable to get spo2 reading and placed pt on 2 lpm [place]. A&Ox4, NAD noted at this time.

## 2023-08-21 NOTE — ED Notes (Signed)
Transport here at this time to transport pt to room on assigned unit.

## 2023-08-21 NOTE — H&P (Signed)
History and Physical    Patient: Peter Beltran:[id number] DOB: 1925-04-25 DOA: 2023/09/01 DOS: the patient was seen and examined on 2023-09-01 PCP: Mattie Marlin, MD  Patient coming from: ALF/ILF  Chief Complaint:  Chief Complaint  Patient presents with   Abdominal Pain    All quadrants   HPI: Peter Beltran is a 89 y.o. male with medical history significant of severe aortic stenosis status post aortic valve replacement with Saint Jude's valve in 2012 on chronic Coumadin therapy, coronary artery disease, history of bacteremia, COPD, anxiety disorder and multiple medical problems who presented to the ER with sudden onset of severe abdominal pain and nausea.  Symptoms started last night with an episode of emesis.  The pain initially improved but then returned this morning.  He has not eating anything.  He started having more distention of his abdomen and was brought in to the ER.  The facility noted severe jaundice.  They checked his INR he was 5.5 given that he has been off Coumadin since September 30s.  When EMS arrived they could not get his oxygen saturation and put him on oxygen and bring it.  Patient was evaluated and found to have gangrenous cholecystitis with ischemic bowel.  Patient and daughter have discussed with the ER and his general surgery.  I have also discussed with patient who was clear that they want to proceed with comfort care.  Daughter is power of attorney and also the pathologist.  At this point patient is proceeding with hospice and focus is comfort care only.  Review of Systems: As mentioned in the history of present illness. All other systems reviewed and are negative. Past Medical History:  Diagnosis Date   Acute kidney failure, unspecified (HCC) 07/22/2019   Acute maxillary sinusitis, unspecified 08/16/2017   Allergic rhinitis, unspecified 02/04/2018   Anxiety disorder, unspecified 09/17/2013   Anxiety state 09/17/2013   Aortic stenosis    post aortic  valve replacement with St.Jude's valve by Dr.VanTrigt on 07-27-11   Aortic valve disorder 05/29/2013   Atherosclerotic heart disease of native coronary artery without angina pectoris 08/16/2017   Bacteremia 08/15/2012   Benign prostatic hyperplasia with lower urinary tract symptoms 08/16/2017   Bradycardia with less than 60 beats per minute 04/29/2021   Candidal stomatitis 08/16/2017   Cellulitis, unspecified 07/25/2019   Chronic airway obstruction (HCC) 04/21/2013   Chronic coronary artery disease 04/10/2016   Formatting of this note might be different from the original. Overview:  status post CABG x3 in 2008 Formatting of this note might be different from the original. Overview:  Overview:  status post CABG x3 in 2008   Chronic kidney disease, stage 3a (HCC) 10/08/2021   Chronic rhinitis 12/27/2019   Last Assessment & Plan:  Concern over his nose. Chronic recurring nasal congestion.  Had a severe episode recently that responded to over-the-counter medication.  Currently asymptomatic.  Wants to make sure all is okay. EXAM intranasally shows no polyps, purulence, masses or obstructing anatomy.  Mucosa is overall healthy. PLAN: Reassured all looks okay.  I think what he is doing is fine.  Happy t   Cognitive communication deficit 10/28/2020   Constipation 08/16/2017   Constipation, unspecified 08/16/2017   Coronary artery disease    status post CABG x3 in 2008   COVID-19 11/10/2020   Diabetic polyneuropathy associated with type 2 diabetes mellitus (HCC) 10/04/2016   Disorder of the skin and subcutaneous tissue, unspecified 07/25/2019   Disorders of muscle in diseases classified elsewhere,  multiple sites 10/28/2020   Disturbance of salivary secretion 04/21/2013   Dry mouth, unspecified 10/18/2018   Dyslipidemia    Dysphagia 04/21/2013   Elevated troponin 10/06/2021   Encounter for current long-term use of anticoagulants 05/29/2013   Formatting of this note might be different from the  original. IMO routine update Formatting of this note might be different from the original. Overview:  IMO routine update   Encounter for immunization 07/25/2019   Enlarged prostate with lower urinary tract symptoms (LUTS) 05/29/2013   Esophageal reflux 02/26/2014   Essential hypertension 01/19/2019   Fall 04/30/2021   Follow-up visit for aortic valve replacement with metallic valve 01/19/2019   Foreign body aspiration 03/05/2016   Gastro-esophageal reflux disease without esophagitis 02/26/2014   Generalized edema 10/18/2018   Gout    Gouty arthropathy 05/29/2013   Hidrocystoma of left eyelid 12/10/2014   History of falling 10/28/2020   Hyperlipidemia, unspecified 11/11/2016   Hyperplasia of prostate    benign   Hypersomnia 06/14/2013   Hypertension    Hypertensive heart and chronic kidney disease with heart failure and stage 1 through stage 4 chronic kidney disease, or unspecified chronic kidney disease (HCC) 10/18/2018   Hypertensive heart disease with heart failure (HCC) 10/18/2018   Hypertriglyceridemia 11/11/2016   Hypothyroidism    Hypothyroidism, unspecified 04/10/2016   Impacted cerumen, bilateral 08/16/2017   Impaired fasting glucose 04/21/2013   Iron deficiency anemia, unspecified 08/16/2017   Ischemic optic neuropathy of both eyes 01/28/2016   Lab test positive for detection of COVID-19 virus 11/10/2020   Localized edema 07/25/2019   Long term (current) use of anticoagulants 05/29/2013   Overview:  IMO routine update IMO routine update   Long term (current) use of aspirin 08/16/2017   Low back pain 08/16/2017   Low-tension glaucoma of both eyes, 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/2016   tmax 12/14 OCT 11/03/2016 VF 12/04/2015 Gonio 05/07/2015 tmax 12/14 OCT 11/03/2016 VF 12/04/2015 Gonio 05/07/2015   Low-tension glaucoma, bilateral, 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/2016   tmax 12/14 OCT 11/03/2016 VF 12/04/2015 Gonio 05/07/2015 tmax 12/14 OCT 11/03/2016 VF 12/04/2015 Gonio 05/07/2015   Lumbar stenosis  04/30/2015   Memory loss, short term 03/30/2016   Mild cognitive impairment 08/30/2016   Mixed dyslipidemia 01/19/2019   Moderate persistent asthma without complication 03/05/2016   Moderate persistent asthma, uncomplicated 03/05/2016   Monitoring for long-term anticoagulant use 08/03/2017   Muscle weakness (generalized) 10/28/2020   Myocardial infarct (HCC)    Nasal congestion 02/04/2018   Nausea with vomiting, unspecified 10/22/2021   Neuropathy 04/04/2014   Obstructive sleep apnea 12/17/2013   Obstructive sleep apnea of adult 12/17/2013   Old myocardial infarction 08/22/2019   Oral mucositis (ulcerative), unspecified 10/28/2020   Osteoarthrosis 04/21/2013   Other abnormalities of gait and mobility 10/08/2021   Other malaise 07/25/2019   Other specified disorders of nose and nasal sinuses 10/28/2020   Overweight 01/19/2019   Pain in thoracic spine 10/18/2018   Periodic limb movement disorder 04/30/2021   Periodic limb movements of sleep 08/26/2013   Personal history of COVID-19 10/08/2021   Presence of aortocoronary bypass graft 10/18/2018   Presence of prosthetic heart valve 10/18/2018   Pruritus, unspecified 11/10/2020   Pseudophakia of both eyes 12/10/2014   Psoriasis and similar disorder 01/28/2014   Rash and other nonspecific skin eruption 08/16/2017   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/28/2013   Restless legs syndrome 08/26/2013   S/P AVR (aortic valve replacement) 02/22/2022   Severe sepsis (HCC) 10/06/2021   Spells 08/26/2013   Spinal  stenosis, lumbar region without neurogenic claudication 04/30/2015   Status post aortic valve replacement with metallic valve 01/19/2019   Status post shoulder joint replacement    right shoulder   Subtherapeutic anticoagulation 04/10/2016   Syncope and collapse 06/14/2013   Overview:  with seizure like activity. with seizure like activity.   Traumatic subarachnoid hemorrhage without loss of consciousness, subsequent encounter 10/28/2020    Type 2 diabetes mellitus with diabetic neuropathy, unspecified (HCC) 08/16/2017   Unspecified abnormalities of gait and mobility 10/18/2018   Unspecified Escherichia coli (E. coli) as the cause of diseases classified elsewhere 10/08/2021   Unspecified jaundice 11/10/2020   Unspecified osteoarthritis, unspecified site 04/21/2013   Unspecified urinary incontinence 05/17/2018   Urinary retention 07/18/2018   Vitamin D deficiency, unspecified 08/16/2017   Vomiting and diarrhea 10/06/2021   Past Surgical History:  Procedure Laterality Date   AORTIC VALVE REPLACEMENT     CORONARY ARTERY BYPASS GRAFT     PROSTATE ABLATION     TONSILLECTOMY     Social History:  reports that he has quit smoking. He has never used smokeless tobacco. He reports that he does not drink alcohol and does not use drugs.  Allergies  Allergen Reactions   Atorvastatin Calcium     Other Reaction(s): Other (See Comments)  Developed myopathy., Muscle pain, Myopathy   Atorvastatin Calcium Other (See Comments)    Developed myopathy.     Dorzolamide Other (See Comments)    Follicula     Brimonidine Other (See Comments)    Red and burning eyes   Brinzolamide-Brimonidine Swelling    Redness in eyes - reaction to Simbrinza   Rotigotine Other (See Comments)    Falling asleep uncontrollably - reaction to Neupro   Tamsulosin Other (See Comments)    Unknown reaction (Flomax)   Tetracyclines & Related    Zolpidem Other (See Comments)    Pt. states that he possibly had a neuro reaction.   Dutasteride Rash    Reaction to Avodart   Vancomycin Rash    Family History  Problem Relation Age of Onset   Hypertension Neg Hx    Diabetes Neg Hx    Cancer Neg Hx    Heart disease Neg Hx     Prior to Admission medications   Medication Sig Start Date End Date Taking? Authorizing Provider  acetaminophen (TYLENOL) 325 MG tablet Take 650 mg by mouth every 6 (six) hours as needed for mild pain or moderate pain.   Yes [provider]  albuterol (PROVENTIL HFA;VENTOLIN HFA) 108 (90 Base) MCG/ACT inhaler Inhale 2 puffs into the lungs every 6 (six) hours as needed for wheezing or shortness of breath.   Yes [provider]  busPIRone (BUSPAR) 5 MG tablet Take 5 mg by mouth 2 (two) times daily. 01/05/23  Yes [provider]  ergocalciferol (VITAMIN D2) 1.25 MG (50000 UT) capsule Take 50,000 Units by mouth every Tuesday.   Yes [provider]  fluticasone (FLONASE) 50 MCG/ACT nasal spray Place 1 spray into both nostrils in the morning and at bedtime.   Yes [provider]  Fluticasone-Salmeterol (ADVAIR) 100-50 MCG/DOSE AEPB Inhale 2 puffs into the lungs 2 (two) times daily.   Yes [provider]  gabapentin (NEURONTIN) 300 MG capsule Take 300 mg by mouth 2 (two) times daily. 08/02/23 09/01/23 Yes [provider]  hydrALAZINE (APRESOLINE) 10 MG tablet Take 5 mg by mouth daily.   Yes [provider]  hydrOXYzine (ATARAX) 25 MG tablet Take  25 mg by mouth daily. Take one-half to 1 tablet by mouth for Pruritus once daily. *May sedate* 01/16/23  Yes [provider]  latanoprost (XALATAN) 0.005 % ophthalmic solution Place 1 drop into both eyes at bedtime. 02/16/22  Yes [provider]  levothyroxine (SYNTHROID) 112 MCG tablet Take 112 mcg by mouth See admin instructions. 6 times week   Yes [provider]  levothyroxine (SYNTHROID) 88 MCG tablet Take 88 mcg by mouth daily. Take 1 tablet by mouth on Monday, Tuesday, Wednesday, Thursday, Friday, and Saturday   Yes [provider]  losartan (COZAAR) 25 MG tablet Take 12.5 mg by mouth daily. 03/08/23  Yes [provider]  mineral oil-hydrophilic petrolatum (AQUAPHOR) ointment Apply 1 Application topically every evening. Prurigo   Yes [provider]  nitroGLYCERIN (NITROSTAT) 0.4 MG SL tablet DISSOLVE 1 TABLET UNDER TONGUE EVERY 5 MINUTES FOR UP TO 3 DOSES AS NEEDED FOR  CHEST PAIN Patient taking differently: Place 0.4 mg under the tongue every 5 (five) minutes as needed for chest pain. 05/13/22  Yes Revankar, Aundra Dubin, MD  omeprazole (PRILOSEC OTC) 20 MG tablet Take 20 mg by mouth 2 (two) times daily.   Yes [provider]  rOPINIRole (REQUIP) 0.5 MG tablet Take 0.5 mg by mouth daily. 05/19/21  Yes [provider]  rOPINIRole (REQUIP) 1 MG tablet Take 1 mg by mouth daily. 08/03/21  Yes [provider]  sertraline (ZOLOFT) 25 MG tablet Take 25 mg by mouth at bedtime. 02/21/23  Yes [provider]  SLOW FE 142 (45 Fe) MG TBCR Take 45 mg by mouth daily. 04/01/21  Yes [provider]  spironolactone (ALDACTONE) 25 MG tablet Take 25 mg by mouth daily.   Yes [provider]  timolol (TIMOPTIC) 0.5 % ophthalmic solution Place 1 drop into both eyes 2 (two) times daily.   Yes [provider]  triamcinolone cream (KENALOG) 0.1 % Apply 1 Application topically daily. Apply to extremities topically once daily, every other day, for itching. *Quarter size* 12/29/22  Yes [provider]  warfarin (COUMADIN) 6 MG tablet Take 6 mg by mouth daily. On hold since 08/16/23, target INR range 2.5-3.5 01/25/22   [provider]    Physical Exam: Vitals:   09-06-23 1630 06-Sep-2023 1700 06-Sep-2023 1742 06-Sep-2023 1830  BP: 138/65 (!) 140/75 134/63 132/68  Pulse: 86 85 87 86  Resp: (!) 21 (!) 25 (!) 26 (!) 30  Temp:      TempSrc:      SpO2: 98% 98% 98% 95%  Weight:      Height:       Constitutional: NAD, calm, comfortable Eyes: PERRL, lids and conjunctivae jaundice ENMT: Mucous membranes are moist. Posterior pharynx clear of any exudate or lesions.Normal dentition.  Neck: normal, supple, no masses, no thyromegaly Respiratory: clear to auscultation bilaterally, no wheezing, no crackles. Normal respiratory effort. No accessory muscle use.  Cardiovascular: Regular rate and rhythm, no murmurs / rubs / gallops. No  extremity edema. 2+ pedal pulses. No carotid bruits.  Abdomen: Distended, very tender abdomen,.  Musculoskeletal: Good range of motion, no joint swelling or tenderness, Skin: no rashes, lesions, ulcers. No induration Neurologic: CN 2-12 grossly intact. Sensation intact, DTR normal. Strength 5/5 in all 4.  Psychiatric: Normal judgment and insight. Alert and oriented x 3. Normal mood  Data Reviewed:  Temperature 97.4, respiratory 30, blood pressure 95/55, sodium 133, potassium 6.0, CO2 15 glucose 116 BUN 56 creatinine 1.88, alkaline phos 189 albumin  3.3 AST 54 3033.  Total protein 8.4.  Troponin was 43.  Lactic acid 4.4 hemoglobin 10.5.  CT abdomen pelvis shows segmental high-grade stenosis or occlusion of the proximal SMA with distal reconstitution, short segment ostial stenosis of the celiac and IMA pneumatosis and mid abdominal small bowel loops with mesenteric ischemia patchy mucosa history of gallbladder containing gas suggesting gangrenous cholecystitis and small bowel of status also patchy airspace opacity in the right middle lobe  Assessment and Plan:  #1 mesenteric ischemia with ischemic bowel: No treatment desired by family and patient.  Surgery consulted but will continue with hospice.  #2 gangrenous cholecystitis: Again no plan for intervention.  #3 coronary artery disease: Stable at baseline.  No treatment at this point we will hold all medications.  #4 lactic acidosis: Secondary to ischemic bowel.  No active treatment  #5 essential hypertension: Continue to hold medications.  #6 supratherapeutic INR: INR of more than 5.  Normal anticoagulation  #7 disposition: Patient has multiple other medical problems but no active treatment desired.  We will consult hospice.  Patient to be transition to hospice care.  He will be DNR.  Will be initiating IV morphine, Ativan, scopolamine.  The total focus will be mainly comfort care.   Advance Care Planning:   Code Status: Do not attempt  resuscitation (DNR) - Comfort care   Consults: General Surgery Dr. Cliffton Asters, hospice  Family Communication: Daughter Dr. Doyce Para  Severity of Illness: The appropriate patient status for this patient is INPATIENT. Inpatient status is judged to be reasonable and necessary in order to provide the required intensity of service to ensure the patient's safety. The patient's presenting symptoms, physical exam findings, and initial radiographic and laboratory data in the context of their chronic comorbidities is felt to place them at high risk for further clinical deterioration. Furthermore, it is not anticipated that the patient will be medically stable for discharge from the hospital within 2 midnights of admission.   * I certify that at the point of admission it is my clinical judgment that the patient will require inpatient hospital care spanning beyond 2 midnights from the point of admission due to high intensity of service, high risk for further deterioration and high frequency of surveillance required.*  AuthorLonia Blood, MD Sep 11, 2023 7:10 PM  For on call review [email or web address].

## 2023-08-26 LAB — CULTURE, BLOOD (ROUTINE X 2)
Culture: NO GROWTH
Culture: NO GROWTH
Special Requests: ADEQUATE

## 2023-09-16 NOTE — Hospital Course (Signed)
Patient was admitted tonight with ischemic bowels and gangrenous cholecystitis.  Surgery was consulted and was deemed not a surgical candidate.  Patient and family opted for comfort measures only.  He was admitted for inpatient hospice.  Hospice was consulted.  Before hospice could see the patient however he declined and basically accorded.  Being a DNR no code was activated.  Patient was on morphine and Ativan prior to his expiration.

## 2023-09-16 NOTE — Progress Notes (Signed)
Sep 06, 2023 2336/10/20  Attending Physican Contact  Attending Physician Notified Y  Attending Physician (First and Last Name) Newton Pigg, MD  Post Mortem Checklist  Date of Death 06-Sep-2023  Time of Death 10-20-2330  Pronounced By Sharen Hones, RN  Next of kin notified Yes  Name of next of kin notified of death Dupree Hyers  Contact Person's Relationship to Patient Daughter  Contact Person's Phone Number [phone]  Family Communication Notes  (Daughter, Gordy Councilman, to pick up patient's watch and wedding ring tomorrow from the hospital safe.)  Was the patient a No Code Blue or a Limited Code Blue? Yes  Did the patient die unattended? Yes  Patient restrained (physical/manual hold/chemical)? Not applicable  Height 5\' 7"  (1.702 m)  Weight 75.7 kg  Body preparation complete Y  [employer] (previously known as Administrator, sports)  Notification Date 09/06/23  Notification Time 10/21/2347  [employer] Number [id number]  Is patient a potential donor? N  Autopsy  Autopsy requested by MD or Family ( Non ME Case) N/A  Patient and Hospital Property Returned  Patient is satisfied that all belongings have been returned? Yes (Daughter, Khory Mazzarese, to pick wedding ring and watch up from hospital safe)  Name of person receiving valuables? Bennetta Laos, daughter  Specify valuables returned Wedding ring and watch  Dermatherapy linen/gowns NOT sent with patient or transporter Disposable Patient Transfer/ Apparel Kit used  Dead on Arrival (Emergency Department)  Patient dead on arrival? No  Notifications  Patient Placement notified that Post Mortem checklist is complete Yes  Patient Placement notified body transferred Transported to morgue  Medical Examiner  Is this a medical examiner's case? N  Funeral Home  Funeral home name/address/phone #  (Family does not know name yet)  Planned location of pickup Morgue   Daughter, Wali Hamacher, notified of patient's death.   Family is trying to locate pre paid crematorium paperwork.  She will call Patient Placement once she knows the name of the crematorium.  Per daughter, ok to send patient to hospital's morgue.  Also, she asks that we send patient's wedding ring and watch to the hospital safe and she will pick up on Monday, 08/17/2023.  All questions and concerns addressed with daughter.  Body prepared and taken to hospital morgue.  Bernie Covey RN

## 2023-09-16 NOTE — Progress Notes (Signed)
Patient found without pulse or respirations. Dr. Arlean Hopping notified. No code called per advance directive and signed DNR order in chart. MD placed order that RN may pronounce death. Time of death 09-25-30, 09/13/2023. Verified by this RN and Bernie Covey, RN. IV removed, post mortem care provided, next of kin notified, belongings (wedding ring/watch) sent to security per daughter Nyeem Stoke request. Body sent to morgue [id number].  Sharen Hones, RN

## 2023-09-16 NOTE — Discharge Summary (Signed)
Physician Discharge Summary   Patient: Peter Beltran MRN: [id number] DOB: [birth year]  Admit date:     August 22, 2023  Discharge date: 09/07/2023  Discharge Physician: Lonia Blood   PCP: Mattie Marlin, MD   Recommendations at discharge:   Patient is deceased  Discharge Diagnoses: Principal Problem:   Ischemic bowel syndrome [facility]) Active Problems:   Coronary artery disease   Hypothyroidism, unspecified   Dyslipidemia   Aortic valve disorder   Diabetic polyneuropathy associated with type 2 diabetes mellitus (HCC)   Gastro-esophageal reflux disease without esophagitis   Obstructive sleep apnea   Essential hypertension   Gangrenous cholecystitis  Resolved Problems:   * No resolved hospital problems. [facility] Course: Patient was admitted tonight with ischemic bowels and gangrenous cholecystitis.  Surgery was consulted and was deemed not a surgical candidate.  Patient and family opted for comfort measures only.  He was admitted for inpatient hospice.  Hospice was consulted.  Before hospice could see the patient however he declined and basically accorded.  Being a DNR no code was activated.  Patient was on morphine and Ativan prior to his expiration.  Assessment and Plan: No notes have been filed under this hospital service. Service: Hospitalist        Consultants: General Surgery Dr. Cliffton Asters Procedures performed: CT abdomen pelvis Disposition:  Deceased Diet recommendation:  Deceased DISCHARGE MEDICATION: Allergies as of 09/13/2023       Reactions   Atorvastatin Calcium    Other Reaction(s): Other (See Comments) Developed myopathy., Muscle pain, Myopathy   Atorvastatin Calcium Other (See Comments)   Developed myopathy.   Dorzolamide Other (See Comments)   Follicula   Brimonidine Other (See Comments)   Red and burning eyes   Brinzolamide-brimonidine Swelling   Redness in eyes - reaction to Simbrinza   Rotigotine Other (See Comments)   Falling asleep  uncontrollably - reaction to Neupro   Tamsulosin Other (See Comments)   Unknown reaction (Flomax)   Tetracyclines & Related    Zolpidem Other (See Comments)   Pt. states that he possibly had a neuro reaction.   Dutasteride Rash   Reaction to Avodart   Vancomycin Rash        Medication List     ASK your doctor about these medications    acetaminophen 325 MG tablet Commonly known as: TYLENOL Take 650 mg by mouth every 6 (six) hours as needed for mild pain or moderate pain. Ask about: Which instructions should I use?   albuterol 108 (90 Base) MCG/ACT inhaler Commonly known as: VENTOLIN HFA Inhale 2 puffs into the lungs every 6 (six) hours as needed for wheezing or shortness of breath.   busPIRone 5 MG tablet Commonly known as: BUSPAR Take 5 mg by mouth 2 (two) times daily.   ergocalciferol 1.25 MG (50000 UT) capsule Commonly known as: VITAMIN D2 Take 50,000 Units by mouth every Tuesday.   fluticasone 50 MCG/ACT nasal spray Commonly known as: FLONASE Place 1 spray into both nostrils in the morning and at bedtime.   Fluticasone-Salmeterol 100-50 MCG/DOSE Aepb Commonly known as: ADVAIR Inhale 2 puffs into the lungs 2 (two) times daily.   gabapentin 300 MG capsule Commonly known as: NEURONTIN Take 300 mg by mouth 2 (two) times daily.   hydrALAZINE 10 MG tablet Commonly known as: APRESOLINE Take 5 mg by mouth daily.   hydrOXYzine 25 MG tablet Commonly known as: ATARAX Take 25 mg by mouth daily. Take one-half to 1 tablet by mouth for Pruritus once daily. *May  sedate*   latanoprost 0.005 % ophthalmic solution Commonly known as: XALATAN Place 1 drop into both eyes at bedtime.   levothyroxine 112 MCG tablet Commonly known as: SYNTHROID Take 112 mcg by mouth See admin instructions. 6 times week Ask about: Which instructions should I use?   levothyroxine 88 MCG tablet Commonly known as: SYNTHROID Take 88 mcg by mouth daily. Take 1 tablet by mouth on Monday,  Tuesday, Wednesday, Thursday, Friday, and Saturday Ask about: Which instructions should I use?   losartan 25 MG tablet Commonly known as: COZAAR Take 12.5 mg by mouth daily.   mineral oil-hydrophilic petrolatum ointment Apply 1 Application topically every evening. Prurigo   omeprazole 20 MG tablet Commonly known as: PRILOSEC OTC Take 20 mg by mouth 2 (two) times daily.   rOPINIRole 0.5 MG tablet Commonly known as: REQUIP Take 0.5 mg by mouth daily.   rOPINIRole 1 MG tablet Commonly known as: REQUIP Take 1 mg by mouth daily.   sertraline 25 MG tablet Commonly known as: ZOLOFT Take 25 mg by mouth at bedtime.   Slow Fe 142 (45 Fe) MG Tbcr Generic drug: Ferrous Sulfate Take 45 mg by mouth daily.   spironolactone 25 MG tablet Commonly known as: ALDACTONE Take 25 mg by mouth daily.   timolol 0.5 % ophthalmic solution Commonly known as: TIMOPTIC Place 1 drop into both eyes 2 (two) times daily.   triamcinolone cream 0.1 % Commonly known as: KENALOG Apply 1 Application topically daily. Apply to extremities topically once daily, every other day, for itching. *Quarter size*   warfarin 6 MG tablet Commonly known as: COUMADIN Take 6 mg by mouth daily. On hold since 08/16/23, target INR range 2.5-3.5 Ask about: Which instructions should I use?        Discharge Exam: Filed Weights   2023-08-30 1156 Aug 30, 2023 2337  Weight: 88.4 kg 75.7 kg   Deceased  Condition at discharge:  Deceased  The results of significant diagnostics from this hospitalization (including imaging, microbiology, ancillary and laboratory) are listed below for reference.   Imaging Studies: CT Angio Abd/Pel W and/or Wo Contrast  Result Date: 08-30-2023 CLINICAL DATA:  Acute abdominal pain, mesenteric ischemia EXAM: CTA ABDOMEN AND PELVIS WITHOUT AND WITH CONTRAST TECHNIQUE: Multidetector CT imaging of the abdomen and pelvis was performed using the standard protocol during bolus administration of  intravenous contrast. Multiplanar reconstructed images and MIPs were obtained and reviewed to evaluate the vascular anatomy. RADIATION DOSE REDUCTION: This exam was performed according to the departmental dose-optimization program which includes automated exposure control, adjustment of the mA and/or kV according to patient size and/or use of iterative reconstruction technique. CONTRAST:  75mL OMNIPAQUE IOHEXOL 350 MG/ML SOLN COMPARISON:  10/05/2021 FINDINGS: VASCULAR Aorta: Moderate calcified atheromatous plaque. No aneurysm, dissection, or stenosis. Celiac: Calcified ostial plaque resulting in short segment stenosis of at least moderate severity, distal branches patent. SMA: Partially calcified ostial plaque over length of at least 2.5 cm resulting in segmental high-grade stenosis or occlusion, patent but atheromatous distally. Renals: Single left, with calcified ostial plaque, no high-grade stenosis. Single right, with calcified ostial plaque resulting in only mild stenosis, patent distally. IMA: Short-segment ostial stenosis, patent distally. Inflow: 6 mild scattered plaque in bilateral common and internal iliac arteries. No stenosis, dissection, or aneurysm. Proximal Outflow: Heavy calcified eccentric plaque in bilateral common femoral arteries without high-grade stenosis. Proximal SFAs patent. Veins: Patent hepatic veins, portal vein, SM V, splenic vein, bilateral renal veins, iliac venous system and IVC. Review of the MIP  images confirms the above findings. NON-VASCULAR Lower chest: No pleural or pericardial effusion. 3-vessel coronary calcifications. Post median sternotomy and AVR. Patchy airspace opacities in the right middle lobe, and posteriorly in the lung bases right greater than left. Hepatobiliary: No focal liver lesion. Pneumobilia in the central nondilated biliary tree, and a small amount of gas in the gallbladder which is physiologically distended with patchy mucosal enhancement. Pancreas: Moderate  parenchymal atrophy without mass, ductal dilatation, or regional inflammatory change. Spleen: Normal in size. Wedge-shaped region of decreased enhancement which persists on venous phase suggesting segmental infarct. Adrenals/Urinary Tract: No adrenal mass. Symmetric renal parenchymal enhancement without urolithiasis or hydronephrosis. Urinary bladder partially distended. Stomach/Bowel: Small hiatal hernia. Stomach is distended by gas and fluid. Proximal small bowel decompressed. Pneumatosis in mid abdominal small bowel loops which are nondilated. There is edema in the adjacent anterior mesentery. Mesenteric venous gas in multiple peripheral branches. Appendix not identified. The colon is incompletely distended, unremarkable. Lymphatic: No abdominal or pelvic adenopathy. Reproductive: Prostate is unremarkable. Other: Small volume pelvic, right pericolic gutter, and perihepatic ascites. No free air. Musculoskeletal: T11 unhealed vertebral compression fracture with approximate 20% loss of height, new since previous. Mild lumbar levoscoliosis with multilevel spondylitic changes. IMPRESSION: 1. 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 2. 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 3. Pneumatosis in mid abdominal small bowel loops with mesenteric venous gas, and edema in the adjacent mesentery, suggesting severe small bowel ischemia. Recommend surgical consultation. 4. Patchy mucosal enhancement of gallbladder, containing gas, suggesting gangrenous cholecystitis. Critical Value/emergent results were called by telephone at the time of interpretation on Aug 26, 2023 at 5:21 pm to provider [facility], who verbally acknowledged these results. 5. Small volume ascites. 6. Splenic infarct. 7. T11 vertebral compression fracture, new since previous. 8. Patchy airspace opacities in the right middle lobe, and posteriorly in the lung bases right greater than left. 9.  Aortic  Atherosclerosis (ICD10-I70.0). Electronically Signed   By: Corlis Leak M.D.   On: 26-Aug-2023 17:22   DG Chest Portable 1 View  Result Date: 2023-08-26 CLINICAL DATA:  Upper abdominal pain EXAM: PORTABLE CHEST 1 VIEW COMPARISON:  10/05/2021 FINDINGS: 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 Both lungs are clear. The visualized skeletal structures are unremarkable. IMPRESSION: No acute abnormality of the lungs in AP portable projection. Electronically Signed   By: Jearld Lesch M.D.   On: August 26, 2023 13:25    Microbiology: Results for orders placed or performed during the hospital encounter of 08/26/2023  Blood culture (routine x 2)     Status: None (Preliminary result)   Collection Time: Aug 26, 2023  5:43 PM   Specimen: BLOOD RIGHT ARM  Result Value Ref Range Status   Specimen Description BLOOD RIGHT ARM  Final   Special Requests   Final    BOTTLES DRAWN AEROBIC AND ANAEROBIC Blood Culture results may not be optimal due to an excessive volume of blood received in culture bottles   Culture   Final    NO GROWTH < 24 HOURS Performed at [facility], [address], [place], [place] [address]    Report Status PENDING  Incomplete  Blood culture (routine x 2)     Status: None (Preliminary result)   Collection Time: 2023-08-26  5:43 PM   Specimen: BLOOD LEFT ARM  Result Value Ref Range Status   Specimen Description BLOOD LEFT ARM  Final   Special Requests   Final    BOTTLES  DRAWN AEROBIC AND ANAEROBIC Blood Culture adequate volume   Culture   Final    NO GROWTH < 24 HOURS Performed at [facility], [address] [place], [place] [address]    Report Status PENDING  Incomplete    Labs: CBC: Recent Labs  Lab 09/09/2023 1212 08/24/2023 1433  WBC 37.4*  --   NEUTROABS 35.9*  --   HGB 11.2* 10.5*  HCT 34.7* 31.0*  MCV 92.0  --   PLT 387  --    Basic Metabolic Panel: Recent Labs  Lab 08/26/2023 1313 08/20/2023 1433  NA 133* 137  K 6.0* 5.6*  CL 103 110  CO2  15*  --   GLUCOSE 160* 146*  BUN 56* 53*  CREATININE 1.88* 1.60*  CALCIUM 9.1  --    Liver Function Tests: Recent Labs  Lab 09/05/2023 1313  AST 54*  ALT 33  ALKPHOS 189*  BILITOT 2.8*  PROT 8.4*  ALBUMIN 3.3*   CBG: No results for input(s): "GLUCAP" in the last 168 hours.  Discharge time spent: less than 30 minutes.  SignedLonia Blood, MD [facility] 09-12-23

## 2023-09-16 DEATH — deceased
